# Patient Record
Sex: Female | Born: 1960 | Race: Black or African American | Hispanic: No | Smoking: Current some day smoker
Health system: Southern US, Community
[De-identification: ages and names within clinical notes are randomized; demographics above are authoritative.]

## PROBLEM LIST (undated history)

## (undated) DIAGNOSIS — J45909 Unspecified asthma, uncomplicated: Secondary | ICD-10-CM

## (undated) DIAGNOSIS — K219 Gastro-esophageal reflux disease without esophagitis: Secondary | ICD-10-CM

## (undated) DIAGNOSIS — I10 Essential (primary) hypertension: Secondary | ICD-10-CM

## (undated) DIAGNOSIS — T7840XA Allergy, unspecified, initial encounter: Secondary | ICD-10-CM

## (undated) DIAGNOSIS — N939 Abnormal uterine and vaginal bleeding, unspecified: Secondary | ICD-10-CM

## (undated) DIAGNOSIS — K3184 Gastroparesis: Secondary | ICD-10-CM

## (undated) DIAGNOSIS — F419 Anxiety disorder, unspecified: Secondary | ICD-10-CM

## (undated) DIAGNOSIS — E119 Type 2 diabetes mellitus without complications: Secondary | ICD-10-CM

## (undated) DIAGNOSIS — R739 Hyperglycemia, unspecified: Secondary | ICD-10-CM

## (undated) HISTORY — DX: Anxiety disorder, unspecified: F41.9

## (undated) HISTORY — PX: BREAST SURGERY: SHX581

## (undated) HISTORY — DX: Abnormal uterine and vaginal bleeding, unspecified: N93.9

## (undated) HISTORY — DX: Allergy, unspecified, initial encounter: T78.40XA

---

## 1997-07-30 ENCOUNTER — Other Ambulatory Visit: Admission: RE | Admit: 1997-07-30 | Discharge: 1997-07-30 | Payer: Self-pay | Admitting: Family Medicine

## 1997-08-08 ENCOUNTER — Other Ambulatory Visit: Admission: RE | Admit: 1997-08-08 | Discharge: 1997-08-08 | Payer: Self-pay | Admitting: Family Medicine

## 1998-02-01 ENCOUNTER — Emergency Department (HOSPITAL_COMMUNITY): Admission: EM | Admit: 1998-02-01 | Discharge: 1998-02-01 | Payer: Self-pay | Admitting: Emergency Medicine

## 1998-07-21 ENCOUNTER — Encounter: Admission: RE | Admit: 1998-07-21 | Discharge: 1998-10-19 | Payer: Self-pay

## 1999-09-21 ENCOUNTER — Ambulatory Visit (HOSPITAL_COMMUNITY): Admission: RE | Admit: 1999-09-21 | Discharge: 1999-09-21 | Payer: Self-pay | Admitting: *Deleted

## 2003-11-21 ENCOUNTER — Ambulatory Visit: Payer: Self-pay | Admitting: Family Medicine

## 2003-12-08 ENCOUNTER — Ambulatory Visit: Payer: Self-pay | Admitting: Family Medicine

## 2003-12-15 ENCOUNTER — Ambulatory Visit: Payer: Self-pay | Admitting: Family Medicine

## 2004-07-01 ENCOUNTER — Ambulatory Visit: Payer: Self-pay | Admitting: Family Medicine

## 2004-07-23 ENCOUNTER — Other Ambulatory Visit: Admission: RE | Admit: 2004-07-23 | Discharge: 2004-07-23 | Payer: Self-pay | Admitting: Family Medicine

## 2004-07-23 ENCOUNTER — Ambulatory Visit: Payer: Self-pay | Admitting: Family Medicine

## 2005-04-14 ENCOUNTER — Ambulatory Visit: Payer: Self-pay | Admitting: Family Medicine

## 2005-04-14 ENCOUNTER — Encounter (INDEPENDENT_AMBULATORY_CARE_PROVIDER_SITE_OTHER): Payer: Self-pay | Admitting: *Deleted

## 2005-04-14 ENCOUNTER — Other Ambulatory Visit: Admission: RE | Admit: 2005-04-14 | Discharge: 2005-04-14 | Payer: Self-pay | Admitting: Family Medicine

## 2005-04-25 ENCOUNTER — Ambulatory Visit: Payer: Self-pay | Admitting: Family Medicine

## 2005-06-01 ENCOUNTER — Other Ambulatory Visit: Admission: RE | Admit: 2005-06-01 | Discharge: 2005-06-01 | Payer: Self-pay | Admitting: *Deleted

## 2005-06-01 ENCOUNTER — Ambulatory Visit: Payer: Self-pay | Admitting: *Deleted

## 2005-06-22 ENCOUNTER — Ambulatory Visit (HOSPITAL_COMMUNITY): Admission: RE | Admit: 2005-06-22 | Discharge: 2005-06-22 | Payer: Self-pay | Admitting: Obstetrics and Gynecology

## 2005-06-22 ENCOUNTER — Ambulatory Visit: Payer: Self-pay | Admitting: *Deleted

## 2005-08-03 ENCOUNTER — Ambulatory Visit: Payer: Self-pay | Admitting: Internal Medicine

## 2005-09-05 ENCOUNTER — Ambulatory Visit: Payer: Self-pay | Admitting: Family Medicine

## 2005-10-26 ENCOUNTER — Ambulatory Visit: Payer: Self-pay | Admitting: Family Medicine

## 2005-10-28 ENCOUNTER — Ambulatory Visit: Payer: Self-pay | Admitting: Family Medicine

## 2005-11-16 ENCOUNTER — Other Ambulatory Visit: Admission: RE | Admit: 2005-11-16 | Discharge: 2005-11-16 | Payer: Self-pay | Admitting: Obstetrics & Gynecology

## 2005-11-16 ENCOUNTER — Ambulatory Visit: Payer: Self-pay | Admitting: *Deleted

## 2005-11-28 ENCOUNTER — Encounter (INDEPENDENT_AMBULATORY_CARE_PROVIDER_SITE_OTHER): Payer: Self-pay | Admitting: Family Medicine

## 2005-12-01 ENCOUNTER — Ambulatory Visit: Payer: Self-pay | Admitting: Obstetrics and Gynecology

## 2005-12-05 ENCOUNTER — Ambulatory Visit: Payer: Self-pay | Admitting: Family Medicine

## 2006-01-12 ENCOUNTER — Ambulatory Visit: Payer: Self-pay | Admitting: Family Medicine

## 2006-08-11 ENCOUNTER — Encounter (INDEPENDENT_AMBULATORY_CARE_PROVIDER_SITE_OTHER): Payer: Self-pay | Admitting: Family Medicine

## 2006-08-11 DIAGNOSIS — G479 Sleep disorder, unspecified: Secondary | ICD-10-CM | POA: Insufficient documentation

## 2006-08-11 DIAGNOSIS — J45909 Unspecified asthma, uncomplicated: Secondary | ICD-10-CM

## 2006-08-11 DIAGNOSIS — B977 Papillomavirus as the cause of diseases classified elsewhere: Secondary | ICD-10-CM

## 2006-08-11 DIAGNOSIS — K219 Gastro-esophageal reflux disease without esophagitis: Secondary | ICD-10-CM

## 2006-08-11 DIAGNOSIS — I10 Essential (primary) hypertension: Secondary | ICD-10-CM

## 2006-08-11 DIAGNOSIS — E119 Type 2 diabetes mellitus without complications: Secondary | ICD-10-CM

## 2006-08-11 DIAGNOSIS — F3289 Other specified depressive episodes: Secondary | ICD-10-CM | POA: Insufficient documentation

## 2006-08-11 DIAGNOSIS — F411 Generalized anxiety disorder: Secondary | ICD-10-CM

## 2006-08-11 DIAGNOSIS — J309 Allergic rhinitis, unspecified: Secondary | ICD-10-CM | POA: Insufficient documentation

## 2006-08-11 DIAGNOSIS — F329 Major depressive disorder, single episode, unspecified: Secondary | ICD-10-CM | POA: Insufficient documentation

## 2006-09-04 ENCOUNTER — Ambulatory Visit: Payer: Self-pay | Admitting: Family Medicine

## 2006-10-11 ENCOUNTER — Encounter (INDEPENDENT_AMBULATORY_CARE_PROVIDER_SITE_OTHER): Payer: Self-pay | Admitting: Family Medicine

## 2006-10-11 ENCOUNTER — Ambulatory Visit: Payer: Self-pay | Admitting: Internal Medicine

## 2006-10-11 LAB — CONVERTED CEMR LAB
BUN: 12 mg/dL (ref 6–23)
CO2: 25 meq/L (ref 19–32)
Calcium: 9.1 mg/dL (ref 8.4–10.5)
Chloride: 104 meq/L (ref 96–112)
Glucose, Bld: 152 mg/dL — ABNORMAL HIGH (ref 70–99)
Potassium: 3.6 meq/L (ref 3.5–5.3)
Sodium: 141 meq/L (ref 135–145)

## 2007-01-19 ENCOUNTER — Ambulatory Visit: Payer: Self-pay | Admitting: Internal Medicine

## 2007-05-03 ENCOUNTER — Encounter (INDEPENDENT_AMBULATORY_CARE_PROVIDER_SITE_OTHER): Payer: Self-pay | Admitting: Family Medicine

## 2007-05-03 ENCOUNTER — Ambulatory Visit: Payer: Self-pay | Admitting: Internal Medicine

## 2007-05-03 LAB — CONVERTED CEMR LAB: Chlamydia, Swab/Urine, PCR: NEGATIVE

## 2007-05-29 ENCOUNTER — Ambulatory Visit: Payer: Self-pay | Admitting: Internal Medicine

## 2007-05-31 ENCOUNTER — Ambulatory Visit: Payer: Self-pay | Admitting: Internal Medicine

## 2007-09-03 ENCOUNTER — Encounter: Payer: Self-pay | Admitting: Family Medicine

## 2007-09-03 ENCOUNTER — Other Ambulatory Visit: Admission: RE | Admit: 2007-09-03 | Discharge: 2007-09-03 | Payer: Self-pay | Admitting: Family Medicine

## 2007-09-03 ENCOUNTER — Ambulatory Visit: Payer: Self-pay | Admitting: Internal Medicine

## 2007-09-03 LAB — CONVERTED CEMR LAB: GC Probe Amp, Genital: NEGATIVE

## 2007-10-10 ENCOUNTER — Encounter: Payer: Self-pay | Admitting: Family Medicine

## 2007-10-10 ENCOUNTER — Ambulatory Visit: Payer: Self-pay | Admitting: Internal Medicine

## 2007-10-10 LAB — CONVERTED CEMR LAB
AST: 12 units/L (ref 0–37)
Alkaline Phosphatase: 38 units/L — ABNORMAL LOW (ref 39–117)
BUN: 19 mg/dL (ref 6–23)
Basophils Relative: 0 % (ref 0–1)
Calcium: 9.3 mg/dL (ref 8.4–10.5)
Chloride: 105 meq/L (ref 96–112)
Eosinophils Absolute: 0.2 10*3/uL (ref 0.0–0.7)
Eosinophils Relative: 3 % (ref 0–5)
FSH: 11.1 milliintl units/mL
Glucose, Bld: 145 mg/dL — ABNORMAL HIGH (ref 70–99)
HDL: 50 mg/dL (ref >39)
LDL Cholesterol: 70 mg/dL (ref 0–99)
Lymphs Abs: 2.9 10*3/uL (ref 0.7–4.0)
Monocytes Absolute: 0.4 10*3/uL (ref 0.1–1.0)
Monocytes Relative: 8 % (ref 3–12)
Neutro Abs: 2.1 10*3/uL (ref 1.7–7.7)
Neutrophils Relative %: 37 % — ABNORMAL LOW (ref 43–77)
Sodium: 141 meq/L (ref 135–145)
TSH: 1.344 microintl units/mL (ref 0.350–4.50)
Total CHOL/HDL Ratio: 3.1
Total Protein: 7.2 g/dL (ref 6.0–8.3)
VLDL: 36 mg/dL (ref 0–40)

## 2007-10-17 ENCOUNTER — Ambulatory Visit: Payer: Self-pay | Admitting: Internal Medicine

## 2007-11-07 ENCOUNTER — Encounter: Payer: Self-pay | Admitting: Family Medicine

## 2007-11-07 ENCOUNTER — Ambulatory Visit: Payer: Self-pay | Admitting: Internal Medicine

## 2007-11-07 LAB — CONVERTED CEMR LAB
Lymphocytes Relative: 19 % (ref 12–46)
MCV: 79.1 fL (ref 78.0–100.0)
Monocytes Absolute: 0.6 10*3/uL (ref 0.1–1.0)
Neutrophils Relative %: 63 % (ref 43–77)
RBC: 4.49 M/uL (ref 3.87–5.11)
RDW: 18.2 % — ABNORMAL HIGH (ref 11.5–15.5)
WBC: 3.6 10*3/uL — ABNORMAL LOW (ref 4.0–10.5)

## 2007-11-28 ENCOUNTER — Ambulatory Visit: Payer: Self-pay | Admitting: Family Medicine

## 2007-12-06 ENCOUNTER — Ambulatory Visit: Payer: Self-pay | Admitting: Family Medicine

## 2008-01-15 ENCOUNTER — Encounter: Payer: Self-pay | Admitting: Family Medicine

## 2008-01-15 ENCOUNTER — Ambulatory Visit: Payer: Self-pay | Admitting: Internal Medicine

## 2008-01-15 LAB — CONVERTED CEMR LAB
Eosinophils Absolute: 0.2 10*3/uL (ref 0.0–0.7)
Hemoglobin: 10.8 g/dL — ABNORMAL LOW (ref 12.0–15.0)
MCHC: 30.7 g/dL (ref 30.0–36.0)
Monocytes Absolute: 0.6 10*3/uL (ref 0.1–1.0)
Monocytes Relative: 7 % (ref 3–12)
Neutrophils Relative %: 44 % (ref 43–77)
Platelets: 403 10*3/uL — ABNORMAL HIGH (ref 150–400)
RBC: 4.38 M/uL (ref 3.87–5.11)

## 2008-01-29 ENCOUNTER — Ambulatory Visit (HOSPITAL_BASED_OUTPATIENT_CLINIC_OR_DEPARTMENT_OTHER): Admission: RE | Admit: 2008-01-29 | Discharge: 2008-01-29 | Payer: Self-pay | Admitting: Internal Medicine

## 2008-02-03 ENCOUNTER — Ambulatory Visit: Payer: Self-pay | Admitting: Internal Medicine

## 2008-02-20 ENCOUNTER — Ambulatory Visit: Payer: Self-pay | Admitting: Family Medicine

## 2008-03-13 ENCOUNTER — Ambulatory Visit: Payer: Self-pay | Admitting: Internal Medicine

## 2008-04-30 ENCOUNTER — Ambulatory Visit: Payer: Self-pay | Admitting: Internal Medicine

## 2008-04-30 ENCOUNTER — Encounter: Payer: Self-pay | Admitting: Family Medicine

## 2008-04-30 LAB — CONVERTED CEMR LAB: Microalb, Ur: 2.76 mg/dL — ABNORMAL HIGH (ref 0.00–1.89)

## 2008-05-07 ENCOUNTER — Ambulatory Visit (HOSPITAL_COMMUNITY): Admission: RE | Admit: 2008-05-07 | Discharge: 2008-05-07 | Payer: Self-pay | Admitting: Family Medicine

## 2008-05-19 ENCOUNTER — Ambulatory Visit: Payer: Self-pay | Admitting: Family Medicine

## 2008-06-30 ENCOUNTER — Ambulatory Visit: Payer: Self-pay | Admitting: Internal Medicine

## 2008-07-29 ENCOUNTER — Encounter: Payer: Self-pay | Admitting: Family Medicine

## 2008-07-29 ENCOUNTER — Ambulatory Visit: Payer: Self-pay | Admitting: Internal Medicine

## 2008-07-29 LAB — CONVERTED CEMR LAB
CO2: 28 meq/L (ref 19–32)
Calcium: 9.2 mg/dL (ref 8.4–10.5)
Chloride: 100 meq/L (ref 96–112)
Creatinine, Ser: 0.88 mg/dL (ref 0.40–1.20)
Eosinophils Relative: 2 % (ref 0–5)
Glucose, Bld: 127 mg/dL — ABNORMAL HIGH (ref 70–99)
HCT: 41.2 % (ref 36.0–46.0)
Hemoglobin: 11.8 g/dL — ABNORMAL LOW (ref 12.0–15.0)
Lymphocytes Relative: 52 % — ABNORMAL HIGH (ref 12–46)
MCHC: 28.6 g/dL — ABNORMAL LOW (ref 30.0–36.0)
MCV: 89.6 fL (ref 78.0–100.0)
Microalb, Ur: 0.96 mg/dL (ref 0.00–1.89)
Neutrophils Relative %: 38 % — ABNORMAL LOW (ref 43–77)
RBC: 4.6 M/uL (ref 3.87–5.11)
Total Bilirubin: 0.4 mg/dL (ref 0.3–1.2)
Total Protein: 7.3 g/dL (ref 6.0–8.3)
WBC: 5.1 10*3/uL (ref 4.0–10.5)

## 2008-08-04 ENCOUNTER — Ambulatory Visit: Payer: Self-pay | Admitting: Internal Medicine

## 2009-01-02 ENCOUNTER — Ambulatory Visit: Payer: Self-pay | Admitting: Family Medicine

## 2009-03-25 ENCOUNTER — Encounter (INDEPENDENT_AMBULATORY_CARE_PROVIDER_SITE_OTHER): Payer: Self-pay | Admitting: Adult Health

## 2009-03-25 ENCOUNTER — Ambulatory Visit: Payer: Self-pay | Admitting: Family Medicine

## 2009-03-25 LAB — CONVERTED CEMR LAB
ALT: 24 units/L (ref 0–35)
AST: 27 units/L (ref 0–37)
BUN: 13 mg/dL (ref 6–23)
Basophils Absolute: 0 10*3/uL (ref 0.0–0.1)
Cholesterol: 217 mg/dL — ABNORMAL HIGH (ref 0–200)
Eosinophils Absolute: 0.1 10*3/uL (ref 0.0–0.7)
Helicobacter Pylori Antibody-IgG: <0.4
Hemoglobin: 14.9 g/dL (ref 12.0–15.0)
LDL Cholesterol: 145 mg/dL — ABNORMAL HIGH (ref 0–99)
Lymphocytes Relative: 45 % (ref 12–46)
MCHC: 33.2 g/dL (ref 30.0–36.0)
MCV: 91.8 fL (ref 78.0–100.0)
Monocytes Absolute: 0.5 10*3/uL (ref 0.1–1.0)
Monocytes Relative: 11 % (ref 3–12)
Neutrophils Relative %: 42 % — ABNORMAL LOW (ref 43–77)
Potassium: 3.4 meq/L — ABNORMAL LOW (ref 3.5–5.3)
RBC: 4.89 M/uL (ref 3.87–5.11)
Sodium: 141 meq/L (ref 135–145)
Total CHOL/HDL Ratio: 4.8
Total Protein: 7.3 g/dL (ref 6.0–8.3)
WBC: 4.2 10*3/uL (ref 4.0–10.5)

## 2009-03-26 ENCOUNTER — Encounter (INDEPENDENT_AMBULATORY_CARE_PROVIDER_SITE_OTHER): Payer: Self-pay | Admitting: Adult Health

## 2009-03-27 ENCOUNTER — Encounter (INDEPENDENT_AMBULATORY_CARE_PROVIDER_SITE_OTHER): Payer: Self-pay | Admitting: Adult Health

## 2009-07-17 ENCOUNTER — Ambulatory Visit: Payer: Self-pay | Admitting: Family Medicine

## 2009-07-17 ENCOUNTER — Encounter (INDEPENDENT_AMBULATORY_CARE_PROVIDER_SITE_OTHER): Payer: Self-pay | Admitting: Adult Health

## 2009-09-15 ENCOUNTER — Other Ambulatory Visit: Admission: RE | Admit: 2009-09-15 | Discharge: 2009-09-15 | Payer: Self-pay | Admitting: Adult Health

## 2009-09-15 ENCOUNTER — Ambulatory Visit: Payer: Self-pay | Admitting: Internal Medicine

## 2009-09-15 ENCOUNTER — Encounter (INDEPENDENT_AMBULATORY_CARE_PROVIDER_SITE_OTHER): Payer: Self-pay | Admitting: Adult Health

## 2009-09-15 LAB — CONVERTED CEMR LAB
AST: 25 units/L (ref 0–37)
Alkaline Phosphatase: 59 units/L (ref 39–117)
Basophils Absolute: 0 10*3/uL (ref 0.0–0.1)
Basophils Relative: 0 % (ref 0–1)
CO2: 30 meq/L (ref 19–32)
Calcium: 9.4 mg/dL (ref 8.4–10.5)
Chlamydia, DNA Probe: NEGATIVE
Creatinine, Ser: 1.04 mg/dL (ref 0.40–1.20)
Eosinophils Absolute: 0.1 10*3/uL (ref 0.0–0.7)
Eosinophils Relative: 2 % (ref 0–5)
Glucose, Bld: 131 mg/dL — ABNORMAL HIGH (ref 70–99)
LH: 34.6 milliintl units/mL
Lymphocytes Relative: 50 % — ABNORMAL HIGH (ref 12–46)
MCHC: 31.9 g/dL (ref 30.0–36.0)
Monocytes Absolute: 0.4 10*3/uL (ref 0.1–1.0)
Neutrophils Relative %: 41 % — ABNORMAL LOW (ref 43–77)
Platelets: 271 10*3/uL (ref 150–400)
RBC: 4.77 M/uL (ref 3.87–5.11)
TSH: 0.472 microintl units/mL (ref 0.350–4.500)
Total Protein: 7.5 g/dL (ref 6.0–8.3)
VLDL: 31 mg/dL (ref 0–40)

## 2009-09-28 ENCOUNTER — Ambulatory Visit (HOSPITAL_COMMUNITY): Admission: RE | Admit: 2009-09-28 | Discharge: 2009-09-28 | Payer: Self-pay | Admitting: Internal Medicine

## 2009-10-08 ENCOUNTER — Encounter: Admission: RE | Admit: 2009-10-08 | Discharge: 2009-10-08 | Payer: Self-pay | Admitting: Internal Medicine

## 2009-10-12 ENCOUNTER — Ambulatory Visit: Payer: Self-pay | Admitting: Family Medicine

## 2009-10-14 ENCOUNTER — Ambulatory Visit: Payer: Self-pay | Admitting: Internal Medicine

## 2009-10-29 ENCOUNTER — Ambulatory Visit: Payer: Self-pay | Admitting: Internal Medicine

## 2009-10-29 ENCOUNTER — Encounter (INDEPENDENT_AMBULATORY_CARE_PROVIDER_SITE_OTHER): Payer: Self-pay | Admitting: Family Medicine

## 2009-10-29 LAB — CONVERTED CEMR LAB
ALT: 15 units/L (ref 0–35)
Albumin: 4.4 g/dL (ref 3.5–5.2)
BUN: 13 mg/dL (ref 6–23)
CO2: 31 meq/L (ref 19–32)
Calcium: 8.9 mg/dL (ref 8.4–10.5)
Chloride: 101 meq/L (ref 96–112)
Cholesterol: 204 mg/dL — ABNORMAL HIGH (ref 0–200)
Creatinine, Ser: 0.82 mg/dL (ref 0.40–1.20)
Glucose, Bld: 182 mg/dL — ABNORMAL HIGH (ref 70–99)
HDL: 42 mg/dL (ref >39)
LDL Cholesterol: 136 mg/dL — ABNORMAL HIGH (ref 0–99)
Sodium: 143 meq/L (ref 135–145)
Total Bilirubin: 0.5 mg/dL (ref 0.3–1.2)

## 2009-11-12 ENCOUNTER — Encounter: Payer: Self-pay | Admitting: Obstetrics and Gynecology

## 2009-11-12 ENCOUNTER — Ambulatory Visit: Payer: Self-pay | Admitting: Obstetrics & Gynecology

## 2009-11-12 LAB — CONVERTED CEMR LAB: GC Probe Amp, Genital: NEGATIVE

## 2009-11-13 ENCOUNTER — Encounter: Payer: Self-pay | Admitting: Obstetrics and Gynecology

## 2009-11-13 LAB — CONVERTED CEMR LAB: Yeast Wet Prep HPF POC: NONE SEEN

## 2009-11-23 ENCOUNTER — Encounter (INDEPENDENT_AMBULATORY_CARE_PROVIDER_SITE_OTHER): Payer: Self-pay | Admitting: *Deleted

## 2009-11-23 LAB — CONVERTED CEMR LAB
AST: 22 units/L (ref 0–37)
BUN: 13 mg/dL (ref 6–23)
Glucose, Bld: 167 mg/dL — ABNORMAL HIGH (ref 70–99)
Potassium: 2.8 meq/L — ABNORMAL LOW (ref 3.5–5.3)
Total Bilirubin: 0.4 mg/dL (ref 0.3–1.2)
Total Protein: 7.2 g/dL (ref 6.0–8.3)

## 2009-12-09 ENCOUNTER — Encounter (INDEPENDENT_AMBULATORY_CARE_PROVIDER_SITE_OTHER): Payer: Self-pay | Admitting: *Deleted

## 2009-12-09 LAB — CONVERTED CEMR LAB
BUN: 14 mg/dL (ref 6–23)
CO2: 31 meq/L (ref 19–32)
Calcium: 9.6 mg/dL (ref 8.4–10.5)
Cholesterol: 125 mg/dL (ref 0–200)
Glucose, Bld: 151 mg/dL — ABNORMAL HIGH (ref 70–99)
LDL Cholesterol: 62 mg/dL (ref 0–99)
Potassium: 3.9 meq/L (ref 3.5–5.3)
Sodium: 143 meq/L (ref 135–145)
Total CHOL/HDL Ratio: 2.8
Triglycerides: 88 mg/dL (ref <150)
VLDL: 18 mg/dL (ref 0–40)

## 2010-03-21 ENCOUNTER — Encounter: Payer: Self-pay | Admitting: *Deleted

## 2010-03-21 ENCOUNTER — Encounter: Payer: Self-pay | Admitting: Internal Medicine

## 2010-03-21 ENCOUNTER — Encounter: Payer: Self-pay | Admitting: Obstetrics and Gynecology

## 2010-06-10 ENCOUNTER — Emergency Department (HOSPITAL_COMMUNITY)
Admission: EM | Admit: 2010-06-10 | Discharge: 2010-06-11 | Disposition: A | Discharge status: Home / Self Care | Payer: Medicaid Other | Attending: Emergency Medicine | Admitting: Emergency Medicine

## 2010-06-10 ENCOUNTER — Emergency Department (HOSPITAL_COMMUNITY): Payer: Medicaid Other

## 2010-06-10 DIAGNOSIS — R509 Fever, unspecified: Secondary | ICD-10-CM | POA: Insufficient documentation

## 2010-06-10 DIAGNOSIS — B9789 Other viral agents as the cause of diseases classified elsewhere: Secondary | ICD-10-CM | POA: Insufficient documentation

## 2010-06-10 DIAGNOSIS — I10 Essential (primary) hypertension: Secondary | ICD-10-CM | POA: Insufficient documentation

## 2010-06-10 DIAGNOSIS — R0989 Other specified symptoms and signs involving the circulatory and respiratory systems: Secondary | ICD-10-CM | POA: Insufficient documentation

## 2010-06-10 DIAGNOSIS — K219 Gastro-esophageal reflux disease without esophagitis: Secondary | ICD-10-CM | POA: Insufficient documentation

## 2010-06-10 DIAGNOSIS — R109 Unspecified abdominal pain: Secondary | ICD-10-CM | POA: Insufficient documentation

## 2010-06-10 DIAGNOSIS — R059 Cough, unspecified: Secondary | ICD-10-CM | POA: Insufficient documentation

## 2010-06-10 DIAGNOSIS — R05 Cough: Secondary | ICD-10-CM | POA: Insufficient documentation

## 2010-06-10 DIAGNOSIS — R0609 Other forms of dyspnea: Secondary | ICD-10-CM | POA: Insufficient documentation

## 2010-06-10 DIAGNOSIS — E119 Type 2 diabetes mellitus without complications: Secondary | ICD-10-CM | POA: Insufficient documentation

## 2010-06-10 DIAGNOSIS — R1110 Vomiting, unspecified: Secondary | ICD-10-CM | POA: Insufficient documentation

## 2010-06-10 DIAGNOSIS — E7800 Pure hypercholesterolemia, unspecified: Secondary | ICD-10-CM | POA: Insufficient documentation

## 2010-06-10 LAB — COMPREHENSIVE METABOLIC PANEL
ALT: 20 U/L (ref 0–35)
AST: 21 U/L (ref 0–37)
Albumin: 4 g/dL (ref 3.5–5.2)
BUN: 11 mg/dL (ref 6–23)
CO2: 30 mEq/L (ref 19–32)
Chloride: 93 mEq/L — ABNORMAL LOW (ref 96–112)
GFR calc non Af Amer: 54 mL/min — ABNORMAL LOW (ref >60)
Glucose, Bld: 159 mg/dL — ABNORMAL HIGH (ref 70–99)
Potassium: 3.2 mEq/L — ABNORMAL LOW (ref 3.5–5.1)
Total Protein: 7.3 g/dL (ref 6.0–8.3)

## 2010-06-10 LAB — URINALYSIS, ROUTINE W REFLEX MICROSCOPIC
Bilirubin Urine: NEGATIVE
Glucose, UA: NEGATIVE mg/dL
Ketones, ur: NEGATIVE mg/dL
Nitrite: NEGATIVE
Protein, ur: NEGATIVE mg/dL
Urobilinogen, UA: 0.2 mg/dL (ref 0.0–1.0)
pH: 5.5 (ref 5.0–8.0)

## 2010-06-10 LAB — URINE MICROSCOPIC-ADD ON

## 2010-06-10 LAB — CBC
HCT: 35.6 % — ABNORMAL LOW (ref 36.0–46.0)
MCH: 30.5 pg (ref 26.0–34.0)
MCV: 89.7 fL (ref 78.0–100.0)
Platelets: 228 10*3/uL (ref 150–400)
RBC: 3.97 MIL/uL (ref 3.87–5.11)
WBC: 13 10*3/uL — ABNORMAL HIGH (ref 4.0–10.5)

## 2010-06-10 LAB — DIFFERENTIAL
Basophils Absolute: 0 10*3/uL (ref 0.0–0.1)
Monocytes Relative: 5 % (ref 3–12)

## 2010-06-12 LAB — URINE CULTURE: Culture  Setup Time: 201204122319

## 2010-07-13 NOTE — Procedures (Signed)
NAME:  Kelly Santana, Kelly Santana               ACCOUNT NO.:  [id number]   MEDICAL RECORD NO.:  [id number]          PATIENT TYPE:  OUT   LOCATION:  SLEEP CENTER                 FACILITY:  [facility]   PHYSICIAN:  Clinton D. Maple Hudson, MD, FCCP, FACPDATE OF BIRTH:  04/27/1961   DATE OF STUDY:  01/29/2008                            NOCTURNAL POLYSOMNOGRAM   REFERRING PHYSICIAN:  Sharin Grave, MD   REFERRING PHYSICIAN:  Sharin Grave, MD   INDICATION FOR STUDY:  Insomnia with sleep apnea.   EPWORTH SLEEPINESS SCORE:  11/24.  BMI 31.8.  Weight 185 pounds.  Height  64 inches.  Neck 15 inches.   HOME MEDICATIONS:  Charted and reviewed.   SLEEP ARCHITECTURE:  Total sleep time 343 minutes with sleep efficiency  84.3%.  Stage I was 7.9%.  Stage II 69.9%.  Stage III absent.  REM 22.2%  of total sleep time.  Sleep latency 13 minutes.  REM latency 75 minutes.  Awake after sleep onset 50 minutes.  Arousal index 3.7.   RESPIRATORY DATA:  Apnea-hypopnea index (AHI) 4.9 per hour.  A total of  28 events were scored, all as hypopneas.  Most events were while  sleeping supine.  REM AHI 16.5.  There were insufficient events to  permit CPAP titration by split-study protocol on the study night.   OXYGEN DATA:  Loud snoring with oxygen desaturation to a nadir of 82%.  Mean oxygenation saturation through the study 94.9% on room air.   CARDIAC DATA:  Normal sinus rhythm.   MOVEMENT/PARASOMNIA:  No significant limb movement disturbance.  Bathroom x1.  She slept without her mouthguard and complained of  headache upon waking in the morning.   IMPRESSION/RECOMMENDATION:  1. Occasional respiratory events with sleep disturbance, apnea-      hypopnea index 4.9 per hour (normal range 0-5 per hour).  Events      were more common while sleeping supine, suggesting benefit from      encouragement to sleep on flat back.  Snoring was loud with oxygen      desaturation to a nadir of 82%.  No significant time was recorded    with oxygen saturation less than 88% on room air.  2. She states that she usually wears a mouthguard, but did not wear it      on the study night and stated consequently she woke with headache      in the morning.      Clinton D. Maple Hudson, MD, [facility], FACP  Diplomate, Biomedical engineer of Sleep Medicine  Electronically Signed     CDY/MEDQ  D:  02/03/2008 14:27:51  T:  02/04/2008 00:39:19  Job:  [id number]

## 2010-07-16 NOTE — Group Therapy Note (Signed)
NAME:  Kelly Santana, Kelly Santana NO.:  [id number]   MEDICAL RECORD NO.:  [id number]          PATIENT TYPE:  WOC   LOCATION:  [facility]                   FACILITY:  [facility]   PHYSICIAN:  Carolanne Grumbling, M.D.   DATE OF BIRTH:  11-15-1960   DATE OF SERVICE:                                    CLINIC NOTE   PREOPERATIVE DIAGNOSIS:  Cervical dysplasia.   POSTOPERATIVE DIAGNOSIS:  Cervical dysplasia.   PROCEDURE:  Colposcopy with LEEP.   SURGEON:  1. Carolanne Grumbling, M.D.  2. Elsie Lincoln, M.D.   ESTIMATED BLOOD LOSS:  Minimal.   ANESTHESIA:  Paracervical block done with 10 ml of Lidocaine with  epinephrine.   COMPLICATIONS:  None.   INDICATIONS FOR PROCEDURE:  A 50 year old G4, P3-0-1-3, underwent colposcopy  in April 2004 that showed a lesion at 11:00 o'clock that extended into the  endocervix. Pap smear prior to that, in February, had been ASCUS with high  risk HPV types. 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  The patient also has a history of having cryotherapy in 1995. The patient  was counseled about the risks and benefits and wanted to proceed with an  excisional procedure.   DESCRIPTION OF PROCEDURE:  The patient was placed in stirrups. Speculum  placed. Entire cervix visualized. Acetowhite changes noted at 9 o'clock to  11 o'clock  __________the endocervix. Paracervical block done, injecting at  3, 5, 7, and 9 o'clock, approximately 2.5 cc per site. Medium loop used.  Specimen placed in formalin. Area was also __________. Hemostasis obtained  with Monsel's. The patient tolerated the procedure. No complications.           ______________________________  Carolanne Grumbling, M.D.     TW/MEDQ  D:  11/16/2005  T:  11/17/2005  Job:  [id number]

## 2010-10-15 ENCOUNTER — Other Ambulatory Visit (HOSPITAL_COMMUNITY)
Admission: RE | Admit: 2010-10-15 | Discharge: 2010-10-15 | Disposition: A | Discharge status: Home / Self Care | Payer: Medicaid Other | Source: Ambulatory Visit | Attending: Internal Medicine | Admitting: Internal Medicine

## 2010-10-15 ENCOUNTER — Other Ambulatory Visit: Payer: Self-pay | Admitting: Family Medicine

## 2010-10-15 DIAGNOSIS — Z01419 Encounter for gynecological examination (general) (routine) without abnormal findings: Secondary | ICD-10-CM | POA: Insufficient documentation

## 2010-10-28 ENCOUNTER — Inpatient Hospital Stay (INDEPENDENT_AMBULATORY_CARE_PROVIDER_SITE_OTHER)
Admission: RE | Admit: 2010-10-28 | Discharge: 2010-10-28 | Disposition: A | Discharge status: Home / Self Care | Payer: Medicaid Other | Source: Ambulatory Visit | Attending: Family Medicine | Admitting: Family Medicine

## 2010-10-28 DIAGNOSIS — M1990 Unspecified osteoarthritis, unspecified site: Secondary | ICD-10-CM

## 2010-10-28 DIAGNOSIS — I10 Essential (primary) hypertension: Secondary | ICD-10-CM

## 2010-11-11 ENCOUNTER — Inpatient Hospital Stay (INDEPENDENT_AMBULATORY_CARE_PROVIDER_SITE_OTHER)
Admission: RE | Admit: 2010-11-11 | Discharge: 2010-11-11 | Disposition: A | Discharge status: Home / Self Care | Payer: Medicaid Other | Source: Ambulatory Visit | Attending: Family Medicine | Admitting: Family Medicine

## 2010-11-11 DIAGNOSIS — N760 Acute vaginitis: Secondary | ICD-10-CM

## 2010-11-11 LAB — WET PREP, GENITAL: Yeast Wet Prep HPF POC: NONE SEEN

## 2010-11-26 ENCOUNTER — Inpatient Hospital Stay (INDEPENDENT_AMBULATORY_CARE_PROVIDER_SITE_OTHER)
Admission: RE | Admit: 2010-11-26 | Discharge: 2010-11-26 | Disposition: A | Discharge status: Home / Self Care | Payer: Medicaid Other | Source: Ambulatory Visit | Attending: Family Medicine | Admitting: Family Medicine

## 2010-11-26 DIAGNOSIS — B373 Candidiasis of vulva and vagina: Secondary | ICD-10-CM

## 2010-11-27 ENCOUNTER — Emergency Department (HOSPITAL_COMMUNITY)
Admission: EM | Admit: 2010-11-27 | Discharge: 2010-11-27 | Disposition: A | Discharge status: Home / Self Care | Payer: Medicaid Other | Attending: Emergency Medicine | Admitting: Emergency Medicine

## 2010-11-27 DIAGNOSIS — E7800 Pure hypercholesterolemia, unspecified: Secondary | ICD-10-CM | POA: Insufficient documentation

## 2010-11-27 DIAGNOSIS — Z79899 Other long term (current) drug therapy: Secondary | ICD-10-CM | POA: Insufficient documentation

## 2010-11-27 DIAGNOSIS — F17200 Nicotine dependence, unspecified, uncomplicated: Secondary | ICD-10-CM | POA: Insufficient documentation

## 2010-11-27 DIAGNOSIS — K219 Gastro-esophageal reflux disease without esophagitis: Secondary | ICD-10-CM | POA: Insufficient documentation

## 2010-11-27 DIAGNOSIS — E119 Type 2 diabetes mellitus without complications: Secondary | ICD-10-CM | POA: Insufficient documentation

## 2010-11-27 DIAGNOSIS — I10 Essential (primary) hypertension: Secondary | ICD-10-CM | POA: Insufficient documentation

## 2010-11-27 DIAGNOSIS — L299 Pruritus, unspecified: Secondary | ICD-10-CM | POA: Insufficient documentation

## 2010-11-27 DIAGNOSIS — H109 Unspecified conjunctivitis: Secondary | ICD-10-CM | POA: Insufficient documentation

## 2011-04-17 IMAGING — US US TRANSVAGINAL NON-OB
1 series · 14 of 25 positions shown · non-contrast
Comparison: None

CLINICAL DATA: Abnormal vaginal bleeding.  Prior C-section.
Perimenopausal bleeding.  LMP was 08/30/2009



[Series 1: us transvaginal non-ob · 0.27mm/px · 14 of 46 slices shown]
[im 1/46]
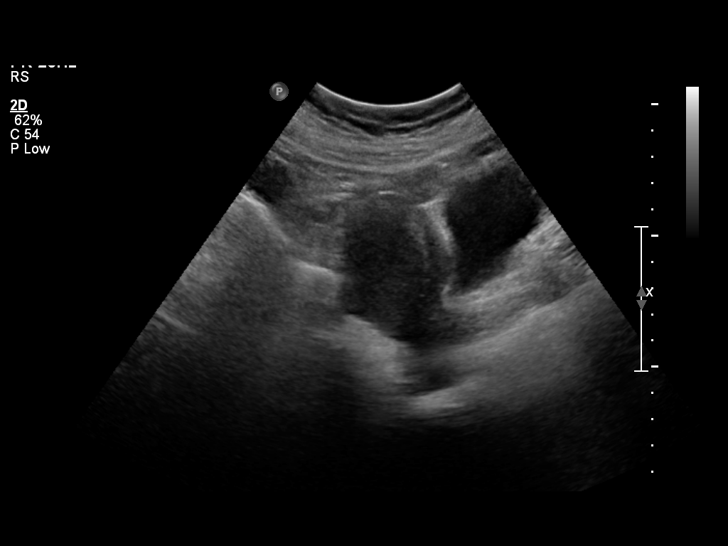
[im 4/46]
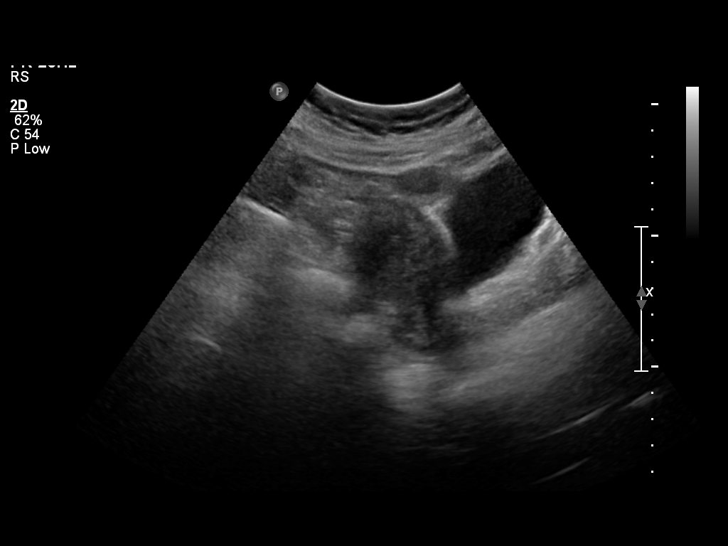
[im 8/46]
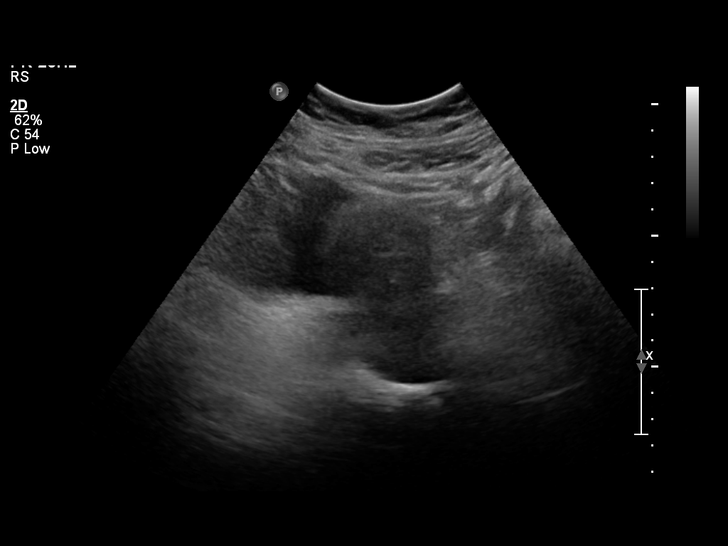
[im 12/46]
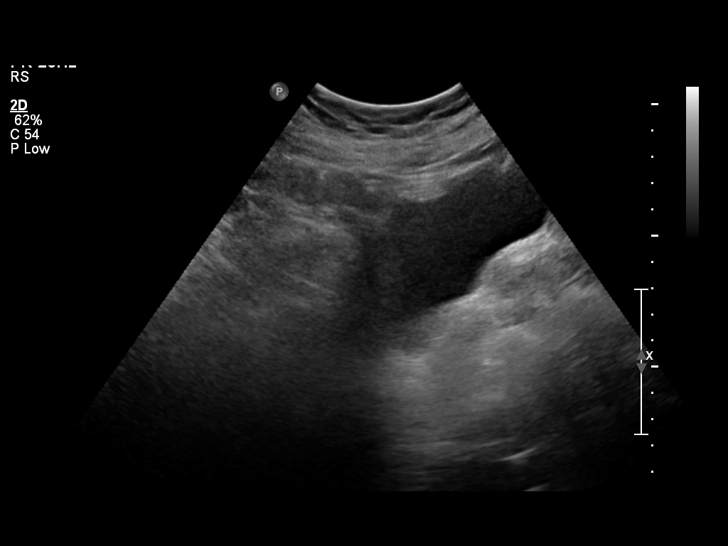
[im 16/46]
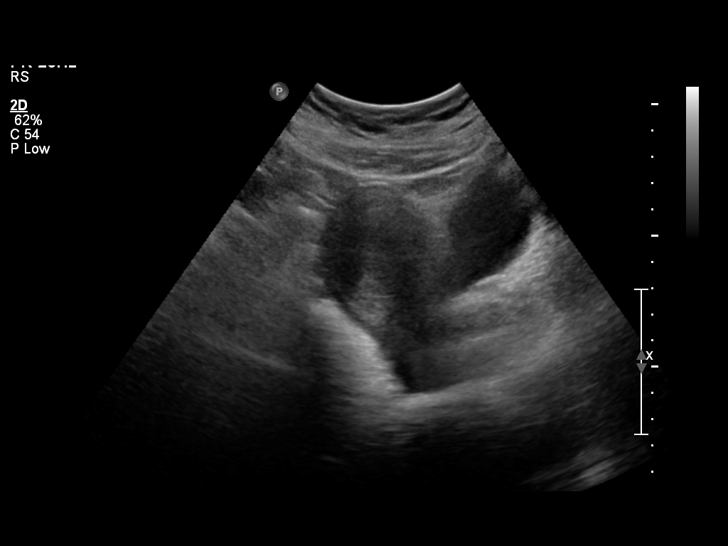
[im 17/46]
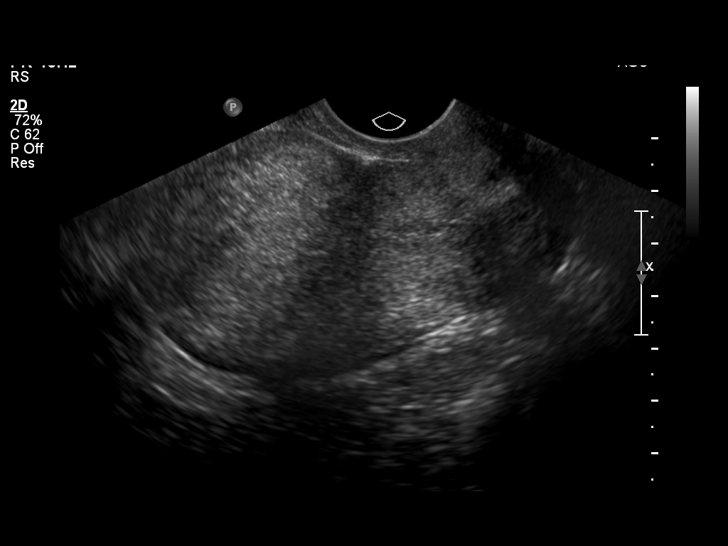
[im 21/46]
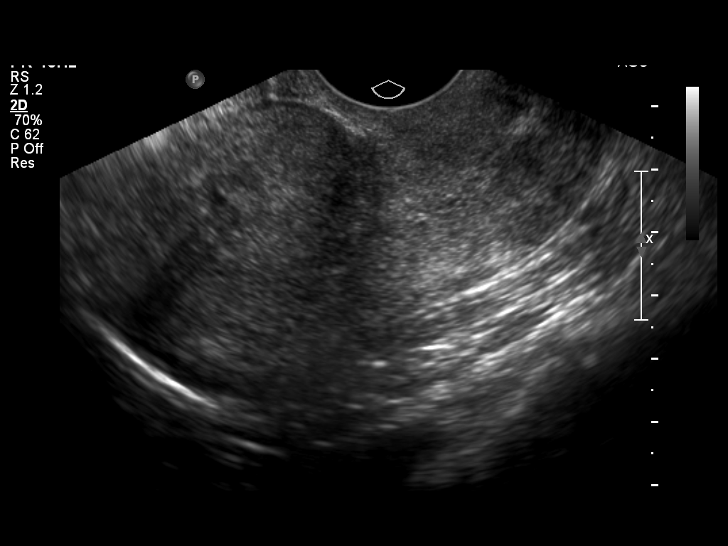
[im 25/46]
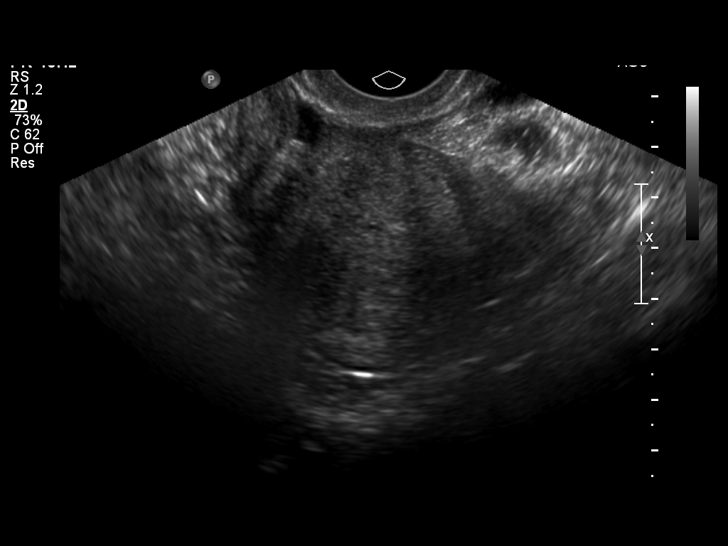
[im 29/46]
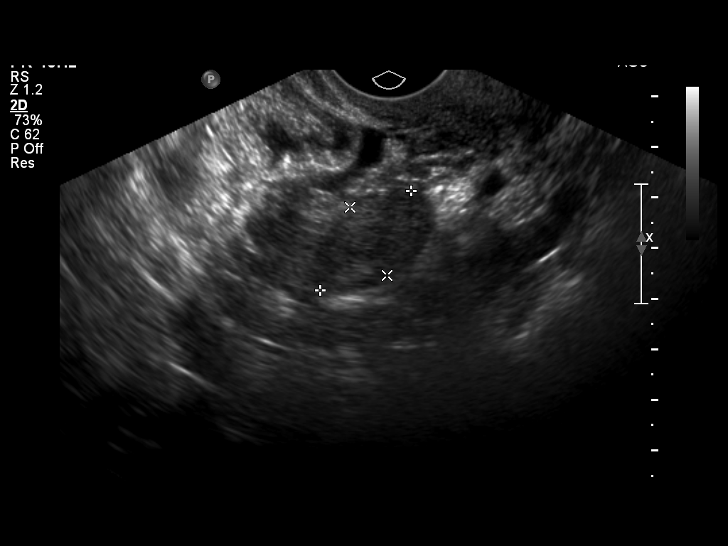
[im 31/46]
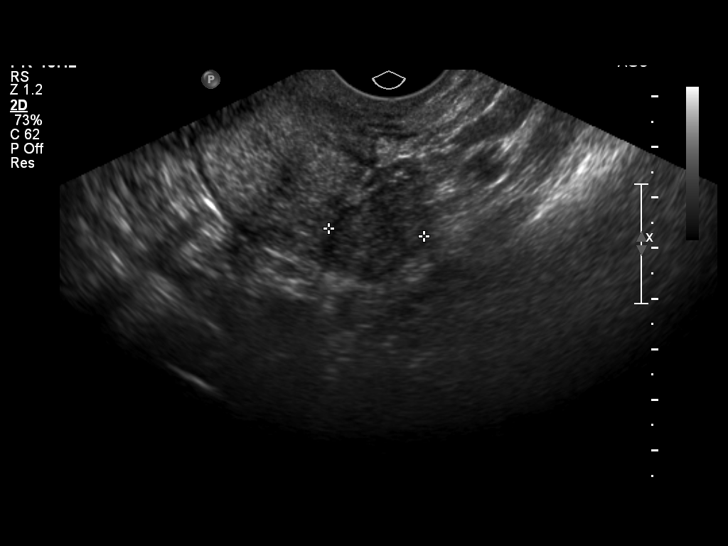
[im 34/46]
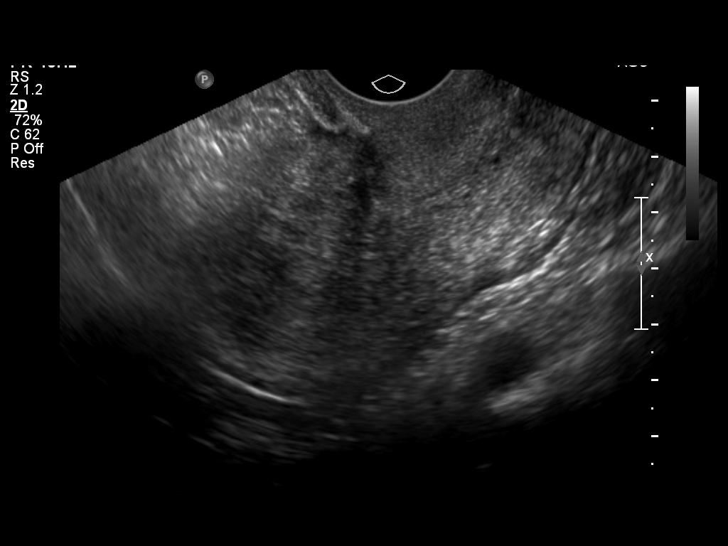
[im 38/46]
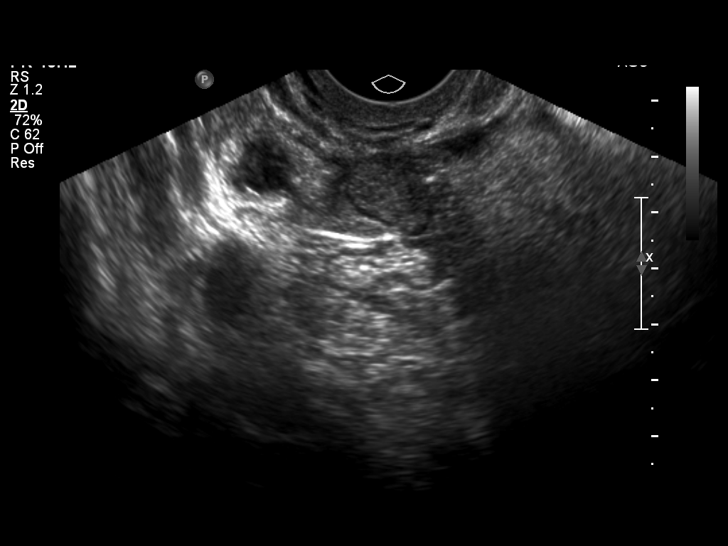
[im 42/46]
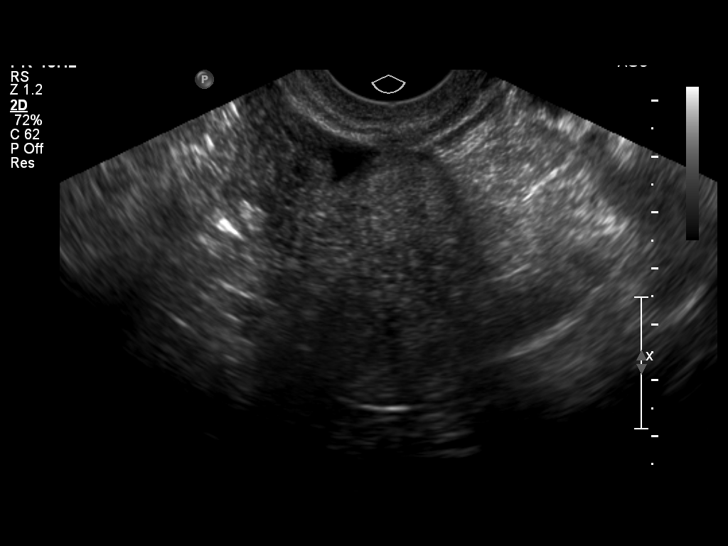
[im 46/46]
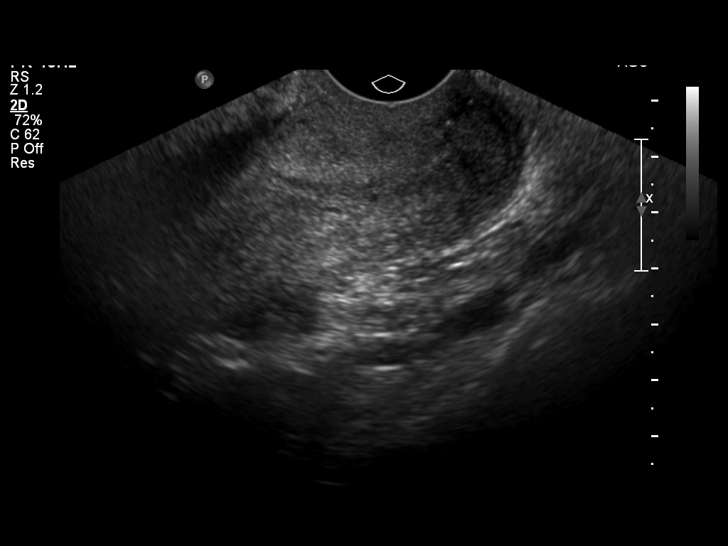

[14 of 25 positions shown; findings below may reference images not displayed]

FINDINGS: Uterus measures 8.8 x 4.7 x 4.9 cm.  Anterior fundal fibroid is
anterior fundal fibroid is 9 x 10 x 10 mm.

Endometrium endometrium is normal appearance and measures 6 mm.

Right Ovary the right ovary is 1.9 x 1.2 x 1.4 cm and has a normal
appearance.

Left Ovary the left ovary is 2.7 x 1.5 of 0.9 cm and has a normal
appearance.

Other Findings:  No free pelvic fluid identified.
IMPRESSION: 1.  Small anterior fundal fibroid.
2.  Normal-appearing ovaries.

## 2011-05-25 ENCOUNTER — Telehealth: Payer: Self-pay | Admitting: Internal Medicine

## 2011-09-19 NOTE — Telephone Encounter (Signed)
x

## 2012-01-01 ENCOUNTER — Emergency Department (HOSPITAL_COMMUNITY)
Admission: EM | Admit: 2012-01-01 | Discharge: 2012-01-01 | Disposition: A | Discharge status: Home / Self Care | Payer: Self-pay | Source: Home / Self Care | Attending: Emergency Medicine | Admitting: Emergency Medicine

## 2012-01-01 ENCOUNTER — Emergency Department (INDEPENDENT_AMBULATORY_CARE_PROVIDER_SITE_OTHER): Payer: Medicaid Other

## 2012-01-01 ENCOUNTER — Encounter (HOSPITAL_COMMUNITY): Payer: Self-pay | Admitting: Emergency Medicine

## 2012-01-01 DIAGNOSIS — J209 Acute bronchitis, unspecified: Secondary | ICD-10-CM

## 2012-01-01 DIAGNOSIS — I10 Essential (primary) hypertension: Secondary | ICD-10-CM

## 2012-01-01 DIAGNOSIS — J45909 Unspecified asthma, uncomplicated: Secondary | ICD-10-CM

## 2012-01-01 DIAGNOSIS — E119 Type 2 diabetes mellitus without complications: Secondary | ICD-10-CM

## 2012-01-01 DIAGNOSIS — R0789 Other chest pain: Secondary | ICD-10-CM

## 2012-01-01 HISTORY — DX: Type 2 diabetes mellitus without complications: E11.9

## 2012-01-01 HISTORY — DX: Unspecified asthma, uncomplicated: J45.909

## 2012-01-01 HISTORY — DX: Essential (primary) hypertension: I10

## 2012-01-01 LAB — POCT I-STAT, CHEM 8
BUN: 14 mg/dL (ref 6–23)
Chloride: 103 mEq/L (ref 96–112)
Creatinine, Ser: 1.1 mg/dL (ref 0.50–1.10)
Glucose, Bld: 154 mg/dL — ABNORMAL HIGH (ref 70–99)
Hemoglobin: 15.6 g/dL — ABNORMAL HIGH (ref 12.0–15.0)
Potassium: 4 mEq/L (ref 3.5–5.1)

## 2012-01-01 MED ORDER — AMOXICILLIN 500 MG PO CAPS: 1000.0000 mg | ORAL_CAPSULE | Freq: Three times a day (TID) | ORAL | Status: DC

## 2012-01-01 MED ORDER — AMLODIPINE BESYLATE 5 MG PO TABS: 5.0000 mg | ORAL_TABLET | Freq: Every day | ORAL | Status: DC

## 2012-01-01 MED ORDER — PREDNISONE 5 MG PO KIT: 1.0000 | PACK | Freq: Every day | ORAL | Status: DC

## 2012-01-01 MED ORDER — BENZONATATE 200 MG PO CAPS: 200.0000 mg | ORAL_CAPSULE | Freq: Three times a day (TID) | ORAL | Status: DC | PRN

## 2012-01-01 MED ORDER — ONDANSETRON 8 MG PO TBDP: 8.0000 mg | ORAL_TABLET | Freq: Three times a day (TID) | ORAL | Status: DC | PRN

## 2012-01-01 MED ORDER — FLUCONAZOLE 150 MG PO TABS: 150.0000 mg | ORAL_TABLET | Freq: Once | ORAL | Status: DC

## 2012-01-01 MED ORDER — ALBUTEROL SULFATE HFA 108 (90 BASE) MCG/ACT IN AERS: 1.0000 | INHALATION_SPRAY | Freq: Four times a day (QID) | RESPIRATORY_TRACT | Status: DC | PRN

## 2012-01-01 MED ORDER — TRAMADOL HCL 50 MG PO TABS: 100.0000 mg | ORAL_TABLET | Freq: Three times a day (TID) | ORAL | Status: DC | PRN

## 2012-01-01 NOTE — ED Provider Notes (Addendum)
Chief Complaint  Patient presents with  . Flank Pain    c/o right flank pain that started on left side two wks ago and has now moved to right side. vomiting x 2 days    History of Present Illness:   Kelly Santana is a 51 year old female with type 2 diabetes and hypertension who presents today with a two-week history of upper respiratory infection and bilateral aching in the lateral rib cages. This began with nasal congestion, rhinorrhea, sneezing, and clear nasal drainage. She also had some headache, sore throat, hoarseness, cough productive yellow sputum, and wheezing at nighttime. She has a history of asthma and has an inhaler, although it's getting low and she would like a new one. Over the past one to 2 weeks she's developed pain in the lateral rib cage areas bilaterally first the left than the right. It's described as a constant ache rated 8.5/10 in intensity. It's worse with coughing, sneezing, vomiting, deep breathing, or twisting. She tried Tylenol and heat with only temporary relief. She's felt somewhat chilled but had no fever. She does have sweats with thinks this might be menopausal. She denies any abdominal pain. She has been somewhat constipated but had no blood in the stool. She's felt nauseated and vomited twice the last couple days. She has a tiny amount of reddish material in the first vomitus but not of the second. She denies any large volume hematemesis. The patient has allergies to all sulfa medications with Stevens-Johnson syndrome. She's also allergic to naproxen and ibuprofen which causes swelling. She takes Klor-Con, ranitidine, glimepiride, echo plus, benazepril, and metoprolol.  Review of Systems:  Other than noted above, the patient denies any of the following symptoms. Systemic:  No fever, chills, sweats, or fatigue. ENT:  No nasal congestion, rhinorrhea, or sore throat. Pulmonary:  No cough, wheezing, shortness of breath, sputum production, hemoptysis. Cardiac:  No  palpitations, rapid heartbeat, dizziness, presyncope or syncope. GI:  No abdominal pain, heartburn, nausea, or vomiting. Ext:  No leg pain or swelling.  PMFSH:  Past medical history, family history, social history, meds, and allergies were reviewed and updated as needed.   Physical Exam:   Vital signs:  BP 175/88  Pulse 70  Temp 98.7 F (37.1 C) (Oral)  Resp 17  SpO2 97% Gen:  Alert, oriented, in no distress, skin warm and dry. Eye:  PERRL, lids and conjunctivas normal.  Sclera non-icteric. ENT:  Mucous membranes moist, pharynx clear. Neck:  Supple, no adenopathy or tenderness.  No JVD. Lungs:  Clear to auscultation, no wheezes, rales or rhonchi.  No respiratory distress. Heart:  Regular rhythm.  No gallops, murmers, clicks or rubs. Chest:  She has moderate chest wall tenderness in the lower rib cage areas bilaterally. Abdomen:  Soft, nontender, no organomegaly or mass.  Bowel sounds normal.  No pulsatile abdominal mass or bruit. Ext:  No edema.  No calf tenderness and Homann's sign negative.  Pulses full and equal. Skin:  Warm and dry.  No rash.  Labs:   Results for orders placed during the hospital encounter of 01/01/12  POCT I-STAT, CHEM 8      Component Value Range   Sodium 143  135 - 145 mEq/L   Potassium 4.0  3.5 - 5.1 mEq/L   Chloride 103  96 - 112 mEq/L   BUN 14  6 - 23 mg/dL   Creatinine, Ser 1.61  0.50 - 1.10 mg/dL   Glucose, Bld 096 (*) 70 - 99 mg/dL  Calcium, Ion 1.21  1.12 - 1.23 mmol/L   TCO2 28  0 - 100 mmol/L   Hemoglobin 15.6 (*) 12.0 - 15.0 g/dL   HCT 16.1  09.6 - 04.5 %     Radiology:  Dg Chest 2 View  01/01/2012  *RADIOLOGY REPORT*  Clinical Data: Productive cough.  Chest pain.  CHEST - 2 VIEW  Comparison: Chest x-ray 06/10/2010.  Findings: Lung volumes are normal.  No consolidative airspace disease.  No pleural effusions.  No pneumothorax.  No pulmonary nodule or mass noted.  Pulmonary vasculature and the cardiomediastinal silhouette are within normal  limits.  IMPRESSION: 1. No radiographic evidence of acute cardiopulmonary disease.   Original Report Authenticated By: Trudie Reed, M.D.    I reviewed the images independently and personally and concur with the radiologist's findings.  Assessment:  The primary encounter diagnosis was Musculoskeletal chest pain. Diagnoses of Acute bronchitis, Asthma, Type 2 diabetes mellitus, and Hypertension, uncontrolled were also pertinent to this visit.   Plan:   1.  The following meds were prescribed:   New Prescriptions   ALBUTEROL (PROVENTIL HFA;VENTOLIN HFA) 108 (90 BASE) MCG/ACT INHALER    Inhale 1-2 puffs into the lungs every 6 (six) hours as needed for wheezing.   AMLODIPINE (NORVASC) 5 MG TABLET    Take 1 tablet (5 mg total) by mouth daily.   AMOXICILLIN (AMOXIL) 500 MG CAPSULE    Take 2 capsules (1,000 mg total) by mouth 3 (three) times daily.   BENZONATATE (TESSALON) 200 MG CAPSULE    Take 1 capsule (200 mg total) by mouth 3 (three) times daily as needed for cough.   FLUCONAZOLE (DIFLUCAN) 150 MG TABLET    Take 1 tablet (150 mg total) by mouth once.   ONDANSETRON (ZOFRAN ODT) 8 MG DISINTEGRATING TABLET    Take 1 tablet (8 mg total) by mouth every 8 (eight) hours as needed for nausea.   PREDNISONE 5 MG KIT    Take 1 kit (5 mg total) by mouth daily after breakfast. Prednisone 5 mg 6 day dosepack.  Take as directed.   TRAMADOL (ULTRAM) 50 MG TABLET    Take 2 tablets (100 mg total) by mouth every 8 (eight) hours as needed for pain.   2.  The patient was instructed in symptomatic care and handouts were given. She was given a prescription for amlodipine for her blood pressure as an add-on since it was elevated today. She was told to followup with her primary care physician, although she states she will be going to a different primary care doctor, and will not get in to see them until after the first of the year. 3.  The patient was told to return if becoming worse in any way, if no better in 3 or 4  days, and given some red flag symptoms that would indicate earlier return.    Reuben Likes, MD 01/01/12 1458  Addendum: The patient called in today stating that the tramadol was not controlling her pain and she requested a prescription for hydrocodone. I had offered this on her visit here, so although can give her a prescription for that.  Reuben Likes, MD 01/04/12 [phone]

## 2012-01-01 NOTE — ED Notes (Signed)
Pt states that two wks ago she experienced cold symptoms and had left flank pain that over a weeks time has migrated to the right side.   C/o vomiting x2 days, unable to keep anything down. Pt denies fever and diarrhea

## 2012-01-04 ENCOUNTER — Telehealth (HOSPITAL_COMMUNITY): Payer: Self-pay | Admitting: *Deleted

## 2012-01-04 MED ORDER — HYDROCODONE-ACETAMINOPHEN 5-325 MG PO TABS: ORAL_TABLET | ORAL | Status: DC

## 2012-01-04 NOTE — ED Notes (Signed)
Pt. called and said Dr. Lorenz Coaster offered her Hydrocodone and she said she was going to try the Tramadol.  She said it is not doing anything for her pain. She is asking for the Hydrocodone.  I told her I would call back.  Discussed with Dr. Lorenz Coaster and he said he would print the Rx. of Hydrocodone but pt. would have to come pick it up. I called pt. back and told her this. She said she will come after work. Vassie Moselle 01/04/2012

## 2012-03-20 ENCOUNTER — Encounter (HOSPITAL_COMMUNITY): Payer: Self-pay | Admitting: Emergency Medicine

## 2012-03-20 ENCOUNTER — Other Ambulatory Visit (HOSPITAL_COMMUNITY)
Admission: RE | Admit: 2012-03-20 | Discharge: 2012-03-20 | Disposition: A | Discharge status: Home / Self Care | Payer: BC Managed Care – PPO | Source: Ambulatory Visit | Attending: Emergency Medicine | Admitting: Emergency Medicine

## 2012-03-20 ENCOUNTER — Emergency Department (INDEPENDENT_AMBULATORY_CARE_PROVIDER_SITE_OTHER)
Admission: EM | Admit: 2012-03-20 | Discharge: 2012-03-20 | Disposition: A | Discharge status: Home / Self Care | Payer: BC Managed Care – PPO | Source: Home / Self Care | Attending: Emergency Medicine | Admitting: Emergency Medicine

## 2012-03-20 DIAGNOSIS — N760 Acute vaginitis: Secondary | ICD-10-CM | POA: Insufficient documentation

## 2012-03-20 DIAGNOSIS — Z113 Encounter for screening for infections with a predominantly sexual mode of transmission: Secondary | ICD-10-CM | POA: Insufficient documentation

## 2012-03-20 DIAGNOSIS — N898 Other specified noninflammatory disorders of vagina: Secondary | ICD-10-CM

## 2012-03-20 NOTE — ED Notes (Signed)
Vaginal discharge for 2 weeks.  Denies odor to discharge.  Denies abdominal pain or back pain

## 2012-03-20 NOTE — ED Notes (Signed)
MD at bedside. 

## 2012-03-20 NOTE — ED Provider Notes (Signed)
History     CSN: [id number]  Arrival date & time 03/20/12  1530   First MD Initiated Contact with Patient 03/20/12 1533      Chief Complaint  Patient presents with  . Vaginal Discharge    (Consider location/radiation/quality/duration/timing/severity/associated sxs/prior treatment) HPI Comments: Patient presents urgent care this evening complaining of ongoing vaginal discharge for 2 weeks. She denies any irritation or itchiness, as well as any order to this discharge. Denies any abdominal pain, no urinary symptoms. Describes that she took an antibiotic about a month ago ( which she did not complete a ten-day course), was provided with a prescription for Diflucan which she took about a week ago and did not resolve her vaginal discharge. Patient denies any fevers, pelvic pain, vaginal bleeding, or vaginal rashes or sores. When asked, she does admit to unprotected sexual activity was to be checked for STDs.  Patient is a 52 y.o. female presenting with vaginal discharge.  Vaginal Discharge This is a new problem. The current episode started more than 1 week ago. The problem occurs constantly. The problem has been gradually worsening. Pertinent negatives include no abdominal pain. Nothing aggravates the symptoms. Nothing relieves the symptoms. Treatments tried: Diflucan. The treatment provided moderate relief.    Past Medical History  Diagnosis Date  . Asthma   . Diabetes mellitus without complication   . Hypertension     Past Surgical History  Procedure Date  . Breast surgery   . Cesarean section     No family history on file.  History  Substance Use Topics  . Smoking status: Current Every Day Smoker -- 0.5 packs/day    Types: Cigarettes  . Smokeless tobacco: Not on file  . Alcohol Use: No    OB History    Grav Para Term Preterm Abortions TAB SAB Ect Mult Living                  Review of Systems  Constitutional: Negative for fever, chills, activity change, appetite  change and fatigue.  Gastrointestinal: Negative for abdominal pain.  Genitourinary: Positive for vaginal discharge. Negative for dysuria, urgency, frequency, flank pain, decreased urine volume, genital sores and vaginal pain.  Skin: Negative for rash and wound.    Allergies  Ibuprofen and Sulfonamide derivatives  Home Medications   Current Outpatient Rx  Name  Route  Sig  Dispense  Refill  . ALBUTEROL SULFATE HFA 108 (90 BASE) MCG/ACT IN AERS   Inhalation   Inhale 1-2 puffs into the lungs every 6 (six) hours as needed for wheezing.   1 Inhaler   0   . AMLODIPINE BESYLATE 5 MG PO TABS   Oral   Take 1 tablet (5 mg total) by mouth daily.   30 tablet   3   . AMOXICILLIN 500 MG PO CAPS   Oral   Take 2 capsules (1,000 mg total) by mouth 3 (three) times daily.   60 capsule   0     Dispense as written.   Marland Kitchen BENAZEPRIL HCL PO   Oral   Take by mouth.         . BENZONATATE 200 MG PO CAPS   Oral   Take 1 capsule (200 mg total) by mouth 3 (three) times daily as needed for cough.   30 capsule   0   . FLUCONAZOLE 150 MG PO TABS   Oral   Take 1 tablet (150 mg total) by mouth once.   1 tablet   3   .  HYDROCODONE-ACETAMINOPHEN 5-325 MG PO TABS      1 to 2 tabs every 4 to 6 hours as needed for pain.   20 tablet   0   . LORATADINE PO   Oral   Take by mouth.         . ONDANSETRON 8 MG PO TBDP   Oral   Take 1 tablet (8 mg total) by mouth every 8 (eight) hours as needed for nausea.   20 tablet   0   . ACTOPLUS MET PO   Oral   Take by mouth.         Marland Kitchen KLOR-CON PO   Oral   Take by mouth.         Marland Kitchen PREDNISONE 5 MG PO KIT   Oral   Take 1 kit (5 mg total) by mouth daily after breakfast. Prednisone 5 mg 6 day dosepack.  Take as directed.   1 kit   0   . TRAMADOL HCL 50 MG PO TABS   Oral   Take 2 tablets (100 mg total) by mouth every 8 (eight) hours as needed for pain.   30 tablet   0     BP 155/82  Pulse 75  Temp 97.8 F (36.6 C) (Oral)  Resp 18   Ht 5\' 5"  (1.651 m)  Wt 195 lb (88.451 kg)  BMI 32.45 kg/m2  SpO2 100%  Physical Exam  Nursing note reviewed. Constitutional: Vital signs are normal. She appears well-developed and well-nourished.  Non-toxic appearance.  Abdominal: Soft. There is no tenderness.  Neurological: She is alert.    ED Course  Procedures (including critical care time)   Labs Reviewed  URINE CYTOLOGY ANCILLARY ONLY   No results found.   1. Vaginal discharge       MDM  Vaginal discharge- patient has been screened with a your cytology sample for both STDs and carcinoma and vaginal cavity abscess. Patient has been informed, that test will be read- results availabletomorrow ( as per [facility]). Have encouraged her to wait for test results for more specific treatment as she has been treated before. She agreed with waiting prior treatment. I have also explained to patient that she will not be called if normal test results.      Jimmie Molly, MD 03/20/12 1758

## 2012-03-21 ENCOUNTER — Encounter (HOSPITAL_COMMUNITY): Payer: Self-pay | Admitting: Emergency Medicine

## 2012-03-21 NOTE — ED Notes (Signed)
Returned call from pt informed that lab results were not back and she would be called for abnormal results

## 2012-03-22 ENCOUNTER — Encounter (HOSPITAL_COMMUNITY): Payer: Self-pay | Admitting: Emergency Medicine

## 2012-04-11 NOTE — ED Notes (Signed)
GC/Chlamydia/Trich neg., Affirm: Candida neg. Prevotella Bivea pos.  Message sent to Dr. Ladon Applebaum asking if this needs treatment. Kelly Santana 04/11/2012

## 2012-04-16 ENCOUNTER — Telehealth (HOSPITAL_COMMUNITY): Payer: Self-pay | Admitting: *Deleted

## 2012-04-16 NOTE — ED Notes (Signed)
Dr. Ladon Applebaum wrote to call pt. and ask if she has any vaginal discharge.  I called pt.  Pt. verified x 2 and given results.  Pt. said she does not have any symptoms now. No vaginal discharge or irritation.  Dr. Ladon Applebaum has told me before, " no symptoms, no treatment necessary." I told pt. we probably would not treat her now since she did not have any symptoms.  She asked what Prevotella Bivia was. I explained that it is not an STD but a form of bacterial vaginosis that we would treat only if she was having symptoms. Pt. voiced understanding. Vassie Moselle 04/16/2012

## 2012-04-24 ENCOUNTER — Telehealth (HOSPITAL_COMMUNITY): Payer: Self-pay | Admitting: *Deleted

## 2012-04-24 NOTE — ED Notes (Signed)
2/20 Pt. called and asked for lab RN to call back and call in needed medicine. Message taken by E. Wotring [employer] fundraiser.  I got message today and called pt.  Left message on VM for her to call back.

## 2012-04-25 NOTE — ED Notes (Signed)
Return call from patient , who states he took flagyl x pills from her MD as 1 dose. Advised this was adequate treatment

## 2012-04-25 NOTE — ED Notes (Signed)
Message on answering machine, requesting we call in Rx from 1-21 visit ; left message for her to return call

## 2012-11-21 ENCOUNTER — Other Ambulatory Visit: Payer: Self-pay | Admitting: Internal Medicine

## 2012-11-21 DIAGNOSIS — Z1231 Encounter for screening mammogram for malignant neoplasm of breast: Secondary | ICD-10-CM

## 2013-02-18 ENCOUNTER — Emergency Department (HOSPITAL_COMMUNITY)
Admission: EM | Admit: 2013-02-18 | Discharge: 2013-02-18 | Disposition: A | Discharge status: Home / Self Care | Payer: BC Managed Care – PPO | Attending: Emergency Medicine | Admitting: Emergency Medicine

## 2013-02-18 ENCOUNTER — Encounter (HOSPITAL_COMMUNITY): Payer: Self-pay | Admitting: Emergency Medicine

## 2013-02-18 DIAGNOSIS — F17200 Nicotine dependence, unspecified, uncomplicated: Secondary | ICD-10-CM | POA: Insufficient documentation

## 2013-02-18 DIAGNOSIS — Z792 Long term (current) use of antibiotics: Secondary | ICD-10-CM | POA: Insufficient documentation

## 2013-02-18 DIAGNOSIS — IMO0002 Reserved for concepts with insufficient information to code with codable children: Secondary | ICD-10-CM | POA: Insufficient documentation

## 2013-02-18 DIAGNOSIS — R52 Pain, unspecified: Secondary | ICD-10-CM | POA: Insufficient documentation

## 2013-02-18 DIAGNOSIS — I10 Essential (primary) hypertension: Secondary | ICD-10-CM | POA: Insufficient documentation

## 2013-02-18 DIAGNOSIS — Z79899 Other long term (current) drug therapy: Secondary | ICD-10-CM | POA: Insufficient documentation

## 2013-02-18 DIAGNOSIS — J45909 Unspecified asthma, uncomplicated: Secondary | ICD-10-CM | POA: Insufficient documentation

## 2013-02-18 DIAGNOSIS — E119 Type 2 diabetes mellitus without complications: Secondary | ICD-10-CM | POA: Insufficient documentation

## 2013-02-18 DIAGNOSIS — J111 Influenza due to unidentified influenza virus with other respiratory manifestations: Secondary | ICD-10-CM | POA: Insufficient documentation

## 2013-02-18 NOTE — ED Provider Notes (Signed)
CSN: [id number]     Arrival date & time 02/18/13  1603 History  This chart was scribed for non-physician practitioner, Santiago Glad, PA-C working with Glynn Octave, MD by Greggory Stallion, ED scribe. This patient was seen in room TR10C/TR10C and the patient's care was started at 4:35 PM.   Chief Complaint  Patient presents with  . URI  . Generalized Body Aches   The history is provided by the patient. No language interpreter was used.   HPI Comments: Kelly Santana is a 52 y.o. female who presents to the Emergency Department complaining of generalized body aches, mild congestion, sore throat, chills and headache that started 2 days ago. States she had two episodes of emesis yesterday and one day. She has taken NyQuil with no relief. Denies fever, sneezing, cough, or SOB. Denies sick contacts. Pt has had a flu shot this year.   Past Medical History  Diagnosis Date  . Asthma   . Diabetes mellitus without complication   . Hypertension    Past Surgical History  Procedure Laterality Date  . Breast surgery    . Cesarean section     History reviewed. No pertinent family history. History  Substance Use Topics  . Smoking status: Current Every Day Smoker -- 0.50 packs/day    Types: Cigarettes  . Smokeless tobacco: Not on file  . Alcohol Use: No   OB History   Grav Para Term Preterm Abortions TAB SAB Ect Mult Living                 Review of Systems  Constitutional: Positive for chills and fatigue. Negative for fever.  HENT: Positive for congestion and sore throat. Negative for sneezing.   Respiratory: Negative for cough.   Gastrointestinal: Positive for nausea and vomiting.  Neurological: Positive for headaches.  All other systems reviewed and are negative.    Allergies  Ibuprofen and Sulfonamide derivatives  Home Medications   Current Outpatient Rx  Name  Route  Sig  Dispense  Refill  . albuterol (PROVENTIL HFA;VENTOLIN HFA) 108 (90 BASE) MCG/ACT inhaler  Inhalation   Inhale 1-2 puffs into the lungs every 6 (six) hours as needed for wheezing.   1 Inhaler   0   . amLODipine (NORVASC) 5 MG tablet   Oral   Take 1 tablet (5 mg total) by mouth daily.   30 tablet   3   . amoxicillin (AMOXIL) 500 MG capsule   Oral   Take 2 capsules (1,000 mg total) by mouth 3 (three) times daily.   60 capsule   0     Dispense as written.   Marland Kitchen BENAZEPRIL HCL PO   Oral   Take by mouth.         . benzonatate (TESSALON) 200 MG capsule   Oral   Take 1 capsule (200 mg total) by mouth 3 (three) times daily as needed for cough.   30 capsule   0   . fluconazole (DIFLUCAN) 150 MG tablet   Oral   Take 1 tablet (150 mg total) by mouth once.   1 tablet   3   . HYDROcodone-acetaminophen (NORCO/VICODIN) 5-325 MG per tablet      1 to 2 tabs every 4 to 6 hours as needed for pain.   20 tablet   0   . LORATADINE PO   Oral   Take by mouth.         . ondansetron (ZOFRAN ODT) 8 MG disintegrating tablet  Oral   Take 1 tablet (8 mg total) by mouth every 8 (eight) hours as needed for nausea.   20 tablet   0   . Pioglitazone HCl-Metformin HCl (ACTOPLUS MET PO)   Oral   Take by mouth.         . Potassium Chloride (KLOR-CON PO)   Oral   Take by mouth.         . PredniSONE 5 MG KIT   Oral   Take 1 kit (5 mg total) by mouth daily after breakfast. Prednisone 5 mg 6 day dosepack.  Take as directed.   1 kit   0   . traMADol (ULTRAM) 50 MG tablet   Oral   Take 2 tablets (100 mg total) by mouth every 8 (eight) hours as needed for pain.   30 tablet   0    BP 120/75  Pulse 83  Temp(Src) 98.7 F (37.1 C) (Oral)  Resp 18  Ht 5\' 4"  (1.626 m)  Wt 175 lb (79.379 kg)  BMI 30.02 kg/m2  SpO2 98%  Physical Exam  Nursing note and vitals reviewed. Constitutional: She appears well-developed and well-nourished.  HENT:  Head: Normocephalic and atraumatic.  Right Ear: Tympanic membrane normal.  Left Ear: Tympanic membrane normal.  Nose:  Rhinorrhea (mild) present.  Mouth/Throat: Posterior oropharyngeal erythema (mild) present. No oropharyngeal exudate or posterior oropharyngeal edema.  Eyes: EOM are normal. Pupils are equal, round, and reactive to light.  Neck: Normal range of motion. Neck supple.  Cardiovascular: Normal rate, regular rhythm and normal heart sounds.   Pulmonary/Chest: Effort normal and breath sounds normal. She has no wheezes. She has no rhonchi. She has no rales.  Musculoskeletal: Normal range of motion.  Neurological: She is alert.  Skin: Skin is warm and dry.  Psychiatric: She has a normal mood and affect. Her behavior is normal.    ED Course  Procedures (including critical care time)  DIAGNOSTIC STUDIES: Oxygen Saturation is 98% on RA, normal by my interpretation.    COORDINATION OF CARE: 4:42 PM-Discussed treatment plan which includes fluids and rest with pt at bedside and pt agreed to plan.   Labs Review Labs Reviewed - No data to display Imaging Review No results found.  EKG Interpretation   None       MDM  No diagnosis found. MDM Number of Diagnoses or Management Options Influenza:   Patient with symptoms consistent with influenza.  Vitals are stable.  No signs of dehydration, tolerating PO's.  Lungs are clear. No cough or SOB.  Due to patient's presentation and physical exam a chest x-ray was not ordered bc likely diagnosis of flu.  Patient will be discharged with instructions to orally hydrate, rest, and use over-the-counter medications such as anti-inflammatories ibuprofen and Aleve for muscle aches and Tylenol for fever.  Patient stable for discharge.  Return precautions given.  I personally performed the services described in this documentation, which was scribed in my presence. The recorded information has been reviewed and is accurate.   Santiago Glad, PA-C 02/18/13 1702

## 2013-02-18 NOTE — ED Notes (Signed)
Pt c/o body aches and URI sx 2 days

## 2013-02-19 NOTE — ED Provider Notes (Signed)
Medical screening examination/treatment/procedure(s) were performed by non-physician practitioner and as supervising physician I was immediately available for consultation/collaboration.  EKG Interpretation   None         Cael Worth, MD 02/19/13 0033 

## 2013-05-12 ENCOUNTER — Emergency Department (HOSPITAL_COMMUNITY)
Admission: EM | Admit: 2013-05-12 | Discharge: 2013-05-12 | Disposition: A | Discharge status: Home / Self Care | Payer: BC Managed Care – PPO | Attending: Emergency Medicine | Admitting: Emergency Medicine

## 2013-05-12 ENCOUNTER — Encounter (HOSPITAL_COMMUNITY): Payer: Self-pay | Admitting: Emergency Medicine

## 2013-05-12 DIAGNOSIS — E119 Type 2 diabetes mellitus without complications: Secondary | ICD-10-CM | POA: Insufficient documentation

## 2013-05-12 DIAGNOSIS — J069 Acute upper respiratory infection, unspecified: Secondary | ICD-10-CM | POA: Insufficient documentation

## 2013-05-12 DIAGNOSIS — R112 Nausea with vomiting, unspecified: Secondary | ICD-10-CM | POA: Insufficient documentation

## 2013-05-12 DIAGNOSIS — M549 Dorsalgia, unspecified: Secondary | ICD-10-CM | POA: Insufficient documentation

## 2013-05-12 DIAGNOSIS — F17200 Nicotine dependence, unspecified, uncomplicated: Secondary | ICD-10-CM | POA: Insufficient documentation

## 2013-05-12 DIAGNOSIS — Z79899 Other long term (current) drug therapy: Secondary | ICD-10-CM | POA: Insufficient documentation

## 2013-05-12 DIAGNOSIS — J45901 Unspecified asthma with (acute) exacerbation: Secondary | ICD-10-CM | POA: Insufficient documentation

## 2013-05-12 DIAGNOSIS — I10 Essential (primary) hypertension: Secondary | ICD-10-CM | POA: Insufficient documentation

## 2013-05-12 MED ORDER — GUAIFENESIN 100 MG/5ML PO LIQD: 100.0000 mg | ORAL | Status: DC | PRN

## 2013-05-12 MED ORDER — HYDROCODONE-ACETAMINOPHEN 7.5-325 MG/15ML PO SOLN: 10.0000 mL | Freq: Four times a day (QID) | ORAL | Status: DC | PRN

## 2013-05-12 NOTE — ED Provider Notes (Signed)
Medical screening examination/treatment/procedure(s) were performed by non-physician practitioner and as supervising physician I was immediately available for consultation/collaboration.   EKG Interpretation None        Jadalynn Burr B. Judge Duque, MD 05/12/13 2044 

## 2013-05-12 NOTE — ED Notes (Signed)
Onset Thursday night runny nose, sneezing, sore throat and cough.  Pt used inhaler yesterday for wheezing.  Onset yesterday vomiting x 3, last vomit 4am today.  Voice is hoarse.  No respiratory distress.

## 2013-05-12 NOTE — Discharge Instructions (Signed)

## 2013-05-12 NOTE — ED Provider Notes (Signed)
CSN: [id number]     Arrival date & time 05/12/13  1748 History  This chart was scribed for non-physician practitioner, Fayrene HelperBowie Clifford Coudriet, PA-C working with Bonnita Levanharles B. Bernette MayersSheldon, MD by Greggory StallionKayla Andersen, ED scribe. This patient was seen in room TR08C/TR08C and the patient's care was started at 5:59 PM.   Chief Complaint  Patient presents with  . Cough  . Fever  . Sore Throat   The history is provided by the patient. No language interpreter was used.   HPI Comments: Kelly Santana is a 53 y.o. female with history of asthma who presents to the Emergency Department complaining of fever, productive cough of yellow sputum, sneezing, and sore throat that started 2 days ago. Pt states her symptoms started with sneezing and then got worse. She has had congestion, rhinorrhea, nausea and back pain from the cough. Pt has had 3 episodes of emesis. Her fever has been around 101 at home and has taken Tylenol with some relief of fever. She has had some wheezing and used her inhaler with some relief. Pt denies trouble breathing, hematemesis, hematochezia, diarrhea, abdominal pain, rash. Pt quit smoking cigarettes one month ago.   Past Medical History  Diagnosis Date  . Asthma   . Diabetes mellitus without complication   . Hypertension    Past Surgical History  Procedure Laterality Date  . Breast surgery    . Cesarean section     History reviewed. No pertinent family history. History  Substance Use Topics  . Smoking status: Current Every Day Smoker -- 0.50 packs/day    Types: Cigarettes  . Smokeless tobacco: Not on file  . Alcohol Use: No   OB History   Grav Para Term Preterm Abortions TAB SAB Ect Mult Living                 Review of Systems  Constitutional: Positive for fever.  HENT: Positive for congestion, rhinorrhea, sneezing and sore throat.   Respiratory: Positive for cough and wheezing.   Gastrointestinal: Positive for nausea and vomiting. Negative for abdominal pain, diarrhea and blood in stool.   Musculoskeletal: Positive for back pain.  Skin: Negative for rash.  All other systems reviewed and are negative.   Allergies  Ibuprofen and Sulfonamide derivatives  Home Medications   Current Outpatient Rx  Name  Route  Sig  Dispense  Refill  . amLODipine (NORVASC) 5 MG tablet   Oral   Take 1 tablet (5 mg total) by mouth daily.   30 tablet   3   . benazepril (LOTENSIN) 40 MG tablet   Oral   Take 40 mg by mouth daily.         Marland Kitchen. gabapentin (NEURONTIN) 300 MG capsule   Oral   Take 300 mg by mouth at bedtime.          Marland Kitchen. glimepiride (AMARYL) 4 MG tablet   Oral   Take 4 mg by mouth daily with breakfast.         . hydrochlorothiazide (HYDRODIURIL) 25 MG tablet   Oral   Take 25 mg by mouth daily.         Marland Kitchen. HYDROcodone-acetaminophen (NORCO/VICODIN) 5-325 MG per tablet      1 to 2 tabs every 4 to 6 hours as needed for pain.   20 tablet   0   . loratadine (CLARITIN) 10 MG tablet   Oral   Take 10 mg by mouth daily.         . metoprolol  succinate (TOPROL-XL) 50 MG 24 hr tablet   Oral   Take 50 mg by mouth daily. Take with or immediately following a meal.         . potassium chloride SA (K-DUR,KLOR-CON) 20 MEQ tablet   Oral   Take 20 mEq by mouth daily.         . ranitidine (ZANTAC) 150 MG tablet   Oral   Take 150 mg by mouth daily.         . Saxagliptin-Metformin (KOMBIGLYZE XR) 5-500 MG TB24   Oral   Take 1 tablet by mouth daily.          . simvastatin (ZOCOR) 10 MG tablet   Oral   Take 10 mg by mouth daily.          BP 139/87  Pulse 89  Temp(Src) 100.2 F (37.9 C) (Oral)  Resp 20  SpO2 100%  Physical Exam  Nursing note and vitals reviewed. Constitutional: She is oriented to person, place, and time. She appears well-developed and well-nourished. No distress.  HENT:  Head: Normocephalic and atraumatic.  Right Ear: Tympanic membrane and ear canal normal.  Left Ear: Tympanic membrane and ear canal normal.  Nose: Nose normal.   Mouth/Throat: Uvula is midline. No trismus in the jaw. Posterior oropharyngeal erythema present. No oropharyngeal exudate or posterior oropharyngeal edema.  No tonsillar enlargement. No evidence of deep tissue infection.   Eyes: EOM are normal.  Neck: Neck supple. No tracheal deviation present.  Cardiovascular: Normal rate, regular rhythm and normal heart sounds.  Exam reveals no gallop and no friction rub.   No murmur heard. Pulmonary/Chest: Effort normal. No respiratory distress. She has no wheezes. She has rhonchi. She has no rales.  Fain rhonchi with cough.  Musculoskeletal: Normal range of motion.  Lymphadenopathy:    She has no cervical adenopathy.  Neurological: She is alert and oriented to person, place, and time.  Skin: Skin is warm and dry.  Psychiatric: She has a normal mood and affect. Her behavior is normal.    ED Course  Procedures (including critical care time)  DIAGNOSTIC STUDIES: Oxygen Saturation is 100% on RA, normal by my interpretation.    COORDINATION OF CARE: 6:04 PM-Discussed treatment plan which includes cough medication with pt at bedside and pt agreed to plan.   6:42 PM sxs suggestive of viral syndrome.  Low grade fever, will treat with tylenol. Otherwise hemodynamically stable, no wheezing, no hypoxia to suggest pna.    Labs Review Labs Reviewed - No data to display Imaging Review No results found.   EKG Interpretation None      MDM   Final diagnoses:  URI (upper respiratory infection)    BP 139/87  Pulse 89  Temp(Src) 100.2 F (37.9 C) (Oral)  Resp 20  SpO2 100%   I personally performed the services described in this documentation, which was scribed in my presence. The recorded information has been reviewed and is accurate.     Fayrene Helper, PA-C 05/12/13 1843

## 2013-05-12 NOTE — ED Notes (Addendum)
Per pt she has had a fever, cough, and sore throat for the past few days. Pt hoarse. sts some nausea and rib and back pain from coughing.

## 2013-05-16 ENCOUNTER — Emergency Department (HOSPITAL_COMMUNITY)
Admission: EM | Admit: 2013-05-16 | Discharge: 2013-05-16 | Disposition: A | Discharge status: Home / Self Care | Payer: BC Managed Care – PPO | Attending: Emergency Medicine | Admitting: Emergency Medicine

## 2013-05-16 ENCOUNTER — Encounter (HOSPITAL_COMMUNITY): Payer: Self-pay | Admitting: Emergency Medicine

## 2013-05-16 ENCOUNTER — Emergency Department (HOSPITAL_COMMUNITY): Payer: BC Managed Care – PPO

## 2013-05-16 DIAGNOSIS — I10 Essential (primary) hypertension: Secondary | ICD-10-CM | POA: Insufficient documentation

## 2013-05-16 DIAGNOSIS — F17200 Nicotine dependence, unspecified, uncomplicated: Secondary | ICD-10-CM | POA: Insufficient documentation

## 2013-05-16 DIAGNOSIS — E119 Type 2 diabetes mellitus without complications: Secondary | ICD-10-CM | POA: Insufficient documentation

## 2013-05-16 DIAGNOSIS — Z79899 Other long term (current) drug therapy: Secondary | ICD-10-CM | POA: Insufficient documentation

## 2013-05-16 DIAGNOSIS — J40 Bronchitis, not specified as acute or chronic: Secondary | ICD-10-CM

## 2013-05-16 DIAGNOSIS — J45901 Unspecified asthma with (acute) exacerbation: Secondary | ICD-10-CM | POA: Insufficient documentation

## 2013-05-16 DIAGNOSIS — R1110 Vomiting, unspecified: Secondary | ICD-10-CM | POA: Insufficient documentation

## 2013-05-16 LAB — CBG MONITORING, ED: GLUCOSE-CAPILLARY: 157 mg/dL — AB (ref 70–99)

## 2013-05-16 MED ORDER — ALBUTEROL SULFATE HFA 108 (90 BASE) MCG/ACT IN AERS
2.0000 | INHALATION_SPRAY | RESPIRATORY_TRACT | Status: DC | PRN
  Administered 2013-05-16: 2 via RESPIRATORY_TRACT
  Filled 2013-05-16: qty 6.7

## 2013-05-16 MED ORDER — IPRATROPIUM-ALBUTEROL 0.5-2.5 (3) MG/3ML IN SOLN
3.0000 mL | Freq: Once | RESPIRATORY_TRACT | Status: AC
  Administered 2013-05-16: 3 mL via RESPIRATORY_TRACT
  Filled 2013-05-16: qty 3

## 2013-05-16 MED ORDER — DEXAMETHASONE 6 MG PO TABS
12.0000 mg | ORAL_TABLET | Freq: Once | ORAL | Status: AC
  Administered 2013-05-16: 12 mg via ORAL
  Filled 2013-05-16: qty 2

## 2013-05-16 NOTE — ED Notes (Signed)
Productive cough (yellowish), wheezing when laying down. Onset:  05/12/13

## 2013-05-16 NOTE — Discharge Instructions (Signed)
Bronchitis Bronchitis is inflammation of the airways that extend from the windpipe into the lungs (bronchi). The inflammation often causes mucus to develop, which leads to a cough. If the inflammation becomes severe, it may cause shortness of breath. CAUSES  Bronchitis may be caused by:   Viral infections.   Bacteria.   Cigarette smoke.   Allergens, pollutants, and other irritants.  SIGNS AND SYMPTOMS  The most common symptom of bronchitis is a frequent cough that produces mucus. Other symptoms include:  Fever.   Body aches.   Chest congestion.   Chills.   Shortness of breath.   Sore throat.  DIAGNOSIS  Bronchitis is usually diagnosed through a medical history and physical exam. Tests, such as chest X-rays, are sometimes done to rule out other conditions.  TREATMENT  You may need to avoid contact with whatever caused the problem (smoking, for example). Medicines are sometimes needed. These may include:  Antibiotics. These may be prescribed if the condition is caused by bacteria.  Cough suppressants. These may be prescribed for relief of cough symptoms.   Inhaled medicines. These may be prescribed to help open your airways and make it easier for you to breathe.   Steroid medicines. These may be prescribed for those with recurrent (chronic) bronchitis. HOME CARE INSTRUCTIONS  Get plenty of rest.   Drink enough fluids to keep your urine clear or pale yellow (unless you have a medical condition that requires fluid restriction). Increasing fluids may help thin your secretions and will prevent dehydration.   Only take over-the-counter or prescription medicines as directed by your health care provider.  Only take antibiotics as directed. Make sure you finish them even if you start to feel better.  Avoid secondhand smoke, irritating chemicals, and strong fumes. These will make bronchitis worse. If you are a smoker, quit smoking. Consider using nicotine gum or  skin patches to help control withdrawal symptoms. Quitting smoking will help your lungs heal faster.   Put a cool-mist humidifier in your bedroom at night to moisten the air. This may help loosen mucus. Change the water in the humidifier daily. You can also run the hot water in your shower and sit in the bathroom with the door closed for 5 10 minutes.   Follow up with your health care provider as directed.   Wash your hands frequently to avoid catching bronchitis again or spreading an infection to others.  SEEK MEDICAL CARE IF: Your symptoms do not improve after 1 week of treatment.  SEEK IMMEDIATE MEDICAL CARE IF:  Your fever increases.  You have chills.   You have chest pain.   You have worsening shortness of breath.   You have bloody sputum.  You faint.  You have lightheadedness.  You have a severe headache.   You vomit repeatedly. MAKE SURE YOU:   Understand these instructions.  Will watch your condition.  Will get help right away if you are not doing well or get worse. Document Released: 02/14/2005 Document Revised: 12/05/2012 Document Reviewed: 10/09/2012 [facility] Patient Information 2014 [place].  Albuterol inhalation aerosol What is this medicine? ALBUTEROL (al [place]) is a bronchodilator. It helps open up the airways in your lungs to make it easier to breathe. This medicine is used to treat and to prevent bronchospasm. This medicine may be used for other purposes; ask your health care provider or pharmacist if you have questions. COMMON BRAND NAME(S): Proair HFA, Proventil HFA, Proventil, Respirol , Ventolin HFA, Ventolin What should I tell  my health care provider before I take this medicine? They need to know if you have any of the following conditions: -diabetes -heart disease or irregular heartbeat -high blood pressure -pheochromocytoma -seizures -thyroid disease -an unusual or allergic reaction to albuterol, levalbuterol,  sulfites, other medicines, foods, dyes, or preservatives -pregnant or trying to get pregnant -breast-feeding How should I use this medicine? This medicine is for inhalation through the mouth. Follow the directions on your prescription label. Take your medicine at regular intervals. Do not use more often than directed. Make sure that you are using your inhaler correctly. Ask you doctor or health care provider if you have any questions. Talk to your pediatrician regarding the use of this medicine in children. Special care may be needed. Overdosage: If you think you have taken too much of this medicine contact a poison control center or emergency room at once. NOTE: This medicine is only for you. Do not share this medicine with others. What if I miss a dose? If you miss a dose, use it as soon as you can. If it is almost time for your next dose, use only that dose. Do not use double or extra doses. What may interact with this medicine? -anti-infectives like chloroquine and pentamidine -caffeine -cisapride -diuretics -medicines for colds -medicines for depression or for emotional or psychotic conditions -medicines for weight loss including some herbal products -methadone -some antibiotics like clarithromycin, erythromycin, levofloxacin, and linezolid -some heart medicines -steroid hormones like dexamethasone, cortisone, hydrocortisone -theophylline -thyroid hormones This list may not describe all possible interactions. Give your health care provider a list of all the medicines, herbs, non-prescription drugs, or dietary supplements you use. Also tell them if you smoke, drink alcohol, or use illegal drugs. Some items may interact with your medicine. What should I watch for while using this medicine? Tell your doctor or health care professional if your symptoms do not improve. Do not use extra albuterol. If your asthma or bronchitis gets worse while you are using this medicine, call your doctor  right away. If your mouth gets dry try chewing sugarless gum or sucking hard candy. Drink water as directed. What side effects may I notice from receiving this medicine? Side effects that you should report to your doctor or health care professional as soon as possible: -allergic reactions like skin rash, itching or hives, swelling of the face, lips, or tongue -breathing problems -chest pain -feeling faint or lightheaded, falls -high blood pressure -irregular heartbeat -fever -muscle cramps or weakness -pain, tingling, numbness in the hands or feet -vomiting Side effects that usually do not require medical attention (report to your doctor or health care professional if they continue or are bothersome): -cough -difficulty sleeping -headache -nervousness or trembling -stomach upset -stuffy or runny nose -throat irritation -unusual taste This list may not describe all possible side effects. Call your doctor for medical advice about side effects. You may report side effects to FDA at 1-800-FDA-1088. Where should I keep my medicine? Keep out of the reach of children. Store at room temperature between 15 and 30 degrees C (59 and 86 degrees F). The contents are under pressure and may burst when exposed to heat or flame. Do not freeze. This medicine does not work as well if it is too cold. Throw away any unused medicine after the expiration date. Inhalers need to be thrown away after the labeled number of puffs have been used or by the expiration date; whichever comes first. Ventolin HFA should be thrown away 12  months after removing from foil pouch. Check the instructions that come with your medicine. NOTE: This sheet is a summary. It may not cover all possible information. If you have questions about this medicine, talk to your doctor, pharmacist, or health care provider.  2014, [employer]. (2012-08-02 10:57:17)

## 2013-05-16 NOTE — ED Provider Notes (Signed)
CSN: [id number]     Arrival date & time 05/16/13  0206 History   First MD Initiated Contact with Patient 05/16/13 [phone]     Chief Complaint  Patient presents with  . Shortness of Breath     (Consider location/radiation/quality/duration/timing/severity/associated sxs/prior Treatment) Patient is a 53 y.o. female presenting with shortness of breath. The history is provided by the patient.  Shortness of Breath She has had a cough for approximately the last week. Cough is productive of some yellowish sputum. She denies fever, chills, sweats. She denies dyspnea. There has been posttussive emesis but no nausea. She denies nasal congestion or sore throat. She had been seen in the ED 4 days ago and discharged with prescription for hydrocodone-acetaminophen solution for cough suppression. Tonight, she noted wheezing when she laid down. Is better the wheezing, has ceased continues to deny dyspnea. She states that her PCP had called in a prescription for albuterol inhaler, but she could not afford to fill the prescription.  Past Medical History  Diagnosis Date  . Asthma   . Diabetes mellitus without complication   . Hypertension    Past Surgical History  Procedure Laterality Date  . Breast surgery    . Cesarean section     History reviewed. No pertinent family history. History  Substance Use Topics  . Smoking status: Current Every Day Smoker -- 0.50 packs/day    Types: Cigarettes  . Smokeless tobacco: Not on file  . Alcohol Use: No   OB History   Grav Para Term Preterm Abortions TAB SAB Ect Mult Living                 Review of Systems  Respiratory: Positive for shortness of breath.   All other systems reviewed and are negative.      Allergies  Ibuprofen and Sulfonamide derivatives  Home Medications   Current Outpatient Rx  Name  Route  Sig  Dispense  Refill  . acetaminophen (TYLENOL) 500 MG tablet   Oral   Take 1,000 mg by mouth every 6 (six) hours as needed for mild  pain.         Marland Kitchen albuterol (PROVENTIL HFA;VENTOLIN HFA) 108 (90 BASE) MCG/ACT inhaler   Inhalation   Inhale 2 puffs into the lungs every 6 (six) hours as needed for wheezing or shortness of breath.         Marland Kitchen amLODipine (NORVASC) 5 MG tablet   Oral   Take 1 tablet (5 mg total) by mouth daily.   30 tablet   3   . benazepril (LOTENSIN) 40 MG tablet   Oral   Take 40 mg by mouth daily.         . DiphenhydrAMINE HCl (THERAFLU MULTI SYMPTOM PO)   Oral   Take 1 packet by mouth daily as needed (for cough).         . gabapentin (NEURONTIN) 300 MG capsule   Oral   Take 300 mg by mouth at bedtime.          Marland Kitchen glimepiride (AMARYL) 4 MG tablet   Oral   Take 4 mg by mouth daily with breakfast.         . guaiFENesin (ROBITUSSIN) 100 MG/5ML liquid   Oral   Take 5-10 mLs (100-200 mg total) by mouth every 4 (four) hours as needed for cough.   60 mL   0   . hydrochlorothiazide (HYDRODIURIL) 25 MG tablet   Oral   Take 25 mg by mouth daily.         Marland Kitchen  HYDROcodone-acetaminophen (HYCET) 7.5-325 mg/15 ml solution   Oral   Take 10 mLs by mouth 4 (four) times daily as needed for moderate pain.   120 mL   0   . loratadine (CLARITIN) 10 MG tablet   Oral   Take 10 mg by mouth daily.         . metoprolol succinate (TOPROL-XL) 50 MG 24 hr tablet   Oral   Take 50 mg by mouth daily. Take with or immediately following a meal.         . potassium chloride SA (K-DUR,KLOR-CON) 20 MEQ tablet   Oral   Take 20 mEq by mouth daily.         . ranitidine (ZANTAC) 150 MG tablet   Oral   Take 150 mg by mouth daily.         . Saxagliptin-Metformin (KOMBIGLYZE XR) 5-500 MG TB24   Oral   Take 1 tablet by mouth daily.          . simvastatin (ZOCOR) 10 MG tablet   Oral   Take 10 mg by mouth daily.          BP 126/79  Pulse 84  Temp(Src) 98.8 F (37.1 C) (Oral)  Resp 18  SpO2 97% Physical Exam  Nursing note and vitals reviewed.  53 year old female, resting comfortably  and in no acute distress. Vital signs are normal. Oxygen saturation is 97%, which is normal. Head is normocephalic and atraumatic. PERRLA, EOMI. Oropharynx is clear. Neck is nontender and supple without adenopathy or JVD. Back is nontender and there is no CVA tenderness. Lungs have diffuse expiratory wheezes without rales or rhonchi. Chest is nontender. Heart has regular rate and rhythm without murmur. Abdomen is soft, flat, nontender without masses or hepatosplenomegaly and peristalsis is normoactive. Extremities have no cyanosis or edema, full range of motion is present. Skin is warm and dry without rash. Neurologic: Mental status is normal, cranial nerves are intact, there are no motor or sensory deficits.  ED Course  Procedures (including critical care time) Imaging Review Dg Chest 2 View  05/16/2013   CLINICAL DATA:  Shortness of breath and cough.  EXAM: CHEST  2 VIEW  COMPARISON:  None.  FINDINGS: Heart size and mediastinal contours are within normal limits. Both lungs are clear. Visualized skeletal structures are unremarkable.  IMPRESSION: Negative exam.   Electronically Signed   By: Drusilla Kannerhomas  Dalessio M.D.   On: 05/16/2013 03:52    MDM   Final diagnoses:  Bronchitis    Respiratory tract infection with bronchospasm. Chest x-ray will be obtained to rule out pneumonia. She's given epidural with ipratropium via nebulizer. CBC will be checked. If blood sugar is well controlled, may consider a short course of steroids. Old records are reviewed confirming ED visit 4 days ago and chest x-ray which did not show evidence of pneumonia.  She felt somewhat better after an initial nebulizer treatment. On auscultation, lungs were much clearer but there is still some residual wheezing-SRC on the left. She's given a second albuterol with ipratropium nebulizer treatment with further improvement. She's given a dose of dexamethasone and discharged with an albuterol inhaler to take home with her. She's  to follow up with her PCP.  Dione Boozeavid Danni Leabo, MD 05/16/13 [phone]

## 2013-06-28 ENCOUNTER — Encounter (INDEPENDENT_AMBULATORY_CARE_PROVIDER_SITE_OTHER): Payer: Self-pay

## 2013-06-28 ENCOUNTER — Ambulatory Visit
Admission: RE | Admit: 2013-06-28 | Discharge: 2013-06-28 | Disposition: A | Discharge status: Home / Self Care | Payer: BC Managed Care – PPO | Source: Ambulatory Visit | Attending: Internal Medicine | Admitting: Internal Medicine

## 2013-06-28 DIAGNOSIS — Z1231 Encounter for screening mammogram for malignant neoplasm of breast: Secondary | ICD-10-CM

## 2013-07-19 ENCOUNTER — Other Ambulatory Visit (HOSPITAL_COMMUNITY): Payer: Self-pay | Admitting: Internal Medicine

## 2013-07-19 ENCOUNTER — Ambulatory Visit (HOSPITAL_COMMUNITY)
Admission: RE | Admit: 2013-07-19 | Discharge: 2013-07-19 | Disposition: A | Discharge status: Home / Self Care | Payer: BC Managed Care – PPO | Source: Ambulatory Visit | Attending: Internal Medicine | Admitting: Internal Medicine

## 2013-07-19 DIAGNOSIS — M25569 Pain in unspecified knee: Secondary | ICD-10-CM | POA: Insufficient documentation

## 2013-10-08 ENCOUNTER — Encounter (HOSPITAL_COMMUNITY): Payer: Self-pay | Admitting: Emergency Medicine

## 2013-10-08 DIAGNOSIS — E119 Type 2 diabetes mellitus without complications: Secondary | ICD-10-CM | POA: Diagnosis not present

## 2013-10-08 DIAGNOSIS — IMO0002 Reserved for concepts with insufficient information to code with codable children: Secondary | ICD-10-CM | POA: Diagnosis present

## 2013-10-08 DIAGNOSIS — I10 Essential (primary) hypertension: Secondary | ICD-10-CM | POA: Insufficient documentation

## 2013-10-08 DIAGNOSIS — J45909 Unspecified asthma, uncomplicated: Secondary | ICD-10-CM | POA: Diagnosis not present

## 2013-10-08 DIAGNOSIS — Z79899 Other long term (current) drug therapy: Secondary | ICD-10-CM | POA: Diagnosis not present

## 2013-10-08 DIAGNOSIS — F17200 Nicotine dependence, unspecified, uncomplicated: Secondary | ICD-10-CM | POA: Insufficient documentation

## 2013-10-08 NOTE — ED Notes (Signed)
Pt. reports abscess at left axilla onset 3 days ago with no drainage , denies fever .

## 2013-10-09 ENCOUNTER — Emergency Department (HOSPITAL_COMMUNITY)
Admission: EM | Admit: 2013-10-09 | Discharge: 2013-10-09 | Disposition: A | Discharge status: Home / Self Care | Payer: BC Managed Care – PPO | Attending: Emergency Medicine | Admitting: Emergency Medicine

## 2013-10-09 DIAGNOSIS — L0291 Cutaneous abscess, unspecified: Secondary | ICD-10-CM

## 2013-10-09 MED ORDER — HYDROCODONE-ACETAMINOPHEN 5-325 MG PO TABS
ORAL_TABLET | ORAL | Status: AC
  Filled 2013-10-09: qty 1

## 2013-10-09 NOTE — ED Notes (Signed)
See Paper Charting 

## 2013-10-09 NOTE — ED Provider Notes (Signed)
CSN: [id number]     Arrival date & time 10/08/13  2212 History   First MD Initiated Contact with Patient 10/09/13 0440     Chief Complaint  Patient presents with  . Abscess     (Consider location/radiation/quality/duration/timing/severity/associated sxs/prior Treatment) HPI Patient presents with left axillary abscess for the past 3 days. There's been no drainage. She denies any chest systemic symptoms including fever, chills, fatigue. She's had no nausea or vomiting. She has no previous history of abscesses. Past Medical History  Diagnosis Date  . Asthma   . Diabetes mellitus without complication   . Hypertension    Past Surgical History  Procedure Laterality Date  . Breast surgery    . Cesarean section     No family history on file. History  Substance Use Topics  . Smoking status: Current Every Day Smoker -- 0.50 packs/day    Types: Cigarettes  . Smokeless tobacco: Not on file  . Alcohol Use: No   OB History   Grav Para Term Preterm Abortions TAB SAB Ect Mult Living                 Review of Systems  Constitutional: Negative for fever and chills.  Gastrointestinal: Negative for nausea and vomiting.  Neurological: Negative for dizziness, weakness, light-headedness, numbness and headaches.  All other systems reviewed and are negative.     Allergies  Ibuprofen and Sulfonamide derivatives  Home Medications   Prior to Admission medications   Medication Sig Start Date End Date Taking? Authorizing Provider  acetaminophen (TYLENOL) 500 MG tablet Take 1,000 mg by mouth every 6 (six) hours as needed for mild pain.    Historical Provider, MD  albuterol (PROVENTIL HFA;VENTOLIN HFA) 108 (90 BASE) MCG/ACT inhaler Inhale 2 puffs into the lungs every 6 (six) hours as needed for wheezing or shortness of breath.    Historical Provider, MD  amLODipine (NORVASC) 5 MG tablet Take 1 tablet (5 mg total) by mouth daily. 01/01/12   Reuben Likesavid C Keller, MD  benazepril (LOTENSIN) 40 MG tablet  Take 40 mg by mouth daily.    Historical Provider, MD  gabapentin (NEURONTIN) 300 MG capsule Take 300 mg by mouth at bedtime.     Historical Provider, MD  glimepiride (AMARYL) 4 MG tablet Take 4 mg by mouth daily with breakfast.    Historical Provider, MD  hydrochlorothiazide (HYDRODIURIL) 25 MG tablet Take 25 mg by mouth daily.    Historical Provider, MD  HYDROcodone-acetaminophen (HYCET) 7.5-325 mg/15 ml solution Take 10 mLs by mouth 4 (four) times daily as needed for moderate pain. 05/12/13   Fayrene HelperBowie Tran, PA-C  loratadine (CLARITIN) 10 MG tablet Take 10 mg by mouth daily.    Historical Provider, MD  metoprolol succinate (TOPROL-XL) 50 MG 24 hr tablet Take 50 mg by mouth daily. Take with or immediately following a meal.    Historical Provider, MD  potassium chloride SA (K-DUR,KLOR-CON) 20 MEQ tablet Take 20 mEq by mouth daily.    Historical Provider, MD  ranitidine (ZANTAC) 150 MG tablet Take 150 mg by mouth daily.    Historical Provider, MD  Saxagliptin-Metformin (KOMBIGLYZE XR) 5-500 MG TB24 Take 1 tablet by mouth daily.     Historical Provider, MD  simvastatin (ZOCOR) 10 MG tablet Take 10 mg by mouth daily.    Historical Provider, MD   BP 141/86  Pulse 84  Temp(Src) 98.7 F (37.1 C) (Oral)  Resp 18  Ht 5\' 4"  (1.626 m)  Wt 173 lb (78.472 kg)  BMI 29.68 kg/m2  SpO2 95% Physical Exam  Nursing note and vitals reviewed. Constitutional: She is oriented to person, place, and time. She appears well-developed and well-nourished. No distress.  HENT:  Head: Normocephalic and atraumatic.  Mouth/Throat: Oropharynx is clear and moist.  Eyes: EOM are normal. Pupils are equal, round, and reactive to light.  Neck: Normal range of motion. Neck supple.  Cardiovascular: Normal rate and regular rhythm.   Pulmonary/Chest: Effort normal and breath sounds normal. No respiratory distress. She has no wheezes. She has no rales.  Abdominal: Soft. Bowel sounds are normal.  Musculoskeletal: Normal range of  motion. She exhibits no edema and no tenderness.  Patient with 3 cm in diameter fluctuant mass in the left axilla. Overlying erythema. Tenderness to palpation.  Neurological: She is alert and oriented to person, place, and time.  Skin: Skin is warm and dry. No rash noted. No erythema.  Psychiatric: She has a normal mood and affect. Her behavior is normal.    ED Course  INCISION AND DRAINAGE Date/Time: 10/09/2013 7:52 AM Performed by: Loren Racer Authorized by: Ranae Palms, Jonel Weldon Type: abscess Body area: upper extremity Local anesthetic: lidocaine 1% with epinephrine Anesthetic total: 2 ml Scalpel size: 11 Incision type: single straight Complexity: simple Drainage: purulent Drainage amount: moderate Wound treatment: wound left open Patient tolerance: Patient tolerated the procedure well with no immediate complications.   (including critical care time) Labs Review Labs Reviewed - No data to display  Imaging Review No results found.   EKG Interpretation None      MDM   Final diagnoses:  None    Getting questionable overlying cellulitis and the fact the patient is diabetic we'll start antibiotics. Patient is advised to return to the emergency department or follow with her primary Dr. in 2 days to have the wound reassessed.    Loren Racer, MD 10/09/13 [phone]

## 2013-12-17 ENCOUNTER — Emergency Department (HOSPITAL_COMMUNITY)
Admission: EM | Admit: 2013-12-17 | Discharge: 2013-12-18 | Disposition: A | Discharge status: Home / Self Care | Payer: BC Managed Care – PPO | Attending: Emergency Medicine | Admitting: Emergency Medicine

## 2013-12-17 ENCOUNTER — Encounter (HOSPITAL_COMMUNITY): Payer: Self-pay | Admitting: Emergency Medicine

## 2013-12-17 DIAGNOSIS — E1165 Type 2 diabetes mellitus with hyperglycemia: Secondary | ICD-10-CM | POA: Diagnosis not present

## 2013-12-17 DIAGNOSIS — R739 Hyperglycemia, unspecified: Secondary | ICD-10-CM

## 2013-12-17 DIAGNOSIS — J45909 Unspecified asthma, uncomplicated: Secondary | ICD-10-CM | POA: Insufficient documentation

## 2013-12-17 DIAGNOSIS — H538 Other visual disturbances: Secondary | ICD-10-CM | POA: Diagnosis present

## 2013-12-17 DIAGNOSIS — Z720 Tobacco use: Secondary | ICD-10-CM | POA: Insufficient documentation

## 2013-12-17 DIAGNOSIS — I10 Essential (primary) hypertension: Secondary | ICD-10-CM | POA: Diagnosis not present

## 2013-12-17 DIAGNOSIS — M549 Dorsalgia, unspecified: Secondary | ICD-10-CM | POA: Diagnosis not present

## 2013-12-17 DIAGNOSIS — Z79899 Other long term (current) drug therapy: Secondary | ICD-10-CM | POA: Diagnosis not present

## 2013-12-17 LAB — URINALYSIS, ROUTINE W REFLEX MICROSCOPIC
Bilirubin Urine: NEGATIVE
KETONES UR: NEGATIVE mg/dL
LEUKOCYTES UA: NEGATIVE
Nitrite: NEGATIVE
Protein, ur: NEGATIVE mg/dL
SPECIFIC GRAVITY, URINE: 1.033 — AB (ref 1.005–1.030)
Urobilinogen, UA: 0.2 mg/dL (ref 0.0–1.0)
pH: 5 (ref 5.0–8.0)

## 2013-12-17 LAB — CBG MONITORING, ED: GLUCOSE-CAPILLARY: 190 mg/dL — AB (ref 70–99)

## 2013-12-17 LAB — URINE MICROSCOPIC-ADD ON

## 2013-12-17 LAB — CBC
HCT: 43.3 % (ref 36.0–46.0)
Hemoglobin: 15.2 g/dL — ABNORMAL HIGH (ref 12.0–15.0)
MCH: 31.3 pg (ref 26.0–34.0)
MCHC: 35.1 g/dL (ref 30.0–36.0)
MCV: 89.1 fL (ref 78.0–100.0)
Platelets: 227 10*3/uL (ref 150–400)
RBC: 4.86 MIL/uL (ref 3.87–5.11)
RDW: 12.6 % (ref 11.5–15.5)
WBC: 6.9 10*3/uL (ref 4.0–10.5)

## 2013-12-17 LAB — COMPREHENSIVE METABOLIC PANEL
ALBUMIN: 4.3 g/dL (ref 3.5–5.2)
ALK PHOS: 74 U/L (ref 39–117)
ALT: 15 U/L (ref 0–35)
AST: 21 U/L (ref 0–37)
Anion gap: 16 — ABNORMAL HIGH (ref 5–15)
BILIRUBIN TOTAL: 0.4 mg/dL (ref 0.3–1.2)
BUN: 20 mg/dL (ref 6–23)
CHLORIDE: 96 meq/L (ref 96–112)
CO2: 25 mEq/L (ref 19–32)
Calcium: 9.7 mg/dL (ref 8.4–10.5)
Creatinine, Ser: 1.01 mg/dL (ref 0.50–1.10)
GFR calc Af Amer: 73 mL/min — ABNORMAL LOW (ref >90)
GFR calc non Af Amer: 63 mL/min — ABNORMAL LOW (ref >90)
Glucose, Bld: 225 mg/dL — ABNORMAL HIGH (ref 70–99)
POTASSIUM: 3.8 meq/L (ref 3.7–5.3)
Sodium: 137 mEq/L (ref 137–147)
Total Protein: 7.7 g/dL (ref 6.0–8.3)

## 2013-12-17 MED ORDER — TETRACAINE HCL 0.5 % OP SOLN
2.0000 [drp] | Freq: Once | OPHTHALMIC | Status: AC
  Administered 2013-12-18: 2 [drp] via OPHTHALMIC
  Filled 2013-12-17: qty 2

## 2013-12-17 NOTE — ED Notes (Signed)
PA at bedside.

## 2013-12-17 NOTE — ED Notes (Signed)
Ali LoweJenna Gage RN notified of CBG 190mg /dL

## 2013-12-17 NOTE — ED Notes (Signed)
Pt presents with blurred vision since Thursday, denies weakness or neuro symptoms- pt alert and oriented X 4 at present.  Pt also reports mid back pain to the left side for the past week- denies pain with urination.  Denies injury to back.  Pt admits to not taking diabetes medication as prescribed.

## 2013-12-18 ENCOUNTER — Encounter (HOSPITAL_COMMUNITY): Payer: Self-pay | Admitting: Radiology

## 2013-12-18 ENCOUNTER — Emergency Department (HOSPITAL_COMMUNITY): Payer: BC Managed Care – PPO

## 2013-12-18 MED ORDER — SODIUM CHLORIDE 0.9 % IV BOLUS (SEPSIS): 2000.0000 mL | Freq: Once | INTRAVENOUS | Status: DC

## 2013-12-18 NOTE — ED Provider Notes (Signed)
Medical screening examination/treatment/procedure(s) were performed by non-physician practitioner and as supervising physician I was immediately available for consultation/collaboration.   EKG Interpretation None        Inette Doubrava, MD 12/18/13 0716 

## 2013-12-18 NOTE — ED Notes (Signed)
Patient states she does not want to wait to have the fluids infuse.  States she needs to get home.

## 2013-12-18 NOTE — ED Provider Notes (Signed)
CSN: [id number]     Arrival date & time 12/17/13  1951 History   First MD Initiated Contact with Patient 12/17/13 2334     Chief Complaint  Patient presents with  . Blurred Vision     (Consider location/radiation/quality/duration/timing/severity/associated sxs/prior Treatment) The history is provided by the patient and medical records. No language interpreter was used.    Kelly CossDenise A Lueck is a 53 y.o. female  with a hx of asthma, NIDDM, peripheral neuropathy presents to the Emergency Department complaining of an onset, persistent, unchanged blurred vision and left back pain onset 5 days ago.  Patient reports she woke 5 days ago with blurry vision. Her vision is unchanged regardless of use of her glasses. Pt reports 1 week ago she had her A1C checked and it was 12.  Pt was given several samples Invakana and she has been taking this.  Pt reports she does not regularly check her CBG.  No aggravating or alleviating factors for the blurred vision; denies diplopia. Pt reports intermittent headaches for the last several months with the last headache occuring several days ago. Patient denies correlation between her headaches and her blurred vision as they do not occur at the same time..  Pt describes these headaches as a squeezing band.  Pt reports the left back pain began more than 1 week ago, rated at a 3/10, states it felt as if she slept funny.  Patient reports the pain feels tight if she attempts to move the palpation does not change her pain. Pt denies fever, chills, headache, neck pain, chest pain, SOB, abd pain, N/V/D, weakness, dizziness, syncope, dysuria, urinary urgency or frequency.     Past Medical History  Diagnosis Date  . Asthma   . Diabetes mellitus without complication   . Hypertension    Past Surgical History  Procedure Laterality Date  . Breast surgery    . Cesarean section     No family history on file. History  Substance Use Topics  . Smoking status: Current Every Day  Smoker -- 0.50 packs/day    Types: Cigarettes  . Smokeless tobacco: Not on file  . Alcohol Use: No   OB History   Grav Para Term Preterm Abortions TAB SAB Ect Mult Living                 Review of Systems  Constitutional: Negative for fever, diaphoresis, appetite change, fatigue and unexpected weight change.  HENT: Negative for mouth sores.   Eyes: Positive for visual disturbance.  Respiratory: Negative for cough, chest tightness, shortness of breath and wheezing.   Cardiovascular: Negative for chest pain.  Gastrointestinal: Negative for nausea, vomiting, abdominal pain, diarrhea and constipation.  Endocrine: Negative for polydipsia, polyphagia and polyuria.  Genitourinary: Negative for dysuria, urgency, frequency and hematuria.  Musculoskeletal: Positive for back pain. Negative for neck stiffness.  Skin: Negative for rash.  Allergic/Immunologic: Negative for immunocompromised state.  Neurological: Negative for syncope, light-headedness and headaches.  Hematological: Does not bruise/bleed easily.  Psychiatric/Behavioral: Negative for sleep disturbance. The patient is not nervous/anxious.       Allergies  Ibuprofen and Sulfonamide derivatives  Home Medications   Prior to Admission medications   Medication Sig Start Date End Date Taking? Authorizing Provider  albuterol (PROVENTIL HFA;VENTOLIN HFA) 108 (90 BASE) MCG/ACT inhaler Inhale 2 puffs into the lungs every 6 (six) hours as needed for wheezing or shortness of breath.   Yes Historical Provider, MD  amLODipine (NORVASC) 5 MG tablet Take 1 tablet (5 mg  total) by mouth daily. 01/01/12  Yes Reuben Likes, MD  benazepril (LOTENSIN) 40 MG tablet Take 40 mg by mouth daily.   Yes Historical Provider, MD  Canagliflozin-Metformin HCl (INVOKAMET) 150-500 MG TABS Take 1 tablet by mouth 2 (two) times daily.   Yes Historical Provider, MD  gabapentin (NEURONTIN) 300 MG capsule Take 300 mg by mouth at bedtime.    Yes Historical Provider, MD   glimepiride (AMARYL) 4 MG tablet Take 4 mg by mouth daily with breakfast.   Yes Historical Provider, MD  hydrochlorothiazide (HYDRODIURIL) 25 MG tablet Take 25 mg by mouth daily.   Yes Historical Provider, MD  loratadine (CLARITIN) 10 MG tablet Take 10 mg by mouth daily.   Yes Historical Provider, MD  metoprolol succinate (TOPROL-XL) 50 MG 24 hr tablet Take 50 mg by mouth daily. Take with or immediately following a meal.   Yes Historical Provider, MD  Olopatadine HCl (PATADAY) 0.2 % SOLN Place 2 drops into both eyes 4 (four) times daily as needed (allergies/itching).   Yes Historical Provider, MD  OVER THE COUNTER MEDICATION Take 1 tablet by mouth daily. estroblend   Yes Historical Provider, MD  potassium chloride SA (K-DUR,KLOR-CON) 20 MEQ tablet Take 20 mEq by mouth daily.   Yes Historical Provider, MD  ranitidine (ZANTAC) 300 MG tablet Take 300 mg by mouth every morning.   Yes Historical Provider, MD  simvastatin (ZOCOR) 10 MG tablet Take 10 mg by mouth daily.   Yes Historical Provider, MD   BP 101/68  Pulse 64  Temp(Src) 98.1 F (36.7 C) (Oral)  Resp 14  Ht 5\' 4"  (1.626 m)  Wt 170 lb (77.111 kg)  BMI 29.17 kg/m2  SpO2 98% Physical Exam  Nursing note and vitals reviewed. Constitutional: She is oriented to person, place, and time. She appears well-developed and well-nourished. No distress.  Awake, alert, nontoxic appearance  HENT:  Head: Normocephalic and atraumatic.  Nose: Nose normal. No mucosal edema or rhinorrhea.  Mouth/Throat: Uvula is midline, oropharynx is clear and moist and mucous membranes are normal. No uvula swelling. No oropharyngeal exudate, posterior oropharyngeal edema, posterior oropharyngeal erythema or tonsillar abscesses.  Eyes: Conjunctivae, EOM and lids are normal. Pupils are equal, round, and reactive to light. Lids are everted and swept, no foreign bodies found. Right eye exhibits no chemosis, no discharge and no exudate. No foreign body present in the right  eye. Left eye exhibits no chemosis, no discharge and no exudate. No foreign body present in the left eye. Right conjunctiva is not injected. Right conjunctiva has no hemorrhage. Left conjunctiva is not injected. Left conjunctiva has no hemorrhage. No scleral icterus.  Slit lamp exam:      The right eye shows no corneal abrasion, no corneal flare, no corneal ulcer, no foreign body, no fluorescein uptake and no anterior chamber bulge.       The left eye shows no corneal abrasion, no corneal flare, no corneal ulcer, no foreign body, no fluorescein uptake and no anterior chamber bulge.  Pupils equal round and reactive to light No vertical, horizontal or rotational nystagmus No visible foreign body No corneal flare, ulcer or dendritic staining  No herpetic lesions to the face or around the eye  Tono:  L: 12 R:14  Visual Acuity (unchanged with and without glasses):  Bilateral Distance: 20/50  R Distance: 20/50  L Distance: 20/50  Neck: Normal range of motion. Neck supple.  Full range of motion without pain No midline or paraspinal tenderness No nuchal  rigidity; no meningeal signs  Cardiovascular: Normal rate, regular rhythm, normal heart sounds and intact distal pulses.   No murmur heard. Pulmonary/Chest: Effort normal and breath sounds normal. No respiratory distress. She has no wheezes. She has no rales.  Equal chest expansion  Abdominal: Soft. Bowel sounds are normal. She exhibits no distension and no mass. There is no tenderness. There is no rebound and no guarding.  No CVA tenderness  Musculoskeletal: Normal range of motion. She exhibits no edema.  Full range of motion of the T-spine and L-spine No tenderness to palpation of the spinous processes of the T-spine or L-spine No tenderness to palpation of the paraspinous muscles of the L-spine  Lymphadenopathy:    She has no cervical adenopathy.  Neurological: She is alert and oriented to person, place, and time. She has normal reflexes.  No cranial nerve deficit. She exhibits normal muscle tone. Coordination normal.  Mental Status:  Alert, oriented, thought content appropriate. Speech fluent without evidence of aphasia. Able to follow 2 step commands without difficulty.  Cranial Nerves:  II:  Peripheral visual fields grossly normal, pupils equal, round, reactive to light III,IV, VI: ptosis not present, extra-ocular motions intact bilaterally  V,VII: smile symmetric, facial light touch sensation equal VIII: hearing grossly normal bilaterally  IX,X: gag reflex present  XI: bilateral shoulder shrug equal and strong XII: midline tongue extension  Motor:  5/5 in upper and lower extremities bilaterally including strong and equal grip strength and dorsiflexion/plantar flexion Sensory: Pinprick and light touch decreased in bilateral heels, but otherwise normal in all extremities.  Deep Tendon Reflexes: 2+ and symmetric  Cerebellar: normal finger-to-nose with bilateral upper extremities Gait: normal gait and balance CV: distal pulses palpable throughout  No clonus  Skin: Skin is warm and dry. No rash noted. She is not diaphoretic. No erythema.  Psychiatric: She has a normal mood and affect. Her behavior is normal. Judgment and thought content normal.    ED Course  Procedures (including critical care time) Labs Review Labs Reviewed  CBC - Abnormal; Notable for the following:    Hemoglobin 15.2 (*)    All other components within normal limits  COMPREHENSIVE METABOLIC PANEL - Abnormal; Notable for the following:    Glucose, Bld 225 (*)    GFR calc non Af Amer 63 (*)    GFR calc Af Amer 73 (*)    Anion gap 16 (*)    All other components within normal limits  URINALYSIS, ROUTINE W REFLEX MICROSCOPIC - Abnormal; Notable for the following:    Specific Gravity, Urine 1.033 (*)    Glucose, UA >1000 (*)    Hgb urine dipstick SMALL (*)    All other components within normal limits  URINE MICROSCOPIC-ADD ON - Abnormal; Notable for  the following:    Squamous Epithelial / LPF FEW (*)    All other components within normal limits  CBG MONITORING, ED - Abnormal; Notable for the following:    Glucose-Capillary 190 (*)    All other components within normal limits  CBG MONITORING, ED    Imaging Review Ct Head Wo Contrast  12/18/2013   CLINICAL DATA:  Headache and blurred vision.  Elevated sugar.  EXAM: CT HEAD WITHOUT CONTRAST  TECHNIQUE: Contiguous axial images were obtained from the base of the skull through the vertex without intravenous contrast.  COMPARISON:  None.  FINDINGS: Ventricles and sulci appear symmetrical. No mass effect or midline shift. No abnormal extra-axial fluid collections. Gray-white matter junctions are distinct. Basal cisterns  are not effaced. No evidence of acute intracranial hemorrhage. No depressed skull fractures. Mucosal thickening in some of the ethmoid air cells. Mastoid air cells are not opacified.  IMPRESSION: No acute intracranial abnormalities.   Electronically Signed   By: Burman Nieves M.D.   On: 12/18/2013 00:33     EKG Interpretation None      MDM   Final diagnoses:  Hyperglycemia without ketosis  Blurry vision  Essential hypertension   Kelly Santana presents with Hx of NIDDM and 5 days of persistent blurry vision.  Pt with otherwise normal neurologic exam and no focal deficits reported or found.  Labs reassuring.  Pt with hyperglycemia and evidence of dehydration, but no evidence of DKA.  Will obtain CT scan.  12:40am CT without acute abnormality or subacute infarct.  Pt without visual field cuts and eyes are blurry equally bilaterally.  No episodes of transient or sustained vision loss.  Will give fluids and reassess.  2:28 AM Pt now refusing IV fluids reporting that she would like to go home as her daughter who is with her has a doctors appointment in the AM.  I suspect that much of her vision changes is from her hyperglycemia and fluids would help this.  Pt is resting  comfortably at this time in NAD.  VSS.  Pt is to see  opthalmology today and she reports that this will not be a problem.  She is also going to f/u with her PCP this week.  BP 101/68  Pulse 64  Temp(Src) 98.1 F (36.7 C) (Oral)  Resp 14  Ht 5\' 4"  (1.626 m)  Wt 170 lb (77.111 kg)  BMI 29.17 kg/m2  SpO2 98%   I have personally reviewed patient's vitals, nursing note and any pertinent labs or imaging.  I performed an undressed physical exam.    It has been determined that no acute conditions requiring further emergency intervention are present at this time. The patient/guardian have been advised of the diagnosis and plan. I reviewed all labs and imaging including any potential incidental findings.   Vital signs are stable at discharge.   BP 101/68  Pulse 64  Temp(Src) 98.1 F (36.7 C) (Oral)  Resp 14  Ht 5\' 4"  (1.626 m)  Wt 170 lb (77.111 kg)  BMI 29.17 kg/m2  SpO2 98%  The patient was discussed with Dr. Ranae Palms who agrees with the treatment plan.     Dahlia Client Tia Gelb, PA-C 12/18/13 [phone]

## 2013-12-18 NOTE — ED Notes (Signed)
PA made aware that pt does not want to stay for fluids; PA to come see pt.

## 2013-12-18 NOTE — ED Notes (Signed)
RN attempt x 2 for IV start; 2nd RN to attempt IV start  

## 2013-12-18 NOTE — Discharge Instructions (Signed)
1. Medications: usual home medications 2. Treatment: rest, drink plenty of fluids,  3. Follow Up: Please followup with your primary doctor and opthalmology in 3 daysfor discussion of your diagnoses and further evaluation after today's visit; if you do not have a primary care doctor use the resource guide provided to find one; return to emergency department for focal symptoms, worsening vision or other concerning symptoms    Blurred Vision You have been seen today complaining of blurred vision. This means you have a loss of ability to see small details.  CAUSES  Blurred vision can be a symptom of underlying eye problems, such as:  Aging of the eye (presbyopia).  Glaucoma.  Cataracts.  Eye infection.  Eye-related migraine.  Diabetes mellitus.  Fatigue.  Migraine headaches.  High blood pressure.  Breakdown of the back of the eye (macular degeneration).  Problems caused by some medications. The most common cause of blurred vision is the need for eyeglasses or a new prescription. Today in the emergency department, no cause for your blurred vision can be found. SYMPTOMS  Blurred vision is the loss of visual sharpness and detail (acuity). DIAGNOSIS  Should blurred vision continue, you should see your caregiver. If your caregiver is your primary care physician, he or she may choose to refer you to another specialist.  TREATMENT  Do not ignore your blurred vision. Make sure to have it checked out to see if further treatment or referral is necessary. SEEK MEDICAL CARE IF:  You are unable to get into a specialist so we can help you with a referral. SEEK IMMEDIATE MEDICAL CARE IF: You have severe eye pain, severe headache, or sudden loss of vision. MAKE SURE YOU:   Understand these instructions.  Will watch your condition.  Will get help right away if you are not doing well or get worse. Document Released: 02/17/2003 Document Revised: 05/09/2011 Document Reviewed:  09/19/2007 [facility] Patient Information 2015 [employer], [place]. This information is not intended to replace advice given to you by your health care provider. Make sure you discuss any questions you have with your health care provider.

## 2013-12-18 NOTE — ED Notes (Signed)
Patient instructed to call and make an appointment for her blurry vision.

## 2014-01-28 ENCOUNTER — Encounter (HOSPITAL_COMMUNITY): Payer: Self-pay | Admitting: Emergency Medicine

## 2014-01-28 ENCOUNTER — Emergency Department (HOSPITAL_COMMUNITY)
Admission: EM | Admit: 2014-01-28 | Discharge: 2014-01-28 | Disposition: A | Discharge status: Home / Self Care | Payer: BC Managed Care – PPO | Attending: Emergency Medicine | Admitting: Emergency Medicine

## 2014-01-28 ENCOUNTER — Emergency Department (HOSPITAL_COMMUNITY): Payer: BC Managed Care – PPO

## 2014-01-28 DIAGNOSIS — Z79899 Other long term (current) drug therapy: Secondary | ICD-10-CM | POA: Insufficient documentation

## 2014-01-28 DIAGNOSIS — I10 Essential (primary) hypertension: Secondary | ICD-10-CM | POA: Diagnosis not present

## 2014-01-28 DIAGNOSIS — M79641 Pain in right hand: Secondary | ICD-10-CM | POA: Diagnosis present

## 2014-01-28 DIAGNOSIS — Z720 Tobacco use: Secondary | ICD-10-CM | POA: Diagnosis not present

## 2014-01-28 DIAGNOSIS — J45909 Unspecified asthma, uncomplicated: Secondary | ICD-10-CM | POA: Diagnosis not present

## 2014-01-28 DIAGNOSIS — E119 Type 2 diabetes mellitus without complications: Secondary | ICD-10-CM | POA: Diagnosis not present

## 2014-01-28 DIAGNOSIS — M25441 Effusion, right hand: Secondary | ICD-10-CM | POA: Diagnosis not present

## 2014-01-28 DIAGNOSIS — R52 Pain, unspecified: Secondary | ICD-10-CM

## 2014-01-28 LAB — CBC WITH DIFFERENTIAL/PLATELET
BASOS ABS: 0 10*3/uL (ref 0.0–0.1)
Basophils Relative: 0 % (ref 0–1)
Eosinophils Absolute: 0.2 10*3/uL (ref 0.0–0.7)
Eosinophils Relative: 4 % (ref 0–5)
HCT: 43.2 % (ref 36.0–46.0)
Hemoglobin: 14.4 g/dL (ref 12.0–15.0)
LYMPHS PCT: 47 % — AB (ref 12–46)
Lymphs Abs: 2.6 10*3/uL (ref 0.7–4.0)
MCH: 30.1 pg (ref 26.0–34.0)
MCHC: 33.3 g/dL (ref 30.0–36.0)
MCV: 90.2 fL (ref 78.0–100.0)
Monocytes Absolute: 0.5 10*3/uL (ref 0.1–1.0)
Monocytes Relative: 9 % (ref 3–12)
NEUTROS PCT: 40 % — AB (ref 43–77)
Neutro Abs: 2.3 10*3/uL (ref 1.7–7.7)
PLATELETS: 207 10*3/uL (ref 150–400)
RBC: 4.79 MIL/uL (ref 3.87–5.11)
RDW: 12.6 % (ref 11.5–15.5)
WBC: 5.7 10*3/uL (ref 4.0–10.5)

## 2014-01-28 LAB — BASIC METABOLIC PANEL
Anion gap: 13 (ref 5–15)
BUN: 16 mg/dL (ref 6–23)
CALCIUM: 9.5 mg/dL (ref 8.4–10.5)
CHLORIDE: 98 meq/L (ref 96–112)
CO2: 29 mEq/L (ref 19–32)
CREATININE: 0.97 mg/dL (ref 0.50–1.10)
GFR calc non Af Amer: 66 mL/min — ABNORMAL LOW (ref >90)
GFR, EST AFRICAN AMERICAN: 76 mL/min — AB (ref >90)
Glucose, Bld: 189 mg/dL — ABNORMAL HIGH (ref 70–99)
Potassium: 4 mEq/L (ref 3.7–5.3)
Sodium: 140 mEq/L (ref 137–147)

## 2014-01-28 LAB — URIC ACID: URIC ACID, SERUM: 5.6 mg/dL (ref 2.4–7.0)

## 2014-01-28 MED ORDER — DOXYCYCLINE HYCLATE 100 MG PO CAPS: 100.0000 mg | ORAL_CAPSULE | Freq: Two times a day (BID) | ORAL | Status: DC

## 2014-01-28 MED ORDER — COLCHICINE 0.6 MG PO TABS
0.6000 mg | ORAL_TABLET | Freq: Once | ORAL | Status: AC
  Administered 2014-01-28: 0.6 mg via ORAL
  Filled 2014-01-28: qty 1

## 2014-01-28 MED ORDER — FLUCONAZOLE 150 MG PO TABS: 150.0000 mg | ORAL_TABLET | Freq: Once | ORAL | Status: DC

## 2014-01-28 MED ORDER — COLCHICINE 0.6 MG PO TABS: 0.6000 mg | ORAL_TABLET | Freq: Two times a day (BID) | ORAL | Status: DC

## 2014-01-28 MED ORDER — DOXYCYCLINE HYCLATE 100 MG PO TABS
100.0000 mg | ORAL_TABLET | Freq: Once | ORAL | Status: AC
  Administered 2014-01-28: 100 mg via ORAL
  Filled 2014-01-28: qty 1

## 2014-01-28 MED ORDER — PREDNISONE 20 MG PO TABS: ORAL_TABLET | ORAL | Status: DC

## 2014-01-28 MED ORDER — TRAMADOL HCL 50 MG PO TABS: 100.0000 mg | ORAL_TABLET | Freq: Four times a day (QID) | ORAL | Status: DC | PRN

## 2014-01-28 MED ORDER — PREDNISONE 20 MG PO TABS
60.0000 mg | ORAL_TABLET | Freq: Once | ORAL | Status: AC
  Administered 2014-01-28: 60 mg via ORAL
  Filled 2014-01-28 (×2): qty 3

## 2014-01-28 NOTE — ED Notes (Signed)
Pt woke up yesterday with right hand pain that radiates into wrist; right knuckle swollen and red, tender on palpation; pt has no hx of gout but does report eating lots of red meat lately.

## 2014-01-28 NOTE — ED Provider Notes (Signed)
CSN: [id number]     Arrival date & time 01/28/14  0709 History   First MD Initiated Contact with Patient 01/28/14 0725     Chief Complaint  Patient presents with  . Hand Pain     (Consider location/radiation/quality/duration/timing/severity/associated sxs/prior Treatment) HPI  Patient reports yesterday she started noticing some pain in the MCP joint of her right index finger with possibly some mild swelling. She reports this morning she has more pain and more swelling and now the area looks red. She states it hurts with range of motion of her finger. She denies fever or chills. She denies any known trauma. She denies having any joint problems before including gout. She denies any family history of gout. She states her only medication change was she was started on invokamet a few months ago.  PCP Dr Concepcion Elk  Past Medical History  Diagnosis Date  . Asthma   . Diabetes mellitus without complication   . Hypertension    Past Surgical History  Procedure Laterality Date  . Breast surgery    . Cesarean section     History reviewed. No pertinent family history. History  Substance Use Topics  . Smoking status: Current Every Day Smoker -- 0.50 packs/day    Types: Cigarettes  . Smokeless tobacco: Not on file  . Alcohol Use: No   Lives at home Employed Down to 3 cigarettes a day  OB History    No data available     Review of Systems  All other systems reviewed and are negative.     Allergies  Ibuprofen and Sulfonamide derivatives  Home Medications   Prior to Admission medications   Medication Sig Start Date End Date Taking? Authorizing Provider  albuterol (PROVENTIL HFA;VENTOLIN HFA) 108 (90 BASE) MCG/ACT inhaler Inhale 2 puffs into the lungs every 6 (six) hours as needed for wheezing or shortness of breath.   Yes Historical Provider, MD  amLODipine (NORVASC) 5 MG tablet Take 1 tablet (5 mg total) by mouth daily. 01/01/12  Yes Reuben Likes, MD  benazepril (LOTENSIN) 40 MG  tablet Take 40 mg by mouth daily.   Yes Historical Provider, MD  BLACK COHOSH PO Take 1 tablet by mouth daily.   Yes Historical Provider, MD  Canagliflozin-Metformin HCl (INVOKAMET) 150-500 MG TABS Take 1 tablet by mouth 2 (two) times daily.   Yes Historical Provider, MD  gabapentin (NEURONTIN) 300 MG capsule Take 300 mg by mouth at bedtime.    Yes Historical Provider, MD  glimepiride (AMARYL) 4 MG tablet Take 4 mg by mouth daily with breakfast.   Yes Historical Provider, MD  hydrochlorothiazide (HYDRODIURIL) 25 MG tablet Take 25 mg by mouth daily.   Yes Historical Provider, MD  loratadine (CLARITIN) 10 MG tablet Take 10 mg by mouth daily.   Yes Historical Provider, MD  metoprolol succinate (TOPROL-XL) 50 MG 24 hr tablet Take 50 mg by mouth daily. Take with or immediately following a meal.   Yes Historical Provider, MD  Olopatadine HCl (PATADAY) 0.2 % SOLN Place 2 drops into both eyes 4 (four) times daily as needed (allergies/itching).   Yes Historical Provider, MD  potassium chloride SA (K-DUR,KLOR-CON) 20 MEQ tablet Take 20 mEq by mouth daily.   Yes Historical Provider, MD  ranitidine (ZANTAC) 300 MG tablet Take 300 mg by mouth every morning.   Yes Historical Provider, MD  simvastatin (ZOCOR) 10 MG tablet Take 10 mg by mouth daily.   Yes Historical Provider, MD  OVER THE COUNTER MEDICATION Take  1 tablet by mouth daily. estroblend    Historical Provider, MD   BP 115/82 mmHg  Pulse 68  Temp(Src) 98.4 F (36.9 C) (Oral)  Resp 16  SpO2 99%  Vital signs normal   Physical Exam  Constitutional: She is oriented to person, place, and time. She appears well-developed and well-nourished.  Non-toxic appearance. She does not appear ill. No distress.  HENT:  Head: Normocephalic and atraumatic.  Right Ear: External ear normal.  Left Ear: External ear normal.  Nose: Nose normal. No mucosal edema or rhinorrhea.  Mouth/Throat: Mucous membranes are normal. No dental abscesses or uvula swelling.  Eyes:  Conjunctivae and EOM are normal. Pupils are equal, round, and reactive to light.  Neck: Normal range of motion and full passive range of motion without pain. Neck supple.  Pulmonary/Chest: Effort normal. No respiratory distress. She has no rhonchi. She exhibits no crepitus.  Abdominal: Normal appearance.  Musculoskeletal: Normal range of motion. She exhibits edema and tenderness.  Patient is noted to have diffuse swelling and redness over the MCP joint of her right index finger with pain on extension and flexion of the same finger. She does not have pain on range of motion of her middle finger or her thumb. Pulses are intact.   See photo.  Neurological: She is alert and oriented to person, place, and time. She has normal strength. No cranial nerve deficit.  Skin: Skin is warm, dry and intact. No rash noted. No erythema. No pallor.  Psychiatric: She has a normal mood and affect. Her speech is normal and behavior is normal. Her mood appears not anxious.  Nursing note and vitals reviewed.        ED Course  Procedures (including critical care time)  Medications  doxycycline (VIBRA-TABS) tablet 100 mg (not administered)  predniSONE (DELTASONE) tablet 60 mg (60 mg Oral Given 01/28/14 0837)  colchicine tablet 0.6 mg (0.6 mg Oral Given 01/28/14 0829)    During the course of her ED visit patient was able to move her fingers better. I felt she probably had acute gout however her uric acid levels normal. I'm still going to treat her for gout and also add antibiotic in case she is getting a mild early cellulitis. I do not suspect a septic joint at this time.   Labs Review Results for orders placed or performed during the hospital encounter of 01/28/14  Uric acid  Result Value Ref Range   Uric Acid, Serum 5.6 2.4 - 7.0 mg/dL  Basic metabolic panel  Result Value Ref Range   Sodium 140 137 - 147 mEq/L   Potassium 4.0 3.7 - 5.3 mEq/L   Chloride 98 96 - 112 mEq/L   CO2 29 19 - 32 mEq/L    Glucose, Bld 189 (H) 70 - 99 mg/dL   BUN 16 6 - 23 mg/dL   Creatinine, Ser 1.610.97 0.50 - 1.10 mg/dL   Calcium 9.5 8.4 - 09.610.5 mg/dL   GFR calc non Af Amer 66 (L) >90 mL/min   GFR calc Af Amer 76 (L) >90 mL/min   Anion gap 13 5 - 15  CBC with Differential  Result Value Ref Range   WBC 5.7 4.0 - 10.5 K/uL   RBC 4.79 3.87 - 5.11 MIL/uL   Hemoglobin 14.4 12.0 - 15.0 g/dL   HCT 04.543.2 40.936.0 - 81.146.0 %   MCV 90.2 78.0 - 100.0 fL   MCH 30.1 26.0 - 34.0 pg   MCHC 33.3 30.0 - 36.0 g/dL  RDW 12.6 11.5 - 15.5 %   Platelets 207 150 - 400 K/uL   Neutrophils Relative % 40 (L) 43 - 77 %   Neutro Abs 2.3 1.7 - 7.7 K/uL   Lymphocytes Relative 47 (H) 12 - 46 %   Lymphs Abs 2.6 0.7 - 4.0 K/uL   Monocytes Relative 9 3 - 12 %   Monocytes Absolute 0.5 0.1 - 1.0 K/uL   Eosinophils Relative 4 0 - 5 %   Eosinophils Absolute 0.2 0.0 - 0.7 K/uL   Basophils Relative 0 0 - 1 %   Basophils Absolute 0.0 0.0 - 0.1 K/uL   Laboratory interpretation all normal     Imaging Review Dg Hand Complete Right  01/28/2014   CLINICAL DATA:  Pain and swelling second metacarpal region right hand no injury  EXAM: RIGHT HAND - COMPLETE 3+ VIEW  COMPARISON:  None.  FINDINGS: Three views of the right hand submitted. No acute fracture or subluxation. Joint spaces are preserved. No bony erosion. No periosteal reaction.  IMPRESSION: Negative.   Electronically Signed   By: Natasha MeadLiviu  Pop M.D.   On: 01/28/2014 08:32     EKG Interpretation None      MDM   Final diagnoses:  Pain  Swelling joint, finger, hand, right    New Prescriptions   COLCHICINE (COLCRYS) 0.6 MG TABLET    Take 1 tablet (0.6 mg total) by mouth 2 (two) times daily.   DOXYCYCLINE (VIBRAMYCIN) 100 MG CAPSULE    Take 1 capsule (100 mg total) by mouth 2 (two) times daily.   PREDNISONE (DELTASONE) 20 MG TABLET    Take 3 po QD x 3d , then 2 po QD x 3d then 1 po QD x 3d   TRAMADOL (ULTRAM) 50 MG TABLET    Take 2 tablets (100 mg total) by mouth every 6 (six) hours as  needed for moderate pain.    Plan discharge  Devoria AlbeIva Lovell Nuttall, MD, Franz DellFACEP     Auston Halfmann L Rino Hosea, MD 01/28/14 1010

## 2014-01-28 NOTE — Discharge Instructions (Signed)
Elevate your hand. Soak you hand in warm water or use a heating pad for comfort. Also try ice for comfort. Take the medications as prescribed with acetaminophen 1000 mg 4 times a day. Take the antibiotics until gone. Have Dr Concepcion ElkAvbuere recheck you if you aren't improving in the next 48 hrs or sooner if it seems worse.    Heat Therapy Heat therapy can help ease sore, stiff, injured, and tight muscles and joints. Heat relaxes your muscles, which may help ease your pain.  RISKS AND COMPLICATIONS If you have any of the following conditions, do not use heat therapy unless your health care provider has approved:  Poor circulation.  Healing wounds or scarred skin in the area being treated.  Diabetes, heart disease, or high blood pressure.  Not being able to feel (numbness) the area being treated.  Unusual swelling of the area being treated.  Active infections.  Blood clots.  Cancer.  Inability to communicate pain. This may include young children and people who have problems with their brain function (dementia).  Pregnancy. Heat therapy should only be used on old, pre-existing, or long-lasting (chronic) injuries. Do not use heat therapy on new injuries unless directed by your health care provider. HOW TO USE HEAT THERAPY There are several different kinds of heat therapy, including:  Moist heat pack.  Warm water bath.  Hot water bottle.  Electric heating pad.  Heated gel pack.  Heated wrap.  Electric heating pad. Use the heat therapy method suggested by your health care provider. Follow your health care provider's instructions on when and how to use heat therapy. GENERAL HEAT THERAPY RECOMMENDATIONS  Do not sleep while using heat therapy. Only use heat therapy while you are awake.  Your skin may turn pink while using heat therapy. Do not use heat therapy if your skin turns red.  Do not use heat therapy if you have new pain.  High heat or long exposure to heat can cause burns.  Be careful when using heat therapy to avoid burning your skin.  Do not use heat therapy on areas of your skin that are already irritated, such as with a rash or sunburn. SEEK MEDICAL CARE IF:  You have blisters, redness, swelling, or numbness.  You have new pain.  Your pain is worse. MAKE SURE YOU:  Understand these instructions.  Will watch your condition.  Will get help right away if you are not doing well or get worse. Document Released: 05/09/2011 Document Revised: 07/01/2013 Document Reviewed: 04/09/2013 [facility] Patient Information 2015 [place], [place]. This information is not intended to replace advice given to you by your health care provider. Make sure you discuss any questions you have with your health care provider.  Cryotherapy Cryotherapy means treatment with cold. Ice or gel packs can be used to reduce both pain and swelling. Ice is the most helpful within the first 24 to 48 hours after an injury or flare-up from overusing a muscle or joint. Sprains, strains, spasms, burning pain, shooting pain, and aches can all be eased with ice. Ice can also be used when recovering from surgery. Ice is effective, has very few side effects, and is safe for most people to use. PRECAUTIONS  Ice is not a safe treatment option for people with:  Raynaud phenomenon. This is a condition affecting small blood vessels in the extremities. Exposure to cold may cause your problems to return.  Cold hypersensitivity. There are many forms of cold hypersensitivity, including:  Cold urticaria. Red, itchy hives  appear on the skin when the tissues begin to warm after being iced.  Cold erythema. This is a red, itchy rash caused by exposure to cold.  Cold hemoglobinuria. Red blood cells break down when the tissues begin to warm after being iced. The hemoglobin that carry oxygen are passed into the urine because they cannot combine with blood proteins fast enough.  Numbness or altered sensitivity in the  area being iced. If you have any of the following conditions, do not use ice until you have discussed cryotherapy with your caregiver:  Heart conditions, such as arrhythmia, angina, or chronic heart disease.  High blood pressure.  Healing wounds or open skin in the area being iced.  Current infections.  Rheumatoid arthritis.  Poor circulation.  Diabetes. Ice slows the blood flow in the region it is applied. This is beneficial when trying to stop inflamed tissues from spreading irritating chemicals to surrounding tissues. However, if you expose your skin to cold temperatures for too long or without the proper protection, you can damage your skin or nerves. Watch for signs of skin damage due to cold. HOME CARE INSTRUCTIONS Follow these tips to use ice and cold packs safely.  Place a dry or damp towel between the ice and skin. A damp towel will cool the skin more quickly, so you may need to shorten the time that the ice is used.  For a more rapid response, add gentle compression to the ice.  Ice for no more than 10 to 20 minutes at a time. The bonier the area you are icing, the less time it will take to get the benefits of ice.  Check your skin after 5 minutes to make sure there are no signs of a poor response to cold or skin damage.  Rest 20 minutes or more between uses.  Once your skin is numb, you can end your treatment. You can test numbness by very lightly touching your skin. The touch should be so light that you do not see the skin dimple from the pressure of your fingertip. When using ice, most people will feel these normal sensations in this order: cold, burning, aching, and numbness.  Do not use ice on someone who cannot communicate their responses to pain, such as small children or people with dementia. HOW TO MAKE AN ICE PACK Ice packs are the most common way to use ice therapy. Other methods include ice massage, ice baths, and cryosprays. Muscle creams that cause a cold,  tingly feeling do not offer the same benefits that ice offers and should not be used as a substitute unless recommended by your caregiver. To make an ice pack, do one of the following:  Place crushed ice or a bag of frozen vegetables in a sealable plastic bag. Squeeze out the excess air. Place this bag inside another plastic bag. Slide the bag into a pillowcase or place a damp towel between your skin and the bag.  Mix 3 parts water with 1 part rubbing alcohol. Freeze the mixture in a sealable plastic bag. When you remove the mixture from the freezer, it will be slushy. Squeeze out the excess air. Place this bag inside another plastic bag. Slide the bag into a pillowcase or place a damp towel between your skin and the bag. SEEK MEDICAL CARE IF:  You develop white spots on your skin. This may give the skin a blotchy (mottled) appearance.  Your skin turns blue or pale.  Your skin becomes waxy or hard.  Your swelling gets worse. MAKE SURE YOU:   Understand these instructions.  Will watch your condition.  Will get help right away if you are not doing well or get worse. Document Released: 10/11/2010 Document Revised: 07/01/2013 Document Reviewed: 10/11/2010 [facility] Patient Information 2015 [place], [place]. This information is not intended to replace advice given to you by your health care provider. Make sure you discuss any questions you have with your health care provider.

## 2014-04-22 ENCOUNTER — Emergency Department (HOSPITAL_COMMUNITY)
Admission: EM | Admit: 2014-04-22 | Discharge: 2014-04-22 | Disposition: A | Discharge status: Home / Self Care | Payer: BLUE CROSS/BLUE SHIELD | Attending: Emergency Medicine | Admitting: Emergency Medicine

## 2014-04-22 ENCOUNTER — Encounter (HOSPITAL_COMMUNITY): Payer: Self-pay | Admitting: Emergency Medicine

## 2014-04-22 DIAGNOSIS — Z7952 Long term (current) use of systemic steroids: Secondary | ICD-10-CM | POA: Diagnosis not present

## 2014-04-22 DIAGNOSIS — I10 Essential (primary) hypertension: Secondary | ICD-10-CM | POA: Insufficient documentation

## 2014-04-22 DIAGNOSIS — Z792 Long term (current) use of antibiotics: Secondary | ICD-10-CM | POA: Insufficient documentation

## 2014-04-22 DIAGNOSIS — J45909 Unspecified asthma, uncomplicated: Secondary | ICD-10-CM | POA: Insufficient documentation

## 2014-04-22 DIAGNOSIS — H578 Other specified disorders of eye and adnexa: Secondary | ICD-10-CM | POA: Diagnosis not present

## 2014-04-22 DIAGNOSIS — L409 Psoriasis, unspecified: Secondary | ICD-10-CM | POA: Diagnosis not present

## 2014-04-22 DIAGNOSIS — X58XXXA Exposure to other specified factors, initial encounter: Secondary | ICD-10-CM | POA: Diagnosis not present

## 2014-04-22 DIAGNOSIS — Y9389 Activity, other specified: Secondary | ICD-10-CM | POA: Diagnosis not present

## 2014-04-22 DIAGNOSIS — Y9289 Other specified places as the place of occurrence of the external cause: Secondary | ICD-10-CM | POA: Insufficient documentation

## 2014-04-22 DIAGNOSIS — Y998 Other external cause status: Secondary | ICD-10-CM | POA: Insufficient documentation

## 2014-04-22 DIAGNOSIS — T7840XA Allergy, unspecified, initial encounter: Secondary | ICD-10-CM | POA: Diagnosis present

## 2014-04-22 DIAGNOSIS — E119 Type 2 diabetes mellitus without complications: Secondary | ICD-10-CM | POA: Insufficient documentation

## 2014-04-22 DIAGNOSIS — Z720 Tobacco use: Secondary | ICD-10-CM | POA: Diagnosis not present

## 2014-04-22 DIAGNOSIS — Z79899 Other long term (current) drug therapy: Secondary | ICD-10-CM | POA: Insufficient documentation

## 2014-04-22 MED ORDER — PREDNISONE 20 MG PO TABS
60.0000 mg | ORAL_TABLET | Freq: Once | ORAL | Status: AC
  Administered 2014-04-22: 60 mg via ORAL
  Filled 2014-04-22: qty 3

## 2014-04-22 MED ORDER — PREDNISONE 10 MG PO TABS: ORAL_TABLET | ORAL | Status: DC

## 2014-04-22 MED ORDER — FAMOTIDINE 20 MG PO TABS
20.0000 mg | ORAL_TABLET | Freq: Once | ORAL | Status: AC
  Administered 2014-04-22: 20 mg via ORAL
  Filled 2014-04-22: qty 1

## 2014-04-22 NOTE — ED Notes (Signed)
Declined W/C at D/C and was escorted to lobby by RN. 

## 2014-04-22 NOTE — ED Notes (Signed)
Reports allergic reaction, eyes red and itchy, no other symptoms, no oral swelling, no resp distress, Benadryl pta, A/OX4, ambulatory and in NAD

## 2014-04-22 NOTE — Discharge Instructions (Signed)
Continue benadryl and pepcid. Take prednisone until all gone. Follow up with your doctor. Return if worsening.   Allergies Allergies may happen from anything your body is sensitive to. This may be food, medicines, pollens, chemicals, and nearly anything around you in everyday life that produces allergens. An allergen is anything that causes an allergy producing substance. Heredity is often a factor in causing these problems. This means you may have some of the same allergies as your parents. Food allergies happen in all age groups. Food allergies are some of the most severe and life threatening. Some common food allergies are cow's milk, seafood, eggs, nuts, wheat, and soybeans. SYMPTOMS   Swelling around the mouth.  An itchy red rash or hives.  Vomiting or diarrhea.  Difficulty breathing. SEVERE ALLERGIC REACTIONS ARE LIFE-THREATENING. This reaction is called anaphylaxis. It can cause the mouth and throat to swell and cause difficulty with breathing and swallowing. In severe reactions only a trace amount of food (for example, peanut oil in a salad) may cause death within seconds. Seasonal allergies occur in all age groups. These are seasonal because they usually occur during the same season every year. They may be a reaction to molds, grass pollens, or tree pollens. Other causes of problems are house dust mite allergens, pet dander, and mold spores. The symptoms often consist of nasal congestion, a runny itchy nose associated with sneezing, and tearing itchy eyes. There is often an associated itching of the mouth and ears. The problems happen when you come in contact with pollens and other allergens. Allergens are the particles in the air that the body reacts to with an allergic reaction. This causes you to release allergic antibodies. Through a chain of events, these eventually cause you to release histamine into the blood stream. Although it is meant to be protective to the body, it is this  release that causes your discomfort. This is why you were given anti-histamines to feel better. If you are unable to pinpoint the offending allergen, it may be determined by skin or blood testing. Allergies cannot be cured but can be controlled with medicine. Hay fever is a collection of all or some of the seasonal allergy problems. It may often be treated with simple over-the-counter medicine such as diphenhydramine. Take medicine as directed. Do not drink alcohol or drive while taking this medicine. Check with your caregiver or package insert for child dosages. If these medicines are not effective, there are many new medicines your caregiver can prescribe. Stronger medicine such as nasal spray, eye drops, and corticosteroids may be used if the first things you try do not work well. Other treatments such as immunotherapy or desensitizing injections can be used if all else fails. Follow up with your caregiver if problems continue. These seasonal allergies are usually not life threatening. They are generally more of a nuisance that can often be handled using medicine. HOME CARE INSTRUCTIONS   If unsure what causes a reaction, keep a diary of foods eaten and symptoms that follow. Avoid foods that cause reactions.  If hives or rash are present:  Take medicine as directed.  You may use an over-the-counter antihistamine (diphenhydramine) for hives and itching as needed.  Apply cold compresses (cloths) to the skin or take baths in cool water. Avoid hot baths or showers. Heat will make a rash and itching worse.  If you are severely allergic:  Following a treatment for a severe reaction, hospitalization is often required for closer follow-up.  Wear a medic-alert  bracelet or necklace stating the allergy.  You and your family must learn how to give adrenaline or use an anaphylaxis kit.  If you have had a severe reaction, always carry your anaphylaxis kit or EpiPen with you. Use this medicine as  directed by your caregiver if a severe reaction is occurring. Failure to do so could have a fatal outcome. SEEK MEDICAL CARE IF:  You suspect a food allergy. Symptoms generally happen within 30 minutes of eating a food.  Your symptoms have not gone away within 2 days or are getting worse.  You develop new symptoms.  You want to retest yourself or your child with a food or drink you think causes an allergic reaction. Never do this if an anaphylactic reaction to that food or drink has happened before. Only do this under the care of a caregiver. SEEK IMMEDIATE MEDICAL CARE IF:   You have difficulty breathing, are wheezing, or have a tight feeling in your chest or throat.  You have a swollen mouth, or you have hives, swelling, or itching all over your body.  You have had a severe reaction that has responded to your anaphylaxis kit or an EpiPen. These reactions may return when the medicine has worn off. These reactions should be considered life threatening. MAKE SURE YOU:   Understand these instructions.  Will watch your condition.  Will get help right away if you are not doing well or get worse. Document Released: 05/10/2002 Document Revised: 06/11/2012 Document Reviewed: 10/15/2007 [facility] Patient Information 2015 [place], [place]. This information is not intended to replace advice given to you by your health care provider. Make sure you discuss any questions you have with your health care provider.

## 2014-04-22 NOTE — ED Provider Notes (Signed)
CSN: [id number]     Arrival date & time 04/22/14  2027 History  This chart was scribed for non-physician practitioner Lottie Musselatyana A Fraidy Mccarrick, PA-C working with Glynn OctaveStephen Rancour, MD by Conchita ParisNadim Abuhashem, ED Scribe. This patient was seen in TR08C/TR08C and the patient's care was started at 9:39 PM.   Chief Complaint  Patient presents with  . Allergic Reaction   Patient is a 54 y.o. female presenting with allergic reaction. The history is provided by the patient. No language interpreter was used.  Allergic Reaction Presenting symptoms: no difficulty swallowing    HPI Comments: Kelly Santana is a 54 y.o. female with a history of HTN, asthma and DM who presents to the Emergency Department complaining of an allergic reaction due to something she ate tonight around 8:00 PM. Pt had chicken liver, corn bread, cabbage and rice for dinner. First her right eye began swelling then her left eye. Pt has eye rednes and itching as associated symptoms. She took a benadryl which did not provide relief so she took another 1 hour pta. Pt has had several allergic reactions due to foods in the past and has been seen by an allergy specialist who told her she is allergic to several foods. 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 No throat swelling, lip swelling, tongue swelling, ear swelling, SOB.   Past Medical History  Diagnosis Date  . Asthma   . Diabetes mellitus without complication   . Hypertension    Past Surgical History  Procedure Laterality Date  . Breast surgery    . Cesarean section     No family history on file. History  Substance Use Topics  . Smoking status: Current Every Day Smoker -- 0.50 packs/day    Types: Cigarettes  . Smokeless tobacco: Not on file  . Alcohol Use: No   OB History    No data available     Review of Systems  HENT: Negative for facial swelling and trouble swallowing.   Eyes: Positive for redness and itching.       Positive for eye swelling.  Respiratory: Negative for shortness  of breath.     Allergies  Ibuprofen and Sulfonamide derivatives  Home Medications   Prior to Admission medications   Medication Sig Start Date End Date Taking? Authorizing Provider  albuterol (PROVENTIL HFA;VENTOLIN HFA) 108 (90 BASE) MCG/ACT inhaler Inhale 2 puffs into the lungs every 6 (six) hours as needed for wheezing or shortness of breath.    Historical Provider, MD  amLODipine (NORVASC) 5 MG tablet Take 1 tablet (5 mg total) by mouth daily. 01/01/12   Reuben Likesavid C Keller, MD  benazepril (LOTENSIN) 40 MG tablet Take 40 mg by mouth daily.    Historical Provider, MD  BLACK COHOSH PO Take 1 tablet by mouth daily.    Historical Provider, MD  Canagliflozin-Metformin HCl (INVOKAMET) 150-500 MG TABS Take 1 tablet by mouth 2 (two) times daily.    Historical Provider, MD  colchicine (COLCRYS) 0.6 MG tablet Take 1 tablet (0.6 mg total) by mouth 2 (two) times daily. 01/28/14   Ward GivensIva L Knapp, MD  doxycycline (VIBRAMYCIN) 100 MG capsule Take 1 capsule (100 mg total) by mouth 2 (two) times daily. 01/28/14   Ward GivensIva L Knapp, MD  fluconazole (DIFLUCAN) 150 MG tablet Take 1 tablet (150 mg total) by mouth once. 01/28/14   Ward GivensIva L Knapp, MD  gabapentin (NEURONTIN) 300 MG capsule Take 300 mg by mouth at bedtime.     Historical Provider, MD  glimepiride (  AMARYL) 4 MG tablet Take 4 mg by mouth daily with breakfast.    Historical Provider, MD  hydrochlorothiazide (HYDRODIURIL) 25 MG tablet Take 25 mg by mouth daily.    Historical Provider, MD  loratadine (CLARITIN) 10 MG tablet Take 10 mg by mouth daily.    Historical Provider, MD  metoprolol succinate (TOPROL-XL) 50 MG 24 hr tablet Take 50 mg by mouth daily. Take with or immediately following a meal.    Historical Provider, MD  Olopatadine HCl (PATADAY) 0.2 % SOLN Place 2 drops into both eyes 4 (four) times daily as needed (allergies/itching).    Historical Provider, MD  OVER THE COUNTER MEDICATION Take 1 tablet by mouth daily. estroblend    Historical Provider, MD   potassium chloride SA (K-DUR,KLOR-CON) 20 MEQ tablet Take 20 mEq by mouth daily.    Historical Provider, MD  predniSONE (DELTASONE) 20 MG tablet Take 3 po QD x 3d , then 2 po QD x 3d then 1 po QD x 3d 01/29/14   Ward Givens, MD  ranitidine (ZANTAC) 300 MG tablet Take 300 mg by mouth every morning.    Historical Provider, MD  simvastatin (ZOCOR) 10 MG tablet Take 10 mg by mouth daily.    Historical Provider, MD  traMADol (ULTRAM) 50 MG tablet Take 2 tablets (100 mg total) by mouth every 6 (six) hours as needed for moderate pain. 01/28/14   Ward Givens, MD   BP 125/84 mmHg  Pulse 77  Temp(Src) 98.7 F (37.1 C) (Oral)  Resp 16  Ht  (1.651 m)  Wt 170 lb (77.111 kg)  BMI 28.29 kg/m2  SpO2 99% Physical Exam  Constitutional: She is oriented to person, place, and time. She appears well-developed and well-nourished. No distress.  HENT:  Head: Normocephalic and atraumatic.  Right Ear: External ear normal.  Left Ear: External ear normal.  Nose: Nose normal.  Mouth/Throat: Oropharynx is clear and moist.  TMs are normal bilaterally  Eyes: Conjunctivae are normal. Pupils are equal, round, and reactive to light.  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 Eyes are watery. No tenderness to palpation.  Neck: Normal range of motion. Neck supple.  Cardiovascular: Normal rate, regular rhythm and normal heart sounds.   Pulmonary/Chest: Effort normal and breath sounds normal. No respiratory distress. She has no wheezes. She has no rales.  No stridor  Abdominal: She exhibits no distension.  Musculoskeletal: Normal range of motion.  Neurological: She is alert and oriented to person, place, and time.  Skin: Skin is warm and dry.  Psychiatric: She has a normal mood and affect. Her behavior is normal.  Nursing note and vitals reviewed.   ED Course  Procedures  DIAGNOSTIC STUDIES: Oxygen Saturation is 99% on room air, normal by my interpretation.    COORDINATION OF  CARE: 9:44 PM Discussed treatment plan with pt at bedside and pt agreed to plan.  Labs Review Labs Reviewed - No data to display  Imaging Review No results found.   EKG Interpretation None      MDM   Final diagnoses:  Allergic reaction, initial encounter   Patient with mild swelling around her eyes and itching of the eyes, watering of the eyes. States similar to prior allergic reactions. No swelling of the lips, tongue, throat. No shortness of breath. Exam unremarkable, no stridor, no respiratory distress. Uvula is nonedematous, tongue is normal. Patient already took 2 Benadryl prior to coming in. Will add prednisone 60 mg and Pepcid  20 mg by mouth. Will monitor for an hour   10:43 PM Patient feeling better. Wants to leave. She has no worsening of the symptoms, and the swelling around her eyes improving. Will discharge home with prednisone taper, Pepcid, Benadryl, follow-up.  Filed Vitals:   04/22/14 2056 04/22/14 2238  BP: 125/84 122/65  Pulse: 77 72  Temp: 98.7 F (37.1 C) 98.3 F (36.8 C)  TempSrc: Oral Oral  Resp: 16 16  Height:  (1.651 m)   Weight: 170 lb (77.111 kg)   SpO2: 99% 100%    I personally performed the services described in this documentation, which was scribed in my presence. The recorded information has been reviewed and is accurate.    Lottie Mussel, PA-C 04/22/14 2244  Glynn Octave, MD 04/22/14 [phone]

## 2014-07-02 ENCOUNTER — Emergency Department (HOSPITAL_COMMUNITY)
Admission: EM | Admit: 2014-07-02 | Discharge: 2014-07-02 | Disposition: A | Discharge status: Home / Self Care | Payer: BLUE CROSS/BLUE SHIELD | Attending: Emergency Medicine | Admitting: Emergency Medicine

## 2014-07-02 ENCOUNTER — Encounter (HOSPITAL_COMMUNITY): Payer: Self-pay | Admitting: *Deleted

## 2014-07-02 DIAGNOSIS — Z720 Tobacco use: Secondary | ICD-10-CM | POA: Insufficient documentation

## 2014-07-02 DIAGNOSIS — J45909 Unspecified asthma, uncomplicated: Secondary | ICD-10-CM | POA: Insufficient documentation

## 2014-07-02 DIAGNOSIS — Z79899 Other long term (current) drug therapy: Secondary | ICD-10-CM | POA: Diagnosis not present

## 2014-07-02 DIAGNOSIS — M79632 Pain in left forearm: Secondary | ICD-10-CM | POA: Insufficient documentation

## 2014-07-02 DIAGNOSIS — I10 Essential (primary) hypertension: Secondary | ICD-10-CM | POA: Insufficient documentation

## 2014-07-02 DIAGNOSIS — M791 Myalgia: Secondary | ICD-10-CM | POA: Insufficient documentation

## 2014-07-02 DIAGNOSIS — E119 Type 2 diabetes mellitus without complications: Secondary | ICD-10-CM | POA: Diagnosis not present

## 2014-07-02 LAB — BASIC METABOLIC PANEL
Anion gap: 11 (ref 5–15)
BUN: 16 mg/dL (ref 6–20)
CO2: 27 mmol/L (ref 22–32)
Calcium: 9.3 mg/dL (ref 8.9–10.3)
Chloride: 101 mmol/L (ref 101–111)
Creatinine, Ser: 1.05 mg/dL — ABNORMAL HIGH (ref 0.44–1.00)
GFR calc non Af Amer: 60 mL/min — ABNORMAL LOW (ref >60)
Glucose, Bld: 184 mg/dL — ABNORMAL HIGH (ref 70–99)
POTASSIUM: 3.8 mmol/L (ref 3.5–5.1)
SODIUM: 139 mmol/L (ref 135–145)

## 2014-07-02 LAB — CBC WITH DIFFERENTIAL/PLATELET
Basophils Absolute: 0.1 10*3/uL (ref 0.0–0.1)
Basophils Relative: 1 % (ref 0–1)
Eosinophils Absolute: 0.2 10*3/uL (ref 0.0–0.7)
Eosinophils Relative: 4 % (ref 0–5)
HCT: 42.1 % (ref 36.0–46.0)
Hemoglobin: 14.5 g/dL (ref 12.0–15.0)
Lymphocytes Relative: 62 % — ABNORMAL HIGH (ref 12–46)
Lymphs Abs: 3.8 10*3/uL (ref 0.7–4.0)
MCH: 31.3 pg (ref 26.0–34.0)
MCHC: 34.4 g/dL (ref 30.0–36.0)
MCV: 90.9 fL (ref 78.0–100.0)
MONO ABS: 0.4 10*3/uL (ref 0.1–1.0)
Monocytes Relative: 6 % (ref 3–12)
NEUTROS PCT: 27 % — AB (ref 43–77)
Neutro Abs: 1.7 10*3/uL (ref 1.7–7.7)
PLATELETS: 209 10*3/uL (ref 150–400)
RBC: 4.63 MIL/uL (ref 3.87–5.11)
RDW: 12.7 % (ref 11.5–15.5)
WBC: 6.2 10*3/uL (ref 4.0–10.5)

## 2014-07-02 MED ORDER — TRAMADOL HCL 50 MG PO TABS: 50.0000 mg | ORAL_TABLET | Freq: Four times a day (QID) | ORAL | Status: DC | PRN

## 2014-07-02 NOTE — ED Provider Notes (Signed)
CSN: [id number]     Arrival date & time 07/02/14  1422 History   First MD Initiated Contact with Patient 07/02/14 1624     Chief Complaint  Patient presents with  . Arm Pain     (Consider location/radiation/quality/duration/timing/severity/associated sxs/prior Treatment) HPI Kelly Santana is a 54 y.o. female with history of diabetes, hypertension, asthma, presents to emergency department with pain in her left forearm. 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 She is on gabapentin. She states her neuropathy causes her to have tingling sensation to the fingertips and feet. She states 2 days ago she developed sharp pain in the left forearm that shoots from the wrist to the elbow. She states it comes and goes. Worsened with movement and activity in the left arm. No numbness or weakness in the hand. She denies any neck pain. No chest pain or shortness of breath. She states she try to call her doctor to see if she could get in but they were all blocked so she decided to come here. No history of the same. No injuries. 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  Past Medical History  Diagnosis Date  . Asthma   . Diabetes mellitus without complication   . Hypertension    Past Surgical History  Procedure Laterality Date  . Breast surgery    . Cesarean section     History reviewed. No pertinent family history. History  Substance Use Topics  . Smoking status: Current Every Day Smoker -- 0.50 packs/day    Types: Cigarettes  . Smokeless tobacco: Not on file  . Alcohol Use: No   OB History    No data available     Review of Systems  Constitutional: Negative for fever and chills.  Respiratory: Negative for cough, chest tightness and shortness of breath.   Cardiovascular: Negative for chest pain, palpitations and leg swelling.  Gastrointestinal: Negative for nausea, vomiting, abdominal pain and diarrhea.  Genitourinary: Negative for dysuria and flank  pain.  Musculoskeletal: Positive for myalgias and arthralgias. Negative for joint swelling, neck pain and neck stiffness.  Skin: Negative for rash.  Neurological: Negative for dizziness, weakness, numbness and headaches.  All other systems reviewed and are negative.     Allergies  Ibuprofen and Sulfonamide derivatives  Home Medications   Prior to Admission medications   Medication Sig Start Date End Date Taking? Authorizing Provider  albuterol (PROVENTIL HFA;VENTOLIN HFA) 108 (90 BASE) MCG/ACT inhaler Inhale 2 puffs into the lungs every 6 (six) hours as needed for wheezing or shortness of breath.    Historical Provider, MD  amLODipine (NORVASC) 5 MG tablet Take 1 tablet (5 mg total) by mouth daily. 01/01/12   Reuben Likes, MD  benazepril (LOTENSIN) 40 MG tablet Take 40 mg by mouth daily.    Historical Provider, MD  BLACK COHOSH PO Take 1 tablet by mouth daily.    Historical Provider, MD  Canagliflozin-Metformin HCl (INVOKAMET) 150-500 MG TABS Take 1 tablet by mouth 2 (two) times daily.    Historical Provider, MD  colchicine (COLCRYS) 0.6 MG tablet Take 1 tablet (0.6 mg total) by mouth 2 (two) times daily. 01/28/14   Devoria Albe, MD  doxycycline (VIBRAMYCIN) 100 MG capsule Take 1 capsule (100 mg total) by mouth 2 (two) times daily. 01/28/14   Devoria Albe, MD  fluconazole (DIFLUCAN) 150 MG tablet Take 1 tablet (150 mg total) by mouth once. 01/28/14   Devoria Albe, MD  gabapentin (  NEURONTIN) 300 MG capsule Take 300 mg by mouth at bedtime.     Historical Provider, MD  glimepiride (AMARYL) 4 MG tablet Take 4 mg by mouth daily with breakfast.    Historical Provider, MD  hydrochlorothiazide (HYDRODIURIL) 25 MG tablet Take 25 mg by mouth daily.    Historical Provider, MD  loratadine (CLARITIN) 10 MG tablet Take 10 mg by mouth daily.    Historical Provider, MD  metoprolol succinate (TOPROL-XL) 50 MG 24 hr tablet Take 50 mg by mouth daily. Take with or immediately following a meal.    Historical Provider,  MD  Olopatadine HCl (PATADAY) 0.2 % SOLN Place 2 drops into both eyes 4 (four) times daily as needed (allergies/itching).    Historical Provider, MD  OVER THE COUNTER MEDICATION Take 1 tablet by mouth daily. estroblend    Historical Provider, MD  potassium chloride SA (K-DUR,KLOR-CON) 20 MEQ tablet Take 20 mEq by mouth daily.    Historical Provider, MD  predniSONE (DELTASONE) 10 MG tablet Take 5 tab day 1, take 4 tab day 2, take 3 tab day 3, take 2 tab day 4, and take 1 tab day 5 04/22/14   Idella Lamontagne, PA-C  ranitidine (ZANTAC) 300 MG tablet Take 300 mg by mouth every morning.    Historical Provider, MD  simvastatin (ZOCOR) 10 MG tablet Take 10 mg by mouth daily.    Historical Provider, MD  traMADol (ULTRAM) 50 MG tablet Take 2 tablets (100 mg total) by mouth every 6 (six) hours as needed for moderate pain. 01/28/14   Devoria AlbeIva Knapp, MD   BP 111/71 mmHg  Pulse 72  Temp(Src) 98.3 F (36.8 C) (Oral)  Resp 16  Ht 5' 4.5" (1.638 m)  Wt 165 lb (74.844 kg)  BMI 27.90 kg/m2  SpO2 100% Physical Exam  Constitutional: She is oriented to person, place, and time. She appears well-developed and well-nourished. No distress.  HENT:  Head: Normocephalic.  Eyes: Conjunctivae are normal.  Neck: Neck supple.  Cardiovascular: Normal rate, regular rhythm and normal heart sounds.   Pulmonary/Chest: Effort normal and breath sounds normal. No respiratory distress. She has no wheezes. She has no rales.  Musculoskeletal: She exhibits no edema.  ttp over radial left forarm pronator muscles from the radial wrist to the elbow. Full rom of the wrist with 5/5 strength in all direction. Bicep and tricep strength intact. Distal radial pulses intact. Pt is able to spread all fingers, make "OK" sign, extend thumb, oppose thumb to all other fingers. Negative phalens test. Positive finkelsteins test.   Neurological: She is alert and oriented to person, place, and time.  Skin: Skin is warm and dry.  Psychiatric: She has a  normal mood and affect. Her behavior is normal.  Nursing note and vitals reviewed.   ED Course  Procedures (including critical care time) Labs Review Labs Reviewed  CBC WITH DIFFERENTIAL/PLATELET - Abnormal; Notable for the following:    Neutrophils Relative % 27 (*)    Lymphocytes Relative 62 (*)    All other components within normal limits  BASIC METABOLIC PANEL - Abnormal; Notable for the following:    Glucose, Bld 184 (*)    Creatinine, Ser 1.05 (*)    GFR calc non Af Amer 60 (*)    All other components within normal limits    Imaging Review No results found.   EKG Interpretation None      MDM   Final diagnoses:  Pain of left forearm    Patient with  neuropathy from diabetes, presents with sharp pain in left forearm. Exam unremarkable, Filkensteins test is positive. Based on exam and history do not think patient has any evidence of infection, vascular compromise, no neurological deficit. Most likely either tendinitis versus carpal tunnel. Placed in the Velcro splint. Home with tramadol. Labs unremarkable.  Filed Vitals:   07/02/14 1442 07/02/14 1622 07/02/14 1719  BP: 116/76 111/71 123/75  Pulse: 74 72 73  Temp: 98.3 F (36.8 C)  98.1 F (36.7 C)  TempSrc: Oral  Oral  Resp: 18 16 14   Height: 5' 4.5" (1.638 m)    Weight: 165 lb (74.844 kg)    SpO2: 99% 100% 100%     Kelly Crumbleatyana Tarus Briski, PA-C 07/02/14 2245  Bethann BerkshireJoseph Zammit, MD 07/03/14 1323

## 2014-07-02 NOTE — Discharge Instructions (Signed)
Keep your wrist in a splint for at least a week. Avoid any heavy lifting with that hand. Ice, elevate. Take tramadol as prescribed as needed for pain. Follow-up with your doctor. Return if any weakness in the hand, worsening symptoms, any new concerning symptoms.  Carpal Tunnel Syndrome The carpal tunnel is a narrow area located on the palm side of your wrist. The tunnel is formed by the wrist bones and ligaments. Nerves, blood vessels, and tendons pass through the carpal tunnel. Repeated wrist motion or certain diseases may cause swelling within the tunnel. This swelling pinches the main nerve in the wrist (median nerve) and causes the painful hand and arm condition called carpal tunnel syndrome. CAUSES   Repeated wrist motions.  Wrist injuries.  Certain diseases like arthritis, diabetes, alcoholism, hyperthyroidism, and kidney failure.  Obesity.  Pregnancy. SYMPTOMS   A "pins and needles" feeling in your fingers or hand, especially in your thumb, index and middle fingers.  Tingling or numbness in your fingers or hand.  An aching feeling in your entire arm, especially when your wrist and elbow are bent for long periods of time.  Wrist pain that goes up your arm to your shoulder.  Pain that goes down into your palm or fingers.  A weak feeling in your hands. DIAGNOSIS  Your health care provider will take your history and perform a physical exam. An electromyography test may be needed. This test measures electrical signals sent out by your nerves into the muscles. The electrical signals are usually slowed by carpal tunnel syndrome. You may also need X-rays. TREATMENT  Carpal tunnel syndrome may clear up by itself. Your health care provider may recommend a wrist splint or medicine such as a nonsteroidal anti-inflammatory medicine. Cortisone injections may help. Sometimes, surgery may be needed to free the pinched nerve.  HOME CARE INSTRUCTIONS   Take all medicine as directed by your  health care provider. Only take over-the-counter or prescription medicines for pain, discomfort, or fever as directed by your health care provider.  If you were given a splint to keep your wrist from bending, wear it as directed. It is important to wear the splint at night. Wear the splint for as long as you have pain or numbness in your hand, arm, or wrist. This may take 1 to 2 months.  Rest your wrist from any activity that may be causing your pain. If your symptoms are work-related, you may need to talk to your employer about changing to a job that does not require using your wrist.  Put ice on your wrist after long periods of wrist activity.  Put ice in a plastic bag.  Place a towel between your skin and the bag.  Leave the ice on for 15-20 minutes, 03-04 times a day.  Keep all follow-up visits as directed by your health care provider. This includes any orthopedic referrals, physical therapy, and rehabilitation. Any delay in getting necessary care could result in a delay or failure of your condition to heal. SEEK IMMEDIATE MEDICAL CARE IF:   You have new, unexplained symptoms.  Your symptoms get worse and are not helped or controlled with medicines. MAKE SURE YOU:   Understand these instructions.  Will watch your condition.  Will get help right away if you are not doing well or get worse. Document Released: 02/12/2000 Document Revised: 07/01/2013 Document Reviewed: 12/31/2010 [facility] Patient Information 2015 [place], [place]. This information is not intended to replace advice given to you by your health care provider.  Make sure you discuss any questions you have with your health care provider. Tendinitis Tendinitis is swelling and inflammation of the tendons. Tendons are band-like tissues that connect muscle to bone. Tendinitis commonly occurs in the:   Shoulders (rotator cuff).  Heels (Achilles tendon).  Elbows (triceps tendon). CAUSES Tendinitis is usually caused by  overusing the tendon, muscles, and joints involved. When the tissue surrounding a tendon (synovium) becomes inflamed, it is called tenosynovitis. Tendinitis commonly develops in people whose jobs require repetitive motions. SYMPTOMS  Pain.  Tenderness.  Mild swelling. DIAGNOSIS Tendinitis is usually diagnosed by physical exam. Your health care provider may also order X-rays or other imaging tests. TREATMENT Your health care provider may recommend certain medicines or exercises for your treatment. HOME CARE INSTRUCTIONS   Use a sling or splint for as long as directed by your health care provider until the pain decreases.  Put ice on the injured area.  Put ice in a plastic bag.  Place a towel between your skin and the bag.  Leave the ice on for 15-20 minutes, 3-4 times a day, or as directed by your health care provider.  Avoid using the limb while the tendon is painful. Perform gentle range of motion exercises only as directed by your health care provider. Stop exercises if pain or discomfort increase, unless directed otherwise by your health care provider.  Only take over-the-counter or prescription medicines for pain, discomfort, or fever as directed by your health care provider. SEEK MEDICAL CARE IF:   Your pain and swelling increase.  You develop new, unexplained symptoms, especially increased numbness in the hands. MAKE SURE YOU:   Understand these instructions.  Will watch your condition.  Will get help right away if you are not doing well or get worse. Document Released: 02/12/2000 Document Revised: 07/01/2013 Document Reviewed: 05/03/2010 [facility]Exit[facility] Patient Information 2015 [place], [place]. This information is not intended to replace advice given to you by your health care provider. Make sure you discuss any questions you have with your health care provider.

## 2014-07-02 NOTE — ED Notes (Signed)
Pt reports already diagnosed with neuropathy, has pins/needle sensation to legs and arms. Now has sharp pains to left arm today. No acute distress noted at triage.

## 2014-08-11 ENCOUNTER — Other Ambulatory Visit: Payer: Self-pay | Admitting: Internal Medicine

## 2014-08-11 DIAGNOSIS — M810 Age-related osteoporosis without current pathological fracture: Secondary | ICD-10-CM

## 2014-08-11 DIAGNOSIS — E2839 Other primary ovarian failure: Secondary | ICD-10-CM

## 2014-09-06 ENCOUNTER — Encounter (HOSPITAL_COMMUNITY): Payer: Self-pay | Admitting: Emergency Medicine

## 2014-09-06 ENCOUNTER — Emergency Department (INDEPENDENT_AMBULATORY_CARE_PROVIDER_SITE_OTHER)
Admission: EM | Admit: 2014-09-06 | Discharge: 2014-09-06 | Disposition: A | Discharge status: Home / Self Care | Payer: BLUE CROSS/BLUE SHIELD | Source: Home / Self Care

## 2014-09-06 DIAGNOSIS — N390 Urinary tract infection, site not specified: Secondary | ICD-10-CM

## 2014-09-06 DIAGNOSIS — H109 Unspecified conjunctivitis: Secondary | ICD-10-CM

## 2014-09-06 LAB — POCT URINALYSIS DIP (DEVICE)
Bilirubin Urine: NEGATIVE
Glucose, UA: >=1000 mg/dL — AB
Ketones, ur: NEGATIVE mg/dL
Leukocytes, UA: NEGATIVE
NITRITE: NEGATIVE
Protein, ur: 100 mg/dL — AB
SPECIFIC GRAVITY, URINE: 1.015 (ref 1.005–1.030)
UROBILINOGEN UA: 1 mg/dL (ref 0.0–1.0)
pH: 7 (ref 5.0–8.0)

## 2014-09-06 MED ORDER — CIPROFLOXACIN HCL 500 MG PO TABS: 500.0000 mg | ORAL_TABLET | Freq: Two times a day (BID) | ORAL | Status: DC

## 2014-09-06 MED ORDER — NEOMYCIN-POLYMYXIN-DEXAMETH 3.5-10000-0.1 OP SUSP
1.0000 [drp] | Freq: Two times a day (BID) | OPHTHALMIC | Status: DC
Start: 2014-09-06 — End: 2015-03-23

## 2014-09-06 NOTE — ED Notes (Signed)
C/o uti States when she has intercourse she bleeds after States she has burning and urgency when urinating  States she has right eye drainage which is clear States eye is swollen and red

## 2014-09-06 NOTE — ED Provider Notes (Signed)
CSN: [id number]     Arrival date & time 09/06/14  1722 History   First MD Initiated Contact with Patient 09/06/14 1839     Chief Complaint  Patient presents with  . Urinary Tract Infection  . Eye Drainage   (Consider location/radiation/quality/duration/timing/severity/associated sxs/prior Treatment) Patient is a 54 y.o. female presenting with urinary tract infection and conjunctivitis. The history is provided by the patient.  Urinary Tract Infection Pain quality:  Burning Pain severity:  Mild Onset quality:  Sudden Duration:  2 hours Timing:  Constant Chronicity:  New Recent urinary tract infections: no   Relieved by:  Nothing Associated symptoms: no abdominal pain   Conjunctivitis This is a chronic problem. The current episode started more than 1 week ago. The problem occurs daily. The problem has not changed since onset.Pertinent negatives include no chest pain, no abdominal pain, no headaches and no shortness of breath. Nothing aggravates the symptoms. Nothing relieves the symptoms.   Menses started today Past Medical History  Diagnosis Date  . Asthma   . Diabetes mellitus without complication   . Hypertension    Past Surgical History  Procedure Laterality Date  . Breast surgery    . Cesarean section     History reviewed. No pertinent family history. History  Substance Use Topics  . Smoking status: Current Every Day Smoker -- 0.50 packs/day    Types: Cigarettes  . Smokeless tobacco: Not on file  . Alcohol Use: No   OB History    No data available     Review of Systems  Constitutional: Negative.   HENT: Negative.   Eyes: Positive for redness and itching.  Respiratory: Negative.  Negative for shortness of breath.   Cardiovascular: Negative.  Negative for chest pain.  Gastrointestinal: Negative.  Negative for abdominal pain.  Endocrine: Negative.   Genitourinary: Positive for hematuria and vaginal bleeding.  Musculoskeletal: Negative.   Skin: Negative.    Neurological: Negative for headaches.    Allergies  Sulfonamide derivatives and Ibuprofen  Home Medications   Prior to Admission medications   Medication Sig Start Date End Date Taking? Authorizing Provider  acetaminophen (TYLENOL) 650 MG CR tablet Take 1,300 mg by mouth every 8 (eight) hours as needed for pain.    Historical Provider, MD  amLODipine (NORVASC) 5 MG tablet Take 1 tablet (5 mg total) by mouth daily. 01/01/12   Reuben Likesavid C Keller, MD  benazepril (LOTENSIN) 40 MG tablet Take 40 mg by mouth daily.    Historical Provider, MD  Canagliflozin-Metformin HCl (INVOKAMET) 150-500 MG TABS Take 1 tablet by mouth 2 (two) times daily.    Historical Provider, MD  ciprofloxacin (CIPRO) 500 MG tablet Take 1 tablet (500 mg total) by mouth 2 (two) times daily. 09/06/14   Elvina SidleKurt Alfredo Collymore, MD  colchicine (COLCRYS) 0.6 MG tablet Take 1 tablet (0.6 mg total) by mouth 2 (two) times daily. 01/28/14   Devoria AlbeIva Knapp, MD  doxycycline (VIBRAMYCIN) 100 MG capsule Take 1 capsule (100 mg total) by mouth 2 (two) times daily. 01/28/14   Devoria AlbeIva Knapp, MD  fluconazole (DIFLUCAN) 150 MG tablet Take 1 tablet (150 mg total) by mouth once. 01/28/14   Devoria AlbeIva Knapp, MD  gabapentin (NEURONTIN) 300 MG capsule Take 300 mg by mouth at bedtime.     Historical Provider, MD  glimepiride (AMARYL) 4 MG tablet Take 4 mg by mouth daily with breakfast.    Historical Provider, MD  hydrochlorothiazide (HYDRODIURIL) 25 MG tablet Take 25 mg by mouth daily.    Historical  Provider, MD  loratadine (CLARITIN) 10 MG tablet Take 10 mg by mouth daily.    Historical Provider, MD  metoprolol succinate (TOPROL-XL) 50 MG 24 hr tablet Take 50 mg by mouth daily. Take with or immediately following a meal.    Historical Provider, MD  neomycin-polymyxin b-dexamethasone (MAXITROL) 3.5-10000-0.1 SUSP Place 1 drop into both eyes 2 (two) times daily. 09/06/14   Elvina Sidle, MD  Olopatadine HCl (PATADAY) 0.2 % SOLN Place 2 drops into both eyes 4 (four) times daily as  needed (allergies/itching).    Historical Provider, MD  potassium chloride SA (K-DUR,KLOR-CON) 20 MEQ tablet Take 20 mEq by mouth daily.    Historical Provider, MD  predniSONE (DELTASONE) 10 MG tablet Take 5 tab day 1, take 4 tab day 2, take 3 tab day 3, take 2 tab day 4, and take 1 tab day 5 04/22/14   Tatyana Kirichenko, PA-C  ranitidine (ZANTAC) 300 MG tablet Take 300 mg by mouth every morning.    Historical Provider, MD  simvastatin (ZOCOR) 10 MG tablet Take 5 mg by mouth every morning.     Historical Provider, MD  traMADol (ULTRAM) 50 MG tablet Take 1 tablet (50 mg total) by mouth every 6 (six) hours as needed. 07/02/14   Tatyana Kirichenko, PA-C   BP 131/85 mmHg  Pulse 87  Temp(Src) 98.4 F (36.9 C) (Oral)  Resp 12  SpO2 100% Physical Exam  Constitutional: She is oriented to person, place, and time. She appears well-developed and well-nourished.  HENT:  Head: Normocephalic.  Right Ear: External ear normal.  Left Ear: External ear normal.  Nose: Nose normal.  Mouth/Throat: Oropharynx is clear and moist.  Eyes: EOM are normal. Pupils are equal, round, and reactive to light. Right eye exhibits no discharge. Left eye exhibits no discharge. No scleral icterus.  Normal fundi, bilateral injection of vessels in the sclera-worse on the right  Neck: Normal range of motion. Neck supple.  Pulmonary/Chest: Effort normal.  Abdominal: Soft. There is no tenderness.  Neurological: She is alert and oriented to person, place, and time.    ED Course  Procedures (including critical care time) Labs Review Labs Reviewed  POCT URINALYSIS DIP (DEVICE) - Abnormal; Notable for the following:    Glucose, UA >=1000 (*)    Hgb urine dipstick LARGE (*)    Protein, ur 100 (*)    All other components within normal limits  URINE CULTURE    Imaging Review No results found.   MDM   1. UTI (lower urinary tract infection)   2. Bilateral conjunctivitis   3. Bilateral conjunctivitis Chronic  UTI (lower  urinary tract infection) - Plan: ciprofloxacin (CIPRO) 500 MG tablet  Bilateral conjunctivitis - Plan: neomycin-polymyxin b-dexamethasone (MAXITROL) 3.5-10000-0.1 SUSP  Bilateral conjunctivitis, Chronic - Plan: neomycin-polymyxin b-dexamethasone (MAXITROL) 3.5-10000-0.1 SUSP  Elvina Sidle, MD    Elvina Sidle, MD 09/06/14 [phone]

## 2014-09-06 NOTE — Discharge Instructions (Signed)
Bacterial Conjunctivitis °Bacterial conjunctivitis, commonly called pink eye, is an inflammation of the clear membrane that covers the white part of the eye (conjunctiva). The inflammation can also happen on the underside of the eyelids. The blood vessels in the conjunctiva become inflamed, causing the eye to become red or pink. Bacterial conjunctivitis may spread easily from one eye to another and from person to person (contagious).  °CAUSES  °Bacterial conjunctivitis is caused by bacteria. The bacteria may come from your own skin, your upper respiratory tract, or from someone else with bacterial conjunctivitis. °SYMPTOMS  °The normally white color of the eye or the underside of the eyelid is usually pink or red. The pink eye is usually associated with irritation, tearing, and some sensitivity to light. Bacterial conjunctivitis is often associated with a thick, yellowish discharge from the eye. The discharge may turn into a crust on the eyelids overnight, which causes your eyelids to stick together. If a discharge is present, there may also be some blurred vision in the affected eye. °DIAGNOSIS  °Bacterial conjunctivitis is diagnosed by your caregiver through an eye exam and the symptoms that you report. Your caregiver looks for changes in the surface tissues of your eyes, which may point to the specific type of conjunctivitis. A sample of any discharge may be collected on a cotton-tip swab if you have a severe case of conjunctivitis, if your cornea is affected, or if you keep getting repeat infections that do not respond to treatment. The sample will be sent to a lab to see if the inflammation is caused by a bacterial infection and to see if the infection will respond to antibiotic medicines. °TREATMENT  °· Bacterial conjunctivitis is treated with antibiotics. Antibiotic eyedrops are most often used. However, antibiotic ointments are also available. Antibiotics pills are sometimes used. Artificial tears or eye  washes may ease discomfort. °HOME CARE INSTRUCTIONS  °· To ease discomfort, apply a cool, clean washcloth to your eye for 10-20 minutes, 3-4 times a day. °· Gently wipe away any drainage from your eye with a warm, wet washcloth or a cotton ball. °· Wash your hands often with soap and water. Use paper towels to dry your hands. °· Do not share towels or washcloths. This may spread the infection. °· Change or wash your pillowcase every day. °· You should not use eye makeup until the infection is gone. °· Do not operate machinery or drive if your vision is blurred. °· Stop using contact lenses. Ask your caregiver how to sterilize or replace your contacts before using them again. This depends on the type of contact lenses that you use. °· When applying medicine to the infected eye, do not touch the edge of your eyelid with the eyedrop bottle or ointment tube. °SEEK IMMEDIATE MEDICAL CARE IF:  °· Your infection has not improved within 3 days after beginning treatment. °· You had yellow discharge from your eye and it returns. °· You have increased eye pain. °· Your eye redness is spreading. °· Your vision becomes blurred. °· You have a fever or persistent symptoms for more than 2-3 days. °· You have a fever and your symptoms suddenly get worse. °· You have facial pain, redness, or swelling. °MAKE SURE YOU:  °· Understand these instructions. °· Will watch your condition. °· Will get help right away if you are not doing well or get worse. °Document Released: 02/14/2005 Document Revised: 07/01/2013 Document Reviewed: 07/18/2011 °ExitCare® Patient Information ©2015 [employer]. This information is not intended to   replace advice given to you by your health care provider. Make sure you discuss any questions you have with your health care provider. Urinary Tract Infection Urinary tract infections (UTIs) can develop anywhere along your urinary tract. Your urinary tract is your body's drainage system for removing wastes and  extra water. Your urinary tract includes two kidneys, two ureters, a bladder, and a urethra. Your kidneys are a pair of bean-shaped organs. Each kidney is about the size of your fist. They are located below your ribs, one on each side of your spine. CAUSES Infections are caused by microbes, which are microscopic organisms, including fungi, viruses, and bacteria. These organisms are so small that they can only be seen through a microscope. Bacteria are the microbes that most commonly cause UTIs. SYMPTOMS  Symptoms of UTIs may vary by age and gender of the patient and by the location of the infection. Symptoms in young women typically include a frequent and intense urge to urinate and a painful, burning feeling in the bladder or urethra during urination. Older women and men are more likely to be tired, shaky, and weak and have muscle aches and abdominal pain. A fever may mean the infection is in your kidneys. Other symptoms of a kidney infection include pain in your back or sides below the ribs, nausea, and vomiting. DIAGNOSIS To diagnose a UTI, your caregiver will ask you about your symptoms. Your caregiver also will ask to provide a urine sample. The urine sample will be tested for bacteria and white blood cells. White blood cells are made by your body to help fight infection. TREATMENT  Typically, UTIs can be treated with medication. Because most UTIs are caused by a bacterial infection, they usually can be treated with the use of antibiotics. The choice of antibiotic and length of treatment depend on your symptoms and the type of bacteria causing your infection. HOME CARE INSTRUCTIONS  If you were prescribed antibiotics, take them exactly as your caregiver instructs you. Finish the medication even if you feel better after you have only taken some of the medication.  Drink enough water and fluids to keep your urine clear or pale yellow.  Avoid caffeine, tea, and carbonated beverages. They tend to  irritate your bladder.  Empty your bladder often. Avoid holding urine for long periods of time.  Empty your bladder before and after sexual intercourse.  After a bowel movement, women should cleanse from front to back. Use each tissue only once. SEEK MEDICAL CARE IF:   You have back pain.  You develop a fever.  Your symptoms do not begin to resolve within 3 days. SEEK IMMEDIATE MEDICAL CARE IF:   You have severe back pain or lower abdominal pain.  You develop chills.  You have nausea or vomiting.  You have continued burning or discomfort with urination. MAKE SURE YOU:   Understand these instructions.  Will watch your condition.  Will get help right away if you are not doing well or get worse. Document Released: 11/24/2004 Document Revised: 08/16/2011 Document Reviewed: 03/25/2011 [facility] Patient Information 2015 [employer] [place]. This information is not intended to replace advice given to you by your health care provider. Make sure you discuss any questions you have with your health care provider.

## 2014-09-07 ENCOUNTER — Encounter (HOSPITAL_COMMUNITY): Payer: Self-pay

## 2014-09-07 DIAGNOSIS — J45909 Unspecified asthma, uncomplicated: Secondary | ICD-10-CM | POA: Insufficient documentation

## 2014-09-07 DIAGNOSIS — E119 Type 2 diabetes mellitus without complications: Secondary | ICD-10-CM | POA: Insufficient documentation

## 2014-09-07 DIAGNOSIS — Z79899 Other long term (current) drug therapy: Secondary | ICD-10-CM | POA: Diagnosis not present

## 2014-09-07 DIAGNOSIS — R300 Dysuria: Secondary | ICD-10-CM | POA: Diagnosis present

## 2014-09-07 DIAGNOSIS — Z720 Tobacco use: Secondary | ICD-10-CM | POA: Diagnosis not present

## 2014-09-07 DIAGNOSIS — I10 Essential (primary) hypertension: Secondary | ICD-10-CM | POA: Diagnosis not present

## 2014-09-07 DIAGNOSIS — N3000 Acute cystitis without hematuria: Secondary | ICD-10-CM | POA: Insufficient documentation

## 2014-09-07 DIAGNOSIS — Z9889 Other specified postprocedural states: Secondary | ICD-10-CM | POA: Diagnosis not present

## 2014-09-07 DIAGNOSIS — Z792 Long term (current) use of antibiotics: Secondary | ICD-10-CM | POA: Diagnosis not present

## 2014-09-07 LAB — URINALYSIS, ROUTINE W REFLEX MICROSCOPIC
Bilirubin Urine: NEGATIVE
Glucose, UA: >1000 mg/dL — AB
Ketones, ur: NEGATIVE mg/dL
Leukocytes, UA: NEGATIVE
Nitrite: NEGATIVE
PH: 5 (ref 5.0–8.0)
Protein, ur: NEGATIVE mg/dL
SPECIFIC GRAVITY, URINE: 1.039 — AB (ref 1.005–1.030)
UROBILINOGEN UA: 0.2 mg/dL (ref 0.0–1.0)

## 2014-09-07 LAB — CBC
HCT: 41.5 % (ref 36.0–46.0)
HEMOGLOBIN: 13.9 g/dL (ref 12.0–15.0)
MCH: 30.8 pg (ref 26.0–34.0)
MCHC: 33.5 g/dL (ref 30.0–36.0)
MCV: 91.8 fL (ref 78.0–100.0)
Platelets: 206 10*3/uL (ref 150–400)
RBC: 4.52 MIL/uL (ref 3.87–5.11)
RDW: 12.7 % (ref 11.5–15.5)
WBC: 6.8 10*3/uL (ref 4.0–10.5)

## 2014-09-07 LAB — COMPREHENSIVE METABOLIC PANEL
ALBUMIN: 3.8 g/dL (ref 3.5–5.0)
ALT: 16 U/L (ref 14–54)
AST: 18 U/L (ref 15–41)
Alkaline Phosphatase: 72 U/L (ref 38–126)
Anion gap: 8 (ref 5–15)
BUN: 14 mg/dL (ref 6–20)
CO2: 28 mmol/L (ref 22–32)
Calcium: 8.9 mg/dL (ref 8.9–10.3)
Chloride: 102 mmol/L (ref 101–111)
Creatinine, Ser: 1.1 mg/dL — ABNORMAL HIGH (ref 0.44–1.00)
GFR calc Af Amer: >60 mL/min (ref >60)
GFR calc non Af Amer: 57 mL/min — ABNORMAL LOW (ref >60)
Glucose, Bld: 237 mg/dL — ABNORMAL HIGH (ref 65–99)
POTASSIUM: 3.9 mmol/L (ref 3.5–5.1)
SODIUM: 138 mmol/L (ref 135–145)
Total Bilirubin: 0.5 mg/dL (ref 0.3–1.2)
Total Protein: 6.7 g/dL (ref 6.5–8.1)

## 2014-09-07 LAB — URINE MICROSCOPIC-ADD ON

## 2014-09-07 NOTE — ED Notes (Signed)
Pt here for uti, [facility] at [facility], sts today has continued pain and back pain.

## 2014-09-08 ENCOUNTER — Emergency Department (HOSPITAL_COMMUNITY)
Admission: EM | Admit: 2014-09-08 | Discharge: 2014-09-08 | Disposition: A | Discharge status: Home / Self Care | Payer: BLUE CROSS/BLUE SHIELD | Attending: Emergency Medicine | Admitting: Emergency Medicine

## 2014-09-08 DIAGNOSIS — N3000 Acute cystitis without hematuria: Secondary | ICD-10-CM

## 2014-09-08 LAB — URINE CULTURE: Culture: >=100000

## 2014-09-08 MED ORDER — CEPHALEXIN 500 MG PO CAPS: 500.0000 mg | ORAL_CAPSULE | Freq: Three times a day (TID) | ORAL | Status: DC

## 2014-09-08 MED ORDER — CEFTRIAXONE SODIUM 1 G IJ SOLR
1.0000 g | Freq: Once | INTRAMUSCULAR | Status: AC
  Administered 2014-09-08: 1 g via INTRAMUSCULAR
  Filled 2014-09-08: qty 10

## 2014-09-08 MED ORDER — OXYCODONE-ACETAMINOPHEN 5-325 MG PO TABS
1.0000 | ORAL_TABLET | Freq: Once | ORAL | Status: AC
Start: 2014-09-08 — End: 2014-09-08
  Administered 2014-09-08: 1 via ORAL
  Filled 2014-09-08: qty 1

## 2014-09-08 MED ORDER — HYDROCODONE-ACETAMINOPHEN 5-325 MG PO TABS: 1.0000 | ORAL_TABLET | Freq: Four times a day (QID) | ORAL | Status: DC | PRN

## 2014-09-08 MED ORDER — LIDOCAINE HCL (PF) 1 % IJ SOLN
INTRAMUSCULAR | Status: AC
  Administered 2014-09-08: 2.1 mL
  Filled 2014-09-08: qty 5

## 2014-09-08 NOTE — ED Notes (Signed)
Campos, MD at bedside.  

## 2014-09-08 NOTE — ED Notes (Signed)
Spoke w patient regarding report of UA culture . States she is taking her medication as written, and feels better

## 2014-09-08 NOTE — ED Provider Notes (Signed)
CSN: [id number]     Arrival date & time 09/07/14  2203 History  This chart was scribed for Azalia Bilis, MD by Octavia Heir, ED Scribe. This patient was seen in room A02C/A02C and the patient's care was started at 1:53 AM.    Chief Complaint  Patient presents with  . Dysuria  . Back Pain     Patient is a 54 y.o. female presenting with back pain. The history is provided by the patient. No language interpreter was used.  Back Pain  HPI Comments: Kelly Santana is a 54 y.o. female who presents to the Emergency Department complaining of gradual worsening right sided back pain onset a this evening. Pt was diagnosed at [facility] yesterday with UTI symptoms and says her back pain has gotten worse. She reports taking OTC tylenol to alleviate the pain with no relief. Pt denies nausea, vomiting, diarrhea, vaginal discharge, vaginal bleeding, vaginal itching, hx of kidney stones.  Past Medical History  Diagnosis Date  . Asthma   . Diabetes mellitus without complication   . Hypertension    Past Surgical History  Procedure Laterality Date  . Breast surgery    . Cesarean section     History reviewed. No pertinent family history. History  Substance Use Topics  . Smoking status: Current Every Day Smoker -- 0.50 packs/day    Types: Cigarettes  . Smokeless tobacco: Not on file  . Alcohol Use: No   OB History    No data available     Review of Systems  Musculoskeletal: Positive for back pain.    A complete 10 system review of systems was obtained and all systems are negative except as noted in the HPI and PMH.    Allergies  Sulfonamide derivatives and Ibuprofen  Home Medications   Prior to Admission medications   Medication Sig Start Date End Date Taking? Authorizing Provider  acetaminophen (TYLENOL) 650 MG CR tablet Take 1,300 mg by mouth every 8 (eight) hours as needed for pain.    Historical Provider, MD  amLODipine (NORVASC) 5 MG tablet Take 1 tablet (5 mg total) by mouth daily.  01/01/12   Reuben Likes, MD  benazepril (LOTENSIN) 40 MG tablet Take 40 mg by mouth daily.    Historical Provider, MD  Canagliflozin-Metformin HCl (INVOKAMET) 150-500 MG TABS Take 1 tablet by mouth 2 (two) times daily.    Historical Provider, MD  ciprofloxacin (CIPRO) 500 MG tablet Take 1 tablet (500 mg total) by mouth 2 (two) times daily. 09/06/14   Elvina Sidle, MD  colchicine (COLCRYS) 0.6 MG tablet Take 1 tablet (0.6 mg total) by mouth 2 (two) times daily. 01/28/14   Devoria Albe, MD  doxycycline (VIBRAMYCIN) 100 MG capsule Take 1 capsule (100 mg total) by mouth 2 (two) times daily. 01/28/14   Devoria Albe, MD  fluconazole (DIFLUCAN) 150 MG tablet Take 1 tablet (150 mg total) by mouth once. 01/28/14   Devoria Albe, MD  gabapentin (NEURONTIN) 300 MG capsule Take 300 mg by mouth at bedtime.     Historical Provider, MD  glimepiride (AMARYL) 4 MG tablet Take 4 mg by mouth daily with breakfast.    Historical Provider, MD  hydrochlorothiazide (HYDRODIURIL) 25 MG tablet Take 25 mg by mouth daily.    Historical Provider, MD  loratadine (CLARITIN) 10 MG tablet Take 10 mg by mouth daily.    Historical Provider, MD  metoprolol succinate (TOPROL-XL) 50 MG 24 hr tablet Take 50 mg by mouth daily. Take with or immediately  following a meal.    Historical Provider, MD  neomycin-polymyxin b-dexamethasone (MAXITROL) 3.5-10000-0.1 SUSP Place 1 drop into both eyes 2 (two) times daily. 09/06/14   Elvina SidleKurt Lauenstein, MD  Olopatadine HCl (PATADAY) 0.2 % SOLN Place 2 drops into both eyes 4 (four) times daily as needed (allergies/itching).    Historical Provider, MD  potassium chloride SA (K-DUR,KLOR-CON) 20 MEQ tablet Take 20 mEq by mouth daily.    Historical Provider, MD  predniSONE (DELTASONE) 10 MG tablet Take 5 tab day 1, take 4 tab day 2, take 3 tab day 3, take 2 tab day 4, and take 1 tab day 5 04/22/14   Tatyana Kirichenko, PA-C  ranitidine (ZANTAC) 300 MG tablet Take 300 mg by mouth every morning.    Historical Provider, MD   simvastatin (ZOCOR) 10 MG tablet Take 5 mg by mouth every morning.     Historical Provider, MD  traMADol (ULTRAM) 50 MG tablet Take 1 tablet (50 mg total) by mouth every 6 (six) hours as needed. 07/02/14   Jaynie Crumbleatyana Kirichenko, PA-C   Triage vitals: BP 135/89 mmHg  Pulse 77  Temp(Src) 98.7 F (37.1 C) (Oral)  Resp 18  Ht 5\' 4"  (1.626 m)  Wt 160 lb (72.576 kg)  BMI 27.45 kg/m2  SpO2 100% Physical Exam  Constitutional: She is oriented to person, place, and time. She appears well-developed and well-nourished. No distress.  HENT:  Head: Normocephalic and atraumatic.  Eyes: EOM are normal.  Neck: Normal range of motion.  Cardiovascular: Normal rate, regular rhythm and normal heart sounds.   Pulmonary/Chest: Effort normal and breath sounds normal.  Abdominal: Soft. She exhibits no distension. There is no tenderness.  Musculoskeletal: Normal range of motion.  Mild right CVA tenderness  Neurological: She is alert and oriented to person, place, and time.  Skin: Skin is warm and dry.  Psychiatric: She has a normal mood and affect. Judgment normal.  Nursing note and vitals reviewed.   ED Course  Procedures  DIAGNOSTIC STUDIES: Oxygen Saturation is 100% on RA, normal by my interpretation.  COORDINATION OF CARE:  1:56 AM Discussed treatment plan which includes antibiotics, pain medication with pt at bedside and pt agreed to plan.  Labs Review Labs Reviewed  URINALYSIS, ROUTINE W REFLEX MICROSCOPIC ([facility]) - Abnormal; Notable for the following:    APPearance CLOUDY (*)    Specific Gravity, Urine 1.039 (*)    Glucose, UA >1000 (*)    Hgb urine dipstick MODERATE (*)    All other components within normal limits  COMPREHENSIVE METABOLIC PANEL - Abnormal; Notable for the following:    Glucose, Bld 237 (*)    Creatinine, Ser 1.10 (*)    GFR calc non Af Amer 57 (*)    All other components within normal limits  URINE MICROSCOPIC-ADD ON - Abnormal; Notable for the following:     Squamous Epithelial / LPF FEW (*)    All other components within normal limits  CBC    Imaging Review No results found.   EKG Interpretation None      MDM   Final diagnoses:  Acute cystitis without hematuria    Urine culture growing out greater than 100,000 Escherichia coli.  Sensitivities are not back at this time.  Patient given Rocephin in the emergency department.  This could represent progression of her urinary tract infection to pyelonephritis.  Patient will be switched from ciprofloxacin to Keflex.  Home with pain medication and Keflex.  Intrauterine emergency department.  Nontoxic appearing.  Vitals normal.  I personally performed the services described in this documentation, which was scribed in my presence. The recorded information has been reviewed and is accurate.     Azalia Bilis, MD 09/08/14 [phone]

## 2014-09-08 NOTE — Discharge Instructions (Signed)

## 2014-11-24 ENCOUNTER — Emergency Department (HOSPITAL_COMMUNITY)
Admission: EM | Admit: 2014-11-24 | Discharge: 2014-11-24 | Disposition: A | Discharge status: Home / Self Care | Payer: BLUE CROSS/BLUE SHIELD | Attending: Emergency Medicine | Admitting: Emergency Medicine

## 2014-11-24 ENCOUNTER — Encounter (HOSPITAL_COMMUNITY): Payer: Self-pay | Admitting: Family Medicine

## 2014-11-24 ENCOUNTER — Emergency Department (HOSPITAL_COMMUNITY): Payer: BLUE CROSS/BLUE SHIELD

## 2014-11-24 DIAGNOSIS — E119 Type 2 diabetes mellitus without complications: Secondary | ICD-10-CM | POA: Insufficient documentation

## 2014-11-24 DIAGNOSIS — Z720 Tobacco use: Secondary | ICD-10-CM | POA: Insufficient documentation

## 2014-11-24 DIAGNOSIS — J45909 Unspecified asthma, uncomplicated: Secondary | ICD-10-CM | POA: Diagnosis not present

## 2014-11-24 DIAGNOSIS — I10 Essential (primary) hypertension: Secondary | ICD-10-CM | POA: Insufficient documentation

## 2014-11-24 DIAGNOSIS — M25561 Pain in right knee: Secondary | ICD-10-CM

## 2014-11-24 DIAGNOSIS — Z79899 Other long term (current) drug therapy: Secondary | ICD-10-CM | POA: Insufficient documentation

## 2014-11-24 MED ORDER — TRAMADOL HCL 50 MG PO TABS: 50.0000 mg | ORAL_TABLET | Freq: Four times a day (QID) | ORAL | Status: DC | PRN

## 2014-11-24 NOTE — ED Provider Notes (Signed)
CSN: [id number]     Arrival date & time 11/24/14  1824 History   First MD Initiated Contact with Patient 11/24/14 1858     Chief Complaint  Patient presents with  . Knee Pain     (Consider location/radiation/quality/duration/timing/severity/associated sxs/prior Treatment) Patient is a 54 y.o. female presenting with knee pain. The history is provided by the patient.  Knee Pain Location:  Knee Time since incident:  22 days Injury: no   Knee location:  R knee Pain details:    Quality:  Sharp   Radiates to:  Does not radiate   Severity:  Moderate   Onset quality:  Gradual   Progression:  Worsening Dislocation: no   Foreign body present:  No foreign bodies Prior injury to area:  No Relieved by:  Nothing Worsened by:  Bearing weight Ineffective treatments:  None tried   Kelly Santana is a 54 y.o. female who presents to the ED with right knee pain that started 2 days ago. She reports she was very busy that day and at one point it felt like her knee was giving out. She does not remember any injury. She has had a similar problem in the past but not as bad. She denies any other problems but has been told she has diabetic neuropathy.   Past Medical History  Diagnosis Date  . Asthma   . Diabetes mellitus without complication   . Hypertension    Past Surgical History  Procedure Laterality Date  . Breast surgery    . Cesarean section     History reviewed. No pertinent family history. Social History  Substance Use Topics  . Smoking status: Current Every Day Smoker -- 0.50 packs/day    Types: Cigarettes  . Smokeless tobacco: None  . Alcohol Use: No   OB History    No data available     Review of Systems Negative except as stated in HPI   Allergies  Sulfonamide derivatives and Ibuprofen  Home Medications   Prior to Admission medications   Medication Sig Start Date End Date Taking? Authorizing Provider  acetaminophen (TYLENOL) 650 MG CR tablet Take 1,300 mg by mouth  every 8 (eight) hours as needed for pain.    Historical Provider, MD  albuterol (PROVENTIL HFA;VENTOLIN HFA) 108 (90 BASE) MCG/ACT inhaler Inhale 1-2 puffs into the lungs every 6 (six) hours as needed for wheezing or shortness of breath.    Historical Provider, MD  amLODipine (NORVASC) 5 MG tablet Take 1 tablet (5 mg total) by mouth daily. 01/01/12   Reuben Likes, MD  benazepril (LOTENSIN) 40 MG tablet Take 40 mg by mouth daily.    Historical Provider, MD  Canagliflozin-Metformin HCl (INVOKAMET) 150-500 MG TABS Take 1 tablet by mouth 2 (two) times daily.    Historical Provider, MD  cephALEXin (KEFLEX) 500 MG capsule Take 1 capsule (500 mg total) by mouth 3 (three) times daily. 09/08/14   Azalia Bilis, MD  colchicine (COLCRYS) 0.6 MG tablet Take 1 tablet (0.6 mg total) by mouth 2 (two) times daily. Patient taking differently: Take 0.6 mg by mouth 2 (two) times daily as needed (gout).  01/28/14   Devoria Albe, MD  gabapentin (NEURONTIN) 300 MG capsule Take 300 mg by mouth at bedtime.     Historical Provider, MD  glimepiride (AMARYL) 4 MG tablet Take 4 mg by mouth daily with breakfast.    Historical Provider, MD  hydrochlorothiazide (HYDRODIURIL) 25 MG tablet Take 12.5 mg by mouth daily.  Historical Provider, MD  loratadine (CLARITIN) 10 MG tablet Take 10 mg by mouth daily.    Historical Provider, MD  metoprolol succinate (TOPROL-XL) 50 MG 24 hr tablet Take 50 mg by mouth daily. Take with or immediately following a meal.    Historical Provider, MD  Multiple Vitamin (MULTIVITAMIN WITH MINERALS) TABS tablet Take 1 tablet by mouth daily.    Historical Provider, MD  neomycin-polymyxin b-dexamethasone (MAXITROL) 3.5-10000-0.1 SUSP Place 1 drop into both eyes 2 (two) times daily. 09/06/14   Elvina Sidle, MD  Olopatadine HCl (PATADAY) 0.2 % SOLN Place 2 drops into both eyes 4 (four) times daily as needed (allergies/itching).    Historical Provider, MD  potassium chloride SA (K-DUR,KLOR-CON) 20 MEQ tablet Take  20 mEq by mouth daily.    Historical Provider, MD  ranitidine (ZANTAC) 300 MG tablet Take 300 mg by mouth every morning.    Historical Provider, MD  simvastatin (ZOCOR) 10 MG tablet Take 10 mg by mouth every morning.     Historical Provider, MD  traMADol (ULTRAM) 50 MG tablet Take 1 tablet (50 mg total) by mouth every 6 (six) hours as needed. 11/24/14   Hope Orlene Och, NP   BP 142/86 mmHg  Pulse 73  Temp(Src) 98.4 F (36.9 C)  Resp 18  SpO2 98% Physical Exam  Constitutional: She is oriented to person, place, and time. She appears well-developed and well-nourished. No distress.  HENT:  Head: Normocephalic and atraumatic.  Eyes: EOM are normal.  Neck: Neck supple.  Cardiovascular: Normal rate.   Pulmonary/Chest: Effort normal.  Musculoskeletal:       Right knee: She exhibits no swelling, no ecchymosis, no erythema, normal alignment and normal patellar mobility. Tenderness found. Patellar tendon tenderness noted.  Pain with flexion of the right knee. Pedal pulses 2+, good strength.   Neurological: She is alert and oriented to person, place, and time. No cranial nerve deficit.  Skin: Skin is warm and dry.  Psychiatric: She has a normal mood and affect. Her behavior is normal.  Nursing note and vitals reviewed.   ED Course  Procedures (including critical care time) Labs Review Labs Reviewed - No data to display  Imaging Review Dg Knee Complete 4 Views Right  11/24/2014   CLINICAL DATA:  Right knee pain for 2 weeks.  EXAM: RIGHT KNEE - COMPLETE 4+ VIEW  COMPARISON:  07/19/2013  FINDINGS: There is no fracture or dislocation or bone destruction. Tiny degenerative osteophytes on the patella. No joint effusion.  IMPRESSION: Minimal degenerative changes of the patella.   Electronically Signed   By: Francene Boyers M.D.   On: 11/24/2014 19:36    MDM  54 y.o. female with right knee pain x 2 days stable for d/c without focal neuro deficits. Knee immobilizer applied, crutches, ice, elevation and  pain management. She will follow up with ortho.   Final diagnoses:  Knee pain, right       [facility], NP 11/24/14 2012  Benjiman Core, MD 11/25/14 0000

## 2014-11-24 NOTE — ED Notes (Signed)
Pt here for right knee pain. sts worsening since Saturday. sts no injury that she can remember. sts when she bends down the knees gives out on her.

## 2014-11-24 NOTE — Discharge Instructions (Signed)
Apply ice, elevate, take the medication as directed and follow up with Dr. Ophelia Charter.

## 2014-11-24 NOTE — ED Notes (Signed)
Patient left at this time with all belongings. 

## 2015-01-11 ENCOUNTER — Emergency Department (HOSPITAL_COMMUNITY)
Admission: EM | Admit: 2015-01-11 | Discharge: 2015-01-11 | Disposition: A | Discharge status: Home / Self Care | Payer: BLUE CROSS/BLUE SHIELD | Attending: Physician Assistant | Admitting: Physician Assistant

## 2015-01-11 ENCOUNTER — Encounter (HOSPITAL_COMMUNITY): Payer: Self-pay

## 2015-01-11 DIAGNOSIS — J069 Acute upper respiratory infection, unspecified: Secondary | ICD-10-CM

## 2015-01-11 DIAGNOSIS — R112 Nausea with vomiting, unspecified: Secondary | ICD-10-CM | POA: Insufficient documentation

## 2015-01-11 DIAGNOSIS — Z87891 Personal history of nicotine dependence: Secondary | ICD-10-CM | POA: Diagnosis not present

## 2015-01-11 DIAGNOSIS — R52 Pain, unspecified: Secondary | ICD-10-CM

## 2015-01-11 DIAGNOSIS — I10 Essential (primary) hypertension: Secondary | ICD-10-CM | POA: Diagnosis not present

## 2015-01-11 DIAGNOSIS — Z79899 Other long term (current) drug therapy: Secondary | ICD-10-CM | POA: Diagnosis not present

## 2015-01-11 DIAGNOSIS — B349 Viral infection, unspecified: Secondary | ICD-10-CM | POA: Diagnosis not present

## 2015-01-11 DIAGNOSIS — J3489 Other specified disorders of nose and nasal sinuses: Secondary | ICD-10-CM

## 2015-01-11 DIAGNOSIS — Z792 Long term (current) use of antibiotics: Secondary | ICD-10-CM | POA: Insufficient documentation

## 2015-01-11 DIAGNOSIS — J45909 Unspecified asthma, uncomplicated: Secondary | ICD-10-CM | POA: Insufficient documentation

## 2015-01-11 DIAGNOSIS — R110 Nausea: Secondary | ICD-10-CM

## 2015-01-11 DIAGNOSIS — M791 Myalgia: Secondary | ICD-10-CM | POA: Diagnosis not present

## 2015-01-11 DIAGNOSIS — H6593 Unspecified nonsuppurative otitis media, bilateral: Secondary | ICD-10-CM | POA: Diagnosis not present

## 2015-01-11 DIAGNOSIS — E119 Type 2 diabetes mellitus without complications: Secondary | ICD-10-CM | POA: Insufficient documentation

## 2015-01-11 DIAGNOSIS — R0981 Nasal congestion: Secondary | ICD-10-CM

## 2015-01-11 DIAGNOSIS — J029 Acute pharyngitis, unspecified: Secondary | ICD-10-CM

## 2015-01-11 LAB — RAPID STREP SCREEN (MED CTR MEBANE ONLY): STREPTOCOCCUS, GROUP A SCREEN (DIRECT): NEGATIVE

## 2015-01-11 MED ORDER — FLUTICASONE PROPIONATE 50 MCG/ACT NA SUSP: 2.0000 | Freq: Every day | NASAL | Status: DC

## 2015-01-11 MED ORDER — ONDANSETRON 4 MG PO TBDP
8.0000 mg | ORAL_TABLET | Freq: Once | ORAL | Status: AC
  Administered 2015-01-11: 8 mg via ORAL
  Filled 2015-01-11: qty 2

## 2015-01-11 MED ORDER — ONDANSETRON 4 MG PO TBDP: 4.0000 mg | ORAL_TABLET | Freq: Three times a day (TID) | ORAL | Status: DC | PRN

## 2015-01-11 NOTE — ED Notes (Signed)
Pt given Ginger Ale as fluid challenge.

## 2015-01-11 NOTE — ED Provider Notes (Signed)
CSN: [id number]     Arrival date & time 01/11/15  1157 History  By signing my name below, I, Kelly Santana, attest that this documentation has been prepared under the direction and in the presence of [name], PA-C. Electronically Signed: [name] ED Scribe. 01/11/2015. 12:52 PM.    Chief Complaint  Patient presents with  . URI  . Sore Throat   Patient is a 54 y.o. female presenting with URI and pharyngitis. The history is provided by the patient. No language interpreter was used.  URI Presenting symptoms: congestion, cough, ear pain, rhinorrhea and sore throat   Presenting symptoms: no fever   Congestion:    Location:  Nasal   Interferes with sleep: no     Interferes with eating/drinking: no   Cough:    Cough characteristics:  Dry   Severity:  Mild   Onset quality:  Gradual   Duration:  2 days   Timing:  Intermittent   Progression:  Unchanged   Chronicity:  New Severity:  Mild Onset quality:  Gradual Duration:  2 days Timing:  Constant Progression:  Unchanged Chronicity:  New Relieved by:  OTC medications (Nyquill (causes sleep)) Worsened by:  Nothing tried Ineffective treatments:  OTC medications (Tylenol) Associated symptoms: myalgias and sinus pain   Associated symptoms: no wheezing   Risk factors: diabetes mellitus and sick contacts   Sore Throat This is a new problem. The current episode started 2 days ago. The problem occurs constantly. The problem has not changed since onset.Pertinent negatives include no chest pain, no abdominal pain and no shortness of breath. The symptoms are aggravated by swallowing. Nothing relieves the symptoms. Treatments tried: Tylenol, Nyquill. The treatment provided no relief.   HPI Comments: Kelly Santana is a 54 y.o. female with a PMHx of seasonal allergies (takes daily antihistamine), asthma, DM2, and HTN, who presents to the Emergency Department complaining of URI symptoms which began as a sore throat and now include  b/l ear pressure, dry cough, clear rhinorrhea, sinus congestion, chills, nausea, vomiting (3 episodes of NBNB emesis 2 days, resolved yesterday), and body aches; treated with Tylenol and Nyquil, which provided some relief but not significant relief; no known aggravating factors.  The patient reports +sick contacts with grandson and two coworkers at [employer].  She denies known fever, CP, SOB, wheezing, abd pain, ongoing vomiting, hematemesis, diarrhea, constipation, dysuria, hematuria, trismus, drooling, ear drainage, eye redness/itching, eye pain, numbness, tingling, or weakness.  Patient reports an allergy to Sulfa and NSAIDs.    Past Medical History  Diagnosis Date  . Asthma   . Diabetes mellitus without complication (HCC)   . Hypertension    Past Surgical History  Procedure Laterality Date  . Breast surgery    . Cesarean section     History reviewed. No pertinent family history. Social History  Substance Use Topics  . Smoking status: Former Smoker -- 0.00 packs/day    Quit date: 11/11/2014  . Smokeless tobacco: None  . Alcohol Use: Yes     Comment: occ    OB History    No data available     Review of Systems  Constitutional: Positive for chills. Negative for fever.  HENT: Positive for congestion, ear pain, rhinorrhea, sinus pressure and sore throat. Negative for drooling, ear discharge and trouble swallowing.   Respiratory: Positive for cough. Negative for shortness of breath and wheezing.   Cardiovascular: Negative for chest pain.  Gastrointestinal: Positive for nausea and vomiting (Friday, none today). Negative  for abdominal pain, diarrhea and constipation.  Genitourinary: Negative for dysuria and hematuria.  Musculoskeletal: Positive for myalgias.  Skin: Negative for color change.  Allergic/Immunologic: Positive for environmental allergies and immunocompromised state (diabetes).  Neurological: Negative for weakness and numbness.   10 Systems reviewed and all are  negative for acute change except as noted in the HPI.   Allergies  Sulfonamide derivatives and Ibuprofen  Home Medications   Prior to Admission medications   Medication Sig Start Date End Date Taking? Authorizing Provider  acetaminophen (TYLENOL) 650 MG CR tablet Take 1,300 mg by mouth every 8 (eight) hours as needed for pain.    Historical Provider, MD  albuterol (PROVENTIL HFA;VENTOLIN HFA) 108 (90 BASE) MCG/ACT inhaler Inhale 1-2 puffs into the lungs every 6 (six) hours as needed for wheezing or shortness of breath.    Historical Provider, MD  amLODipine (NORVASC) 5 MG tablet Take 1 tablet (5 mg total) by mouth daily. 01/01/12   Kelly Likes, MD  benazepril (LOTENSIN) 40 MG tablet Take 40 mg by mouth daily.    Historical Provider, MD  Canagliflozin-Metformin HCl (INVOKAMET) 150-500 MG TABS Take 1 tablet by mouth 2 (two) times daily.    Historical Provider, MD  cephALEXin (KEFLEX) 500 MG capsule Take 1 capsule (500 mg total) by mouth 3 (three) times daily. 09/08/14   Kelly Bilis, MD  colchicine (COLCRYS) 0.6 MG tablet Take 1 tablet (0.6 mg total) by mouth 2 (two) times daily. Patient taking differently: Take 0.6 mg by mouth 2 (two) times daily as needed (gout).  01/28/14   Kelly Albe, MD  gabapentin (NEURONTIN) 300 MG capsule Take 300 mg by mouth at bedtime.     Historical Provider, MD  glimepiride (AMARYL) 4 MG tablet Take 4 mg by mouth daily with breakfast.    Historical Provider, MD  hydrochlorothiazide (HYDRODIURIL) 25 MG tablet Take 12.5 mg by mouth daily.     Historical Provider, MD  loratadine (CLARITIN) 10 MG tablet Take 10 mg by mouth daily.    Historical Provider, MD  metoprolol succinate (TOPROL-XL) 50 MG 24 hr tablet Take 50 mg by mouth daily. Take with or immediately following a meal.    Historical Provider, MD  Multiple Vitamin (MULTIVITAMIN WITH MINERALS) TABS tablet Take 1 tablet by mouth daily.    Historical Provider, MD  neomycin-polymyxin b-dexamethasone (MAXITROL)  3.5-10000-0.1 SUSP Place 1 drop into both eyes 2 (two) times daily. 09/06/14   Kelly Sidle, MD  Olopatadine HCl (PATADAY) 0.2 % SOLN Place 2 drops into both eyes 4 (four) times daily as needed (allergies/itching).    Historical Provider, MD  potassium chloride SA (K-DUR,KLOR-CON) 20 MEQ tablet Take 20 mEq by mouth daily.    Historical Provider, MD  ranitidine (ZANTAC) 300 MG tablet Take 300 mg by mouth every morning.    Historical Provider, MD  simvastatin (ZOCOR) 10 MG tablet Take 10 mg by mouth every morning.     Historical Provider, MD  traMADol (ULTRAM) 50 MG tablet Take 1 tablet (50 mg total) by mouth every 6 (six) hours as needed. 11/24/14   Hope Orlene Och, NP   BP 132/93 mmHg  Pulse 88  Temp(Src) 98.9 F (37.2 C) (Oral)  Resp 18  SpO2 98% Physical Exam  Constitutional: She is oriented to person, place, and time. Vital signs are normal. She appears well-developed and well-nourished.  Non-toxic appearance. No distress.  Afebrile, nontoxic, NAD  HENT:  Head: Normocephalic and atraumatic.  Right Ear: Hearing, tympanic membrane, external ear  and ear canal normal. Tympanic membrane is not injected. Right ear middle ear effusion: serous.  Left Ear: Hearing, tympanic membrane, external ear and ear canal normal. Tympanic membrane is not injected. Left ear middle ear effusion: serous.  Nose: Mucosal edema and rhinorrhea present.  Mouth/Throat: Uvula is midline and mucous membranes are normal. No trismus in the jaw. No uvula swelling. Oropharyngeal exudate, posterior oropharyngeal edema and posterior oropharyngeal erythema present. No tonsillar abscesses.  Ears with serous effusion bilaterally, no TM bulging or erythema. Nose with mucosal edema and rhinorrhea. Oropharynx injected without uvular swelling or deviation, no trismus or drooling, 1+ tonsillar swelling and erythema, +exudates.  No PTA.   Eyes: Conjunctivae and EOM are normal. Right eye exhibits no discharge. Left eye exhibits no  discharge.  Neck: Normal range of motion. Neck supple.  Cardiovascular: Normal rate, regular rhythm, normal heart sounds and intact distal pulses.  Exam reveals no gallop and no friction rub.   No murmur heard. Pulmonary/Chest: Effort normal and breath sounds normal. No respiratory distress. She has no decreased breath sounds. She has no wheezes. She has no rhonchi. She has no rales.  CTAB in all lung fields, no w/r/r, no hypoxia or increased WOB, speaking in full sentences, SpO2 98% on RA  Abdominal: Soft. Normal appearance and bowel sounds are normal. She exhibits no distension. There is no tenderness. There is no rigidity, no rebound, no guarding and no CVA tenderness.  Musculoskeletal: Normal range of motion.  Lymphadenopathy:       Head (right side): Tonsillar adenopathy present.       Head (left side): Tonsillar adenopathy present.    She has cervical adenopathy.  B/l tonsillar LAD with mild TTP, and shotty cervical LAD.  Neurological: She is alert and oriented to person, place, and time. She has normal strength. No sensory deficit.  Skin: Skin is warm, dry and intact. No rash noted.  Psychiatric: She has a normal mood and affect. Her behavior is normal.  Nursing note and vitals reviewed.   ED Course  Procedures (including critical care time)  DIAGNOSTIC STUDIES: Oxygen Saturation is 98% on RA, normal by my interpretation.    COORDINATION OF CARE:  1:00 PM Will order rapid strep and anti-emetic. Patient acknowledges and agrees with plan.    Labs Review Labs Reviewed  RAPID STREP SCREEN (NOT AT [facility])  CULTURE, GROUP A STREP    Imaging Review No results found. I have personally reviewed and evaluated these images and lab results as part of my medical decision-making.   EKG Interpretation None      MDM   Final diagnoses:  URI (upper respiratory infection)  Sinus congestion  Rhinorrhea  Pharyngitis  Body aches  Viral syndrome  Bilateral serous otitis media,  recurrence not specified, unspecified chronicity  Nausea    54 y.o. female here with URI symptoms and sore throat. Pt is afebrile with a clear lung exam. Mild rhinorrhea. Tonsillar exudates with no significant cough and +LAD, unknown fever given that she's been medicated, therefore CENTOR criteria moderate but possibly high, will test for strep. Patient took tylenol already and can't take NSAIDs therefore can't give anything for pain at this time. Will give zofran for nausea. will reassess shortly.   1:56 PM RST neg. Likely viral URI. Pt is agreeable to symptomatic treatment with close follow up with PCP as needed but spoke at length about emergent changing or worsening of symptoms that should prompt return to ER. Will rx zofran, pt tolerating PO well  here. Will also give flonase to help with nasal congestion. Pt voices understanding and is agreeable to plan. Stable at time of discharge.   I personally performed the services described in this documentation, which was scribed in my presence. The recorded information has been reviewed and is accurate.    BP 132/93 mmHg  Pulse 88  Temp(Src) 98.9 F (37.2 C) (Oral)  Resp 18  SpO2 98%  Meds ordered this encounter  Medications  . ondansetron (ZOFRAN-ODT) disintegrating tablet 8 mg    Sig:   . ondansetron (ZOFRAN ODT) 4 MG disintegrating tablet    Sig: Take 1 tablet (4 mg total) by mouth every 8 (eight) hours as needed for nausea or vomiting.    Dispense:  15 tablet    Refill:  0    Order Specific Question:  Supervising Provider    Answer:  MILLER, BRIAN [[id number]]  . fluticasone (FLONASE) 50 MCG/ACT nasal spray    Sig: Place 2 sprays into both nostrils daily.    Dispense:  16 g    Refill:  0    Order Specific Question:  Supervising Provider    Answer:  Eber HongMILLER, BRIAN [[id number]]     Kenlee Maler Camprubi-Soms, PA-C 01/11/15 1359  Courteney Randall AnLyn Mackuen, MD 01/11/15 1637

## 2015-01-11 NOTE — ED Notes (Signed)
Pt reports onset 2 days ago runny nose-clear drainage, nasal congestion, body aches, chills, vomited x 3 yesterday, nausea and mild cough.  Pt has been taking Nyquil and Dayquil.  Grandson lives in household and viral symptoms now.   Daughter has sore throat and body aches.  No swallowing and respiratory difficulties.

## 2015-01-11 NOTE — ED Notes (Signed)
Pt requesting Tylenol. PA informed.

## 2015-01-11 NOTE — Discharge Instructions (Signed)
Continue to stay well-hydrated. Use zofran as needed for nausea. Gargle warm salt water and spit it out. Use chloraseptic spray as needed for sore throat. Continue to use Tylenol for pain or fever. Use Mucinex for cough suppression/expectoration of mucus. Use netipot and flonase to help with nasal congestion. May consider over-the-counter Benadryl or other antihistamine to decrease secretions and for watery itchy eyes. Followup with your primary care doctor in 5-7 days for recheck of ongoing symptoms. Return to emergency department for emergent changing or worsening of symptoms.   Viral Infections A virus is a type of germ. Viruses can cause:  Minor sore throats.  Aches and pains.  Headaches.  Runny nose.  Rashes.  Watery eyes.  Tiredness.  Coughs.  Loss of appetite.  Feeling sick to your stomach (nausea).  Throwing up (vomiting).  Watery poop (diarrhea). HOME CARE   Only take medicines as told by your doctor.  Drink enough water and fluids to keep your pee (urine) clear or pale yellow. Sports drinks are a good choice.  Get plenty of rest and eat healthy. Soups and broths with crackers or rice are fine. GET HELP RIGHT AWAY IF:   You have a very bad headache.  You have shortness of breath.  You have chest pain or neck pain.  You have an unusual rash.  You cannot stop throwing up.  You have watery poop that does not stop.  You cannot keep fluids down.  You or your child has a temperature by mouth above 102 F (38.9 C), not controlled by medicine.  Your baby is older than 3 months with a rectal temperature of 102 F (38.9 C) or higher.  Your baby is 61 months old or younger with a rectal temperature of 100.4 F (38 C) or higher. MAKE SURE YOU:   Understand these instructions.  Will watch this condition.  Will get help right away if you are not doing well or get worse.   This information is not intended to replace advice given to you by your health care  provider. Make sure you discuss any questions you have with your health care provider.   Document Released: 01/28/2008 Document Revised: 05/09/2011 Document Reviewed: 07/23/2014 Elsevier Interactive Patient Education 2016 [employer]  Sore Throat A sore throat is a painful, burning, sore, or scratchy feeling of the throat. There may be pain or tenderness when swallowing or talking. You may have other symptoms with a sore throat. These include coughing, sneezing, fever, or a swollen neck. A sore throat is often the first sign of another sickness. These sicknesses may include a cold, flu, strep throat, or an infection called mono. Most sore throats go away without medical treatment.  HOME CARE   Only take medicine as told by your doctor.  Drink enough fluids to keep your pee (urine) clear or pale yellow.  Rest as needed.  Try using throat sprays, lozenges, or suck on hard candy (if older than 4 years or as told).  Sip warm liquids, such as broth, herbal tea, or warm water with honey. Try sucking on frozen ice pops or drinking cold liquids.  Rinse the mouth (gargle) with salt water. Mix 1 teaspoon salt with 8 ounces of water.  Do not smoke. Avoid being around others when they are smoking.  Put a humidifier in your bedroom at night to moisten the air. You can also turn on a hot shower and sit in the bathroom for 5-10 minutes. Be sure the bathroom door is  closed. GET HELP RIGHT AWAY IF:   You have trouble breathing.  You cannot swallow fluids, soft foods, or your spit (saliva).  You have more puffiness (swelling) in the throat.  Your sore throat does not get better in 7 days.  You feel sick to your stomach (nauseous) and throw up (vomit).  You have a fever or lasting symptoms for more than 2-3 days.  You have a fever and your symptoms suddenly get worse. MAKE SURE YOU:   Understand these instructions.  Will watch your condition.  Will get help right away if you are not  doing well or get worse.   This information is not intended to replace advice given to you by your health care provider. Make sure you discuss any questions you have with your health care provider.   Document Released: 11/24/2007 Document Revised: 11/09/2011 Document Reviewed: 10/23/2011 Elsevier Interactive Patient Education 2016 Else[employer] Inc.  Serous Otitis Media Serous otitis media is fluid in the middle ear space. This space contains the bones for hearing and air. Air in the middle ear space helps to transmit sound.  The air gets there through the eustachian tube. This tube goes from the back of the nose (nasopharynx) to the middle ear space. It keeps the pressure in the middle ear the same as the outside world. It also helps to drain fluid from the middle ear space. CAUSES  Serous otitis media occurs when the eustachian tube gets blocked. Blockage can come from:  Ear infections.  Colds and other upper respiratory infections.  Allergies.  Irritants such as cigarette smoke.  Sudden changes in air pressure (such as descending in an airplane).  Enlarged adenoids.  A mass in the nasopharynx. During colds and upper respiratory infections, the middle ear space can become temporarily filled with fluid. This can happen after an ear infection also. Once the infection clears, the fluid will generally drain out of the ear through the eustachian tube. If it does not, then serous otitis media occurs. SIGNS AND SYMPTOMS   Hearing loss.  A feeling of fullness in the ear, without pain.  Young children may not show any symptoms but may show slight behavioral changes, such as agitation, ear pulling, or crying. DIAGNOSIS  Serous otitis media is diagnosed by an ear exam. Tests may be done to check on the movement of the eardrum. Hearing exams may also be done. TREATMENT  The fluid most often goes away without treatment. If allergy is the cause, allergy treatment may be helpful. Fluid that  persists for several months may require minor surgery. A small tube is placed in the eardrum to:  Drain the fluid.  Restore the air in the middle ear space. In certain situations, antibiotic medicines are used to avoid surgery. Surgery may be done to remove enlarged adenoids (if this is the cause). HOME CARE INSTRUCTIONS   Keep children away from tobacco smoke.  Keep all follow-up visits as directed by your health care provider. SEEK MEDICAL CARE IF:   Your hearing is not better in 3 months.  Your hearing is worse.  You have ear pain.  You have drainage from the ear.  You have dizziness.  You have serous otitis media only in one ear or have any bleeding from your nose (epistaxis).  You notice a lump on your neck. MAKE SURE YOU:  Understand these instructions.   Will watch your condition.   Will get help right away if you are not doing well or get worse.  This information is not intended to replace advice given to you by your health care provider. Make sure you discuss any questions you have with your health care provider.   Document Released: 05/07/2003 Document Revised: 03/07/2014 Document Reviewed: 09/11/2012 Elsevier Interactive Patient Education 2016 Else[employer] Inc.  Nausea, Adult Nausea is the feeling that you have an upset stomach or have to vomit. Nausea by itself is not likely a serious concern, but it may be an early sign of more serious medical problems. As nausea gets worse, it can lead to vomiting. If vomiting develops, there is the risk of dehydration.  CAUSES   Viral infections.  Food poisoning.  Medicines.  Pregnancy.  Motion sickness.  Migraine headaches.  Emotional distress.  Severe pain from any source.  Alcohol intoxication. HOME CARE INSTRUCTIONS  Get plenty of rest.  Ask your caregiver about specific rehydration instructions.  Eat small amounts of food and sip liquids more often.  Take all medicines as told by your  caregiver. SEEK MEDICAL CARE IF:  You have not improved after 2 days, or you get worse.  You have a headache. SEEK IMMEDIATE MEDICAL CARE IF:   You have a fever.  You faint.  You keep vomiting or have blood in your vomit.  You are extremely weak or dehydrated.  You have dark or bloody stools.  You have severe chest or abdominal pain. MAKE SURE YOU:  Understand these instructions.  Will watch your condition.  Will get help right away if you are not doing well or get worse.   This information is not intended to replace advice given to you by your health care provider. Make sure you discuss any questions you have with your health care provider.   Document Released: 03/24/2004 Document Revised: 03/07/2014 Document Reviewed: 10/27/2010 Elsevier Interactive Patient Education [employer]2016 [employer] Inc.

## 2015-01-14 ENCOUNTER — Other Ambulatory Visit: Payer: Self-pay | Admitting: Orthopaedic Surgery

## 2015-01-14 DIAGNOSIS — M25561 Pain in right knee: Secondary | ICD-10-CM

## 2015-01-14 LAB — CULTURE, GROUP A STREP: STREP A CULTURE: NEGATIVE

## 2015-01-15 ENCOUNTER — Encounter (HOSPITAL_COMMUNITY): Payer: Self-pay | Admitting: *Deleted

## 2015-01-15 ENCOUNTER — Emergency Department (INDEPENDENT_AMBULATORY_CARE_PROVIDER_SITE_OTHER)
Admission: EM | Admit: 2015-01-15 | Discharge: 2015-01-15 | Disposition: A | Discharge status: Home / Self Care | Payer: BLUE CROSS/BLUE SHIELD | Source: Home / Self Care | Attending: Family Medicine | Admitting: Family Medicine

## 2015-01-15 DIAGNOSIS — J069 Acute upper respiratory infection, unspecified: Secondary | ICD-10-CM | POA: Diagnosis not present

## 2015-01-15 DIAGNOSIS — H109 Unspecified conjunctivitis: Secondary | ICD-10-CM | POA: Diagnosis not present

## 2015-01-15 MED ORDER — IPRATROPIUM BROMIDE 0.06 % NA SOLN: 2.0000 | Freq: Four times a day (QID) | NASAL | Status: DC

## 2015-01-15 MED ORDER — AZITHROMYCIN 250 MG PO TABS: ORAL_TABLET | ORAL | Status: DC

## 2015-01-15 MED ORDER — TOBRAMYCIN 0.3 % OP SOLN: 1.0000 [drp] | Freq: Four times a day (QID) | OPHTHALMIC | Status: DC

## 2015-01-15 NOTE — ED Provider Notes (Signed)
CSN: [id number]     Arrival date & time 01/15/15  1816 History   First MD Initiated Contact with Patient 01/15/15 1859     Chief Complaint  Patient presents with  . Eye Problem   (Consider location/radiation/quality/duration/timing/severity/associated sxs/prior Treatment) Patient is a 54 y.o. female presenting with URI. The history is provided by the patient.  URI Presenting symptoms: congestion, cough, rhinorrhea and sore throat   Presenting symptoms: no fever   Severity:  Mild Onset quality:  Gradual Duration:  4 days Progression:  Unchanged Chronicity:  New Relieved by:  None tried Worsened by:  Nothing tried Ineffective treatments:  None tried Associated symptoms: no wheezing     Past Medical History  Diagnosis Date  . Asthma   . Diabetes mellitus without complication (HCC)   . Hypertension    Past Surgical History  Procedure Laterality Date  . Breast surgery    . Cesarean section     History reviewed. No pertinent family history. Social History  Substance Use Topics  . Smoking status: Former Smoker -- 0.00 packs/day    Quit date: 11/11/2014  . Smokeless tobacco: None  . Alcohol Use: Yes     Comment: occ    OB History    No data available     Review of Systems  Constitutional: Negative.  Negative for fever.  HENT: Positive for congestion, postnasal drip, rhinorrhea and sore throat.   Eyes: Positive for discharge and redness.  Respiratory: Positive for cough. Negative for shortness of breath and wheezing.   Cardiovascular: Negative.     Allergies  Sulfonamide derivatives and Ibuprofen  Home Medications   Prior to Admission medications   Medication Sig Start Date End Date Taking? Authorizing Provider  acetaminophen (TYLENOL) 650 MG CR tablet Take 1,300 mg by mouth every 8 (eight) hours as needed for pain.    Historical Provider, MD  albuterol (PROVENTIL HFA;VENTOLIN HFA) 108 (90 BASE) MCG/ACT inhaler Inhale 1-2 puffs into the lungs every 6 (six) hours  as needed for wheezing or shortness of breath.    Historical Provider, MD  amLODipine (NORVASC) 5 MG tablet Take 1 tablet (5 mg total) by mouth daily. 01/01/12   Reuben Likesavid C Keller, MD  azithromycin (ZITHROMAX Z-PAK) 250 MG tablet Take as directed on pack 01/15/15   Linna HoffJames D Kindl, MD  benazepril (LOTENSIN) 40 MG tablet Take 40 mg by mouth daily.    Historical Provider, MD  Canagliflozin-Metformin HCl (INVOKAMET) 150-500 MG TABS Take 1 tablet by mouth 2 (two) times daily.    Historical Provider, MD  cephALEXin (KEFLEX) 500 MG capsule Take 1 capsule (500 mg total) by mouth 3 (three) times daily. 09/08/14   Azalia BilisKevin Campos, MD  colchicine (COLCRYS) 0.6 MG tablet Take 1 tablet (0.6 mg total) by mouth 2 (two) times daily. Patient taking differently: Take 0.6 mg by mouth 2 (two) times daily as needed (gout).  01/28/14   Devoria AlbeIva Knapp, MD  fluticasone (FLONASE) 50 MCG/ACT nasal spray Place 2 sprays into both nostrils daily. 01/11/15   Mercedes Camprubi-Soms, PA-C  gabapentin (NEURONTIN) 300 MG capsule Take 300 mg by mouth at bedtime.     Historical Provider, MD  glimepiride (AMARYL) 4 MG tablet Take 4 mg by mouth daily with breakfast.    Historical Provider, MD  hydrochlorothiazide (HYDRODIURIL) 25 MG tablet Take 12.5 mg by mouth daily.     Historical Provider, MD  ipratropium (ATROVENT) 0.06 % nasal spray Place 2 sprays into both nostrils 4 (four) times daily. 01/15/15  Linna Hoff, MD  loratadine (CLARITIN) 10 MG tablet Take 10 mg by mouth daily.    Historical Provider, MD  metoprolol succinate (TOPROL-XL) 50 MG 24 hr tablet Take 50 mg by mouth daily. Take with or immediately following a meal.    Historical Provider, MD  Multiple Vitamin (MULTIVITAMIN WITH MINERALS) TABS tablet Take 1 tablet by mouth daily.    Historical Provider, MD  neomycin-polymyxin b-dexamethasone (MAXITROL) 3.5-10000-0.1 SUSP Place 1 drop into both eyes 2 (two) times daily. 09/06/14   Elvina Sidle, MD  Olopatadine HCl (PATADAY) 0.2 % SOLN  Place 2 drops into both eyes 4 (four) times daily as needed (allergies/itching).    Historical Provider, MD  ondansetron (ZOFRAN ODT) 4 MG disintegrating tablet Take 1 tablet (4 mg total) by mouth every 8 (eight) hours as needed for nausea or vomiting. 01/11/15   Mercedes Camprubi-Soms, PA-C  potassium chloride SA (K-DUR,KLOR-CON) 20 MEQ tablet Take 20 mEq by mouth daily.    Historical Provider, MD  ranitidine (ZANTAC) 300 MG tablet Take 300 mg by mouth every morning.    Historical Provider, MD  simvastatin (ZOCOR) 10 MG tablet Take 10 mg by mouth every morning.     Historical Provider, MD  tobramycin (TOBREX) 0.3 % ophthalmic solution Place 1 drop into both eyes every 6 (six) hours. 01/15/15   Linna Hoff, MD  traMADol (ULTRAM) 50 MG tablet Take 1 tablet (50 mg total) by mouth every 6 (six) hours as needed. 11/24/14   Hope Orlene Och, NP   Meds Ordered and Administered this Visit  Medications - No data to display  BP 141/94 mmHg  Pulse 84  Temp(Src) 98.8 F (37.1 C) (Oral)  Resp 16  SpO2 97% No data found.   Physical Exam  Constitutional: She is oriented to person, place, and time. She appears well-developed and well-nourished. No distress.  HENT:  Right Ear: External ear normal.  Left Ear: External ear normal.  Mouth/Throat: Uvula is midline and mucous membranes are normal. Posterior oropharyngeal erythema present. No oropharyngeal exudate or posterior oropharyngeal edema.  Eyes: EOM and lids are normal. Pupils are equal, round, and reactive to light. Right conjunctiva is injected. Left conjunctiva is injected.  Neck: Normal range of motion. Neck supple.  Cardiovascular: Normal heart sounds.   Pulmonary/Chest: Breath sounds normal.  Lymphadenopathy:    She has no cervical adenopathy.  Neurological: She is alert and oriented to person, place, and time.  Skin: Skin is warm and dry.  Nursing note and vitals reviewed.   ED Course  Procedures (including critical care time)  Labs  Review Labs Reviewed - No data to display  Imaging Review No results found.   Visual Acuity Review  Right Eye Distance:   Left Eye Distance:   Bilateral Distance:    Right Eye Near:   Left Eye Near:    Bilateral Near:         MDM   1. URI (upper respiratory infection)   2. Bilateral conjunctivitis        Linna Hoff, MD 01/15/15 [phone]

## 2015-01-15 NOTE — ED Notes (Signed)
Pt  Reports  Symptoms  Of both  Eyes  Red     -  sorethroat    As    Well   As   Congested         Symptoms  X  6  Days

## 2015-01-24 ENCOUNTER — Inpatient Hospital Stay: Admission: RE | Admit: 2015-01-24 | Payer: BLUE CROSS/BLUE SHIELD | Source: Ambulatory Visit

## 2015-01-24 ENCOUNTER — Other Ambulatory Visit: Payer: BLUE CROSS/BLUE SHIELD

## 2015-02-01 ENCOUNTER — Other Ambulatory Visit: Payer: BLUE CROSS/BLUE SHIELD

## 2015-03-23 ENCOUNTER — Encounter (HOSPITAL_COMMUNITY): Payer: Self-pay | Admitting: *Deleted

## 2015-03-23 ENCOUNTER — Emergency Department (HOSPITAL_COMMUNITY)
Admission: EM | Admit: 2015-03-23 | Discharge: 2015-03-23 | Disposition: A | Discharge status: Home / Self Care | Payer: BLUE CROSS/BLUE SHIELD | Attending: Emergency Medicine | Admitting: Emergency Medicine

## 2015-03-23 ENCOUNTER — Emergency Department (HOSPITAL_COMMUNITY): Payer: BLUE CROSS/BLUE SHIELD

## 2015-03-23 DIAGNOSIS — Y92410 Unspecified street and highway as the place of occurrence of the external cause: Secondary | ICD-10-CM | POA: Diagnosis not present

## 2015-03-23 DIAGNOSIS — Z792 Long term (current) use of antibiotics: Secondary | ICD-10-CM | POA: Diagnosis not present

## 2015-03-23 DIAGNOSIS — E119 Type 2 diabetes mellitus without complications: Secondary | ICD-10-CM | POA: Diagnosis not present

## 2015-03-23 DIAGNOSIS — Z23 Encounter for immunization: Secondary | ICD-10-CM | POA: Diagnosis not present

## 2015-03-23 DIAGNOSIS — J45909 Unspecified asthma, uncomplicated: Secondary | ICD-10-CM | POA: Insufficient documentation

## 2015-03-23 DIAGNOSIS — Z7984 Long term (current) use of oral hypoglycemic drugs: Secondary | ICD-10-CM | POA: Diagnosis not present

## 2015-03-23 DIAGNOSIS — Z79899 Other long term (current) drug therapy: Secondary | ICD-10-CM | POA: Insufficient documentation

## 2015-03-23 DIAGNOSIS — S29001A Unspecified injury of muscle and tendon of front wall of thorax, initial encounter: Secondary | ICD-10-CM | POA: Diagnosis not present

## 2015-03-23 DIAGNOSIS — I10 Essential (primary) hypertension: Secondary | ICD-10-CM | POA: Diagnosis not present

## 2015-03-23 DIAGNOSIS — Y9389 Activity, other specified: Secondary | ICD-10-CM | POA: Diagnosis not present

## 2015-03-23 DIAGNOSIS — Y998 Other external cause status: Secondary | ICD-10-CM | POA: Insufficient documentation

## 2015-03-23 DIAGNOSIS — S199XXA Unspecified injury of neck, initial encounter: Secondary | ICD-10-CM | POA: Diagnosis not present

## 2015-03-23 DIAGNOSIS — S0101XA Laceration without foreign body of scalp, initial encounter: Secondary | ICD-10-CM | POA: Diagnosis present

## 2015-03-23 DIAGNOSIS — Z87891 Personal history of nicotine dependence: Secondary | ICD-10-CM | POA: Diagnosis not present

## 2015-03-23 DIAGNOSIS — S0083XA Contusion of other part of head, initial encounter: Secondary | ICD-10-CM

## 2015-03-23 MED ORDER — TETANUS-DIPHTH-ACELL PERTUSSIS 5-2.5-18.5 LF-MCG/0.5 IM SUSP
0.5000 mL | Freq: Once | INTRAMUSCULAR | Status: AC
  Administered 2015-03-23: 0.5 mL via INTRAMUSCULAR
  Filled 2015-03-23: qty 0.5

## 2015-03-23 MED ORDER — CYCLOBENZAPRINE HCL 10 MG PO TABS: 10.0000 mg | ORAL_TABLET | Freq: Three times a day (TID) | ORAL | Status: DC | PRN

## 2015-03-23 MED ORDER — HYDROCODONE-ACETAMINOPHEN 5-325 MG PO TABS
2.0000 | ORAL_TABLET | Freq: Once | ORAL | Status: AC
  Administered 2015-03-23: 2 via ORAL
  Filled 2015-03-23: qty 2

## 2015-03-23 MED ORDER — HYDROCODONE-ACETAMINOPHEN 5-325 MG PO TABS: 1.0000 | ORAL_TABLET | ORAL | Status: DC | PRN

## 2015-03-23 MED ORDER — LIDOCAINE-EPINEPHRINE (PF) 2 %-1:200000 IJ SOLN
10.0000 mL | Freq: Once | INTRAMUSCULAR | Status: AC
  Administered 2015-03-23: 10 mL
  Filled 2015-03-23: qty 10

## 2015-03-23 NOTE — ED Notes (Signed)
Pt verbalized understanding of d/c instructions, prescriptions, and follow-up care. No further questions/concerns, VSS, ambulatory w/ steady gait (refused wheelchair) 

## 2015-03-23 NOTE — ED Notes (Signed)
Patient transported to X-ray 
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
## 2015-03-25 ENCOUNTER — Encounter (HOSPITAL_COMMUNITY): Payer: Self-pay | Admitting: Neurology

## 2015-03-25 ENCOUNTER — Emergency Department (HOSPITAL_COMMUNITY)
Admission: EM | Admit: 2015-03-25 | Discharge: 2015-03-25 | Disposition: A | Discharge status: Home / Self Care | Payer: BLUE CROSS/BLUE SHIELD | Attending: Emergency Medicine | Admitting: Emergency Medicine

## 2015-03-25 DIAGNOSIS — I10 Essential (primary) hypertension: Secondary | ICD-10-CM | POA: Diagnosis not present

## 2015-03-25 DIAGNOSIS — T8089XA Other complications following infusion, transfusion and therapeutic injection, initial encounter: Secondary | ICD-10-CM | POA: Insufficient documentation

## 2015-03-25 DIAGNOSIS — S4992XD Unspecified injury of left shoulder and upper arm, subsequent encounter: Secondary | ICD-10-CM | POA: Diagnosis present

## 2015-03-25 DIAGNOSIS — Z87891 Personal history of nicotine dependence: Secondary | ICD-10-CM | POA: Diagnosis not present

## 2015-03-25 DIAGNOSIS — Z79899 Other long term (current) drug therapy: Secondary | ICD-10-CM | POA: Insufficient documentation

## 2015-03-25 DIAGNOSIS — Y658 Other specified misadventures during surgical and medical care: Secondary | ICD-10-CM | POA: Insufficient documentation

## 2015-03-25 DIAGNOSIS — S40012D Contusion of left shoulder, subsequent encounter: Secondary | ICD-10-CM | POA: Diagnosis not present

## 2015-03-25 DIAGNOSIS — S0993XD Unspecified injury of face, subsequent encounter: Secondary | ICD-10-CM | POA: Diagnosis not present

## 2015-03-25 DIAGNOSIS — S29001D Unspecified injury of muscle and tendon of front wall of thorax, subsequent encounter: Secondary | ICD-10-CM | POA: Diagnosis not present

## 2015-03-25 DIAGNOSIS — Z7984 Long term (current) use of oral hypoglycemic drugs: Secondary | ICD-10-CM | POA: Insufficient documentation

## 2015-03-25 DIAGNOSIS — J45909 Unspecified asthma, uncomplicated: Secondary | ICD-10-CM | POA: Insufficient documentation

## 2015-03-25 DIAGNOSIS — M25512 Pain in left shoulder: Secondary | ICD-10-CM

## 2015-03-25 DIAGNOSIS — S0990XD Unspecified injury of head, subsequent encounter: Secondary | ICD-10-CM | POA: Diagnosis not present

## 2015-03-25 DIAGNOSIS — E119 Type 2 diabetes mellitus without complications: Secondary | ICD-10-CM | POA: Diagnosis not present

## 2015-03-25 DIAGNOSIS — T8090XA Unspecified complication following infusion and therapeutic injection, initial encounter: Secondary | ICD-10-CM

## 2015-03-25 DIAGNOSIS — R51 Headache: Secondary | ICD-10-CM

## 2015-03-25 DIAGNOSIS — R519 Headache, unspecified: Secondary | ICD-10-CM

## 2015-03-25 NOTE — ED Notes (Signed)
Declined W/C at D/C and was escorted to lobby by RN. 
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
## 2015-04-07 ENCOUNTER — Ambulatory Visit: Payer: BLUE CROSS/BLUE SHIELD | Attending: Physician Assistant | Admitting: Physician Assistant

## 2015-04-07 VITALS — BP 137/88 | HR 71 | Temp 98.2°F | Resp 16 | Ht 65.0 in | Wt 170.0 lb

## 2015-04-07 DIAGNOSIS — I10 Essential (primary) hypertension: Secondary | ICD-10-CM | POA: Diagnosis present

## 2015-04-07 DIAGNOSIS — J45909 Unspecified asthma, uncomplicated: Secondary | ICD-10-CM | POA: Diagnosis present

## 2015-04-07 DIAGNOSIS — Z888 Allergy status to other drugs, medicaments and biological substances status: Secondary | ICD-10-CM | POA: Insufficient documentation

## 2015-04-07 DIAGNOSIS — Z79899 Other long term (current) drug therapy: Secondary | ICD-10-CM | POA: Diagnosis not present

## 2015-04-07 DIAGNOSIS — Z9889 Other specified postprocedural states: Secondary | ICD-10-CM | POA: Insufficient documentation

## 2015-04-07 DIAGNOSIS — Z4802 Encounter for removal of sutures: Secondary | ICD-10-CM | POA: Diagnosis not present

## 2015-04-07 DIAGNOSIS — E119 Type 2 diabetes mellitus without complications: Secondary | ICD-10-CM | POA: Diagnosis not present

## 2015-04-07 LAB — POCT GLYCOSYLATED HEMOGLOBIN (HGB A1C): HEMOGLOBIN A1C: 8.4

## 2015-04-07 LAB — GLUCOSE, POCT (MANUAL RESULT ENTRY): POC Glucose: 206 mg/dl — AB (ref 70–99)

## 2015-04-07 MED ORDER — HYDROCODONE-ACETAMINOPHEN 5-325 MG PO TABS: 1.0000 | ORAL_TABLET | ORAL | Status: DC | PRN
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
## 2015-04-15 ENCOUNTER — Emergency Department (HOSPITAL_COMMUNITY): Payer: BLUE CROSS/BLUE SHIELD

## 2015-04-15 ENCOUNTER — Observation Stay (HOSPITAL_COMMUNITY)
Admission: EM | Admit: 2015-04-15 | Discharge: 2015-04-18 | Disposition: A | Discharge status: Home / Self Care | Payer: BLUE CROSS/BLUE SHIELD | Attending: Internal Medicine | Admitting: Internal Medicine

## 2015-04-15 ENCOUNTER — Encounter (HOSPITAL_COMMUNITY): Payer: Self-pay | Admitting: *Deleted

## 2015-04-15 DIAGNOSIS — J45909 Unspecified asthma, uncomplicated: Secondary | ICD-10-CM | POA: Insufficient documentation

## 2015-04-15 DIAGNOSIS — E1310 Other specified diabetes mellitus with ketoacidosis without coma: Secondary | ICD-10-CM | POA: Diagnosis not present

## 2015-04-15 DIAGNOSIS — R109 Unspecified abdominal pain: Secondary | ICD-10-CM | POA: Diagnosis present

## 2015-04-15 DIAGNOSIS — Z87891 Personal history of nicotine dependence: Secondary | ICD-10-CM | POA: Insufficient documentation

## 2015-04-15 DIAGNOSIS — R739 Hyperglycemia, unspecified: Secondary | ICD-10-CM

## 2015-04-15 DIAGNOSIS — E1165 Type 2 diabetes mellitus with hyperglycemia: Secondary | ICD-10-CM | POA: Insufficient documentation

## 2015-04-15 DIAGNOSIS — E1110 Type 2 diabetes mellitus with ketoacidosis without coma: Secondary | ICD-10-CM | POA: Diagnosis present

## 2015-04-15 DIAGNOSIS — Z79899 Other long term (current) drug therapy: Secondary | ICD-10-CM | POA: Insufficient documentation

## 2015-04-15 DIAGNOSIS — I10 Essential (primary) hypertension: Secondary | ICD-10-CM | POA: Diagnosis not present

## 2015-04-15 DIAGNOSIS — E1143 Type 2 diabetes mellitus with diabetic autonomic (poly)neuropathy: Secondary | ICD-10-CM | POA: Diagnosis present

## 2015-04-15 DIAGNOSIS — K3184 Gastroparesis: Secondary | ICD-10-CM

## 2015-04-15 DIAGNOSIS — E119 Type 2 diabetes mellitus without complications: Secondary | ICD-10-CM

## 2015-04-15 DIAGNOSIS — E860 Dehydration: Principal | ICD-10-CM | POA: Insufficient documentation

## 2015-04-15 HISTORY — DX: Gastro-esophageal reflux disease without esophagitis: K21.9

## 2015-04-15 HISTORY — DX: Hyperglycemia, unspecified: R73.9

## 2015-04-15 LAB — URINALYSIS, ROUTINE W REFLEX MICROSCOPIC
Bilirubin Urine: NEGATIVE
Ketones, ur: 40 mg/dL — AB
Leukocytes, UA: NEGATIVE
Nitrite: NEGATIVE
Protein, ur: NEGATIVE mg/dL
SPECIFIC GRAVITY, URINE: 1.035 — AB (ref 1.005–1.030)
pH: 5 (ref 5.0–8.0)

## 2015-04-15 LAB — BASIC METABOLIC PANEL
ANION GAP: 19 — AB (ref 5–15)
Anion gap: 15 (ref 5–15)
BUN: 15 mg/dL (ref 6–20)
BUN: 17 mg/dL (ref 6–20)
CALCIUM: 8.7 mg/dL — AB (ref 8.9–10.3)
CHLORIDE: 101 mmol/L (ref 101–111)
CO2: 29 mmol/L (ref 22–32)
CO2: 29 mmol/L (ref 22–32)
CREATININE: 1.03 mg/dL — AB (ref 0.44–1.00)
Calcium: 8.2 mg/dL — ABNORMAL LOW (ref 8.9–10.3)
Chloride: 97 mmol/L — ABNORMAL LOW (ref 101–111)
Creatinine, Ser: 1.14 mg/dL — ABNORMAL HIGH (ref 0.44–1.00)
GFR calc Af Amer: >60 mL/min (ref >60)
GFR calc non Af Amer: >60 mL/min (ref >60)
GFR, EST NON AFRICAN AMERICAN: 54 mL/min — AB (ref >60)
GLUCOSE: 275 mg/dL — AB (ref 65–99)
GLUCOSE: 150 mg/dL — AB (ref 65–99)
Potassium: 3.2 mmol/L — ABNORMAL LOW (ref 3.5–5.1)
Potassium: 3.5 mmol/L (ref 3.5–5.1)
SODIUM: 145 mmol/L (ref 135–145)
Sodium: 145 mmol/L (ref 135–145)

## 2015-04-15 LAB — COMPREHENSIVE METABOLIC PANEL
ALBUMIN: 4.6 g/dL (ref 3.5–5.0)
ALT: 26 U/L (ref 14–54)
AST: 28 U/L (ref 15–41)
Alkaline Phosphatase: 58 U/L (ref 38–126)
Anion gap: 18 — ABNORMAL HIGH (ref 5–15)
BUN: 14 mg/dL (ref 6–20)
CHLORIDE: 92 mmol/L — AB (ref 101–111)
CO2: 30 mmol/L (ref 22–32)
CREATININE: 1.36 mg/dL — AB (ref 0.44–1.00)
Calcium: 9.8 mg/dL (ref 8.9–10.3)
GFR calc non Af Amer: 44 mL/min — ABNORMAL LOW (ref >60)
GFR, EST AFRICAN AMERICAN: 50 mL/min — AB (ref >60)
GLUCOSE: 346 mg/dL — AB (ref 65–99)
Potassium: 3.6 mmol/L (ref 3.5–5.1)
SODIUM: 140 mmol/L (ref 135–145)
Total Bilirubin: 0.9 mg/dL (ref 0.3–1.2)
Total Protein: 8.2 g/dL — ABNORMAL HIGH (ref 6.5–8.1)

## 2015-04-15 LAB — I-STAT VENOUS BLOOD GAS, ED
Acid-Base Excess: 13 mmol/L — ABNORMAL HIGH (ref 0.0–2.0)
BICARBONATE: 38 meq/L — AB (ref 20.0–24.0)
O2 SAT: 83 %
PCO2 VEN: 47.7 mmHg (ref 45.0–50.0)
PO2 VEN: 44 mmHg (ref 30.0–45.0)
TCO2: 39 mmol/L (ref 0–100)
pH, Ven: 7.509 — ABNORMAL HIGH (ref 7.250–7.300)

## 2015-04-15 LAB — CBG MONITORING, ED
GLUCOSE-CAPILLARY: 173 mg/dL — AB (ref 65–99)
GLUCOSE-CAPILLARY: 161 mg/dL — AB (ref 65–99)
Glucose-Capillary: 129 mg/dL — ABNORMAL HIGH (ref 65–99)
Glucose-Capillary: 160 mg/dL — ABNORMAL HIGH (ref 65–99)
Glucose-Capillary: 222 mg/dL — ABNORMAL HIGH (ref 65–99)

## 2015-04-15 LAB — CBC
HCT: 48.4 % — ABNORMAL HIGH (ref 36.0–46.0)
HEMOGLOBIN: 16.6 g/dL — AB (ref 12.0–15.0)
MCH: 31.7 pg (ref 26.0–34.0)
MCHC: 34.3 g/dL (ref 30.0–36.0)
MCV: 92.4 fL (ref 78.0–100.0)
Platelets: 201 10*3/uL (ref 150–400)
RBC: 5.24 MIL/uL — AB (ref 3.87–5.11)
RDW: 12.7 % (ref 11.5–15.5)
WBC: 7.6 10*3/uL (ref 4.0–10.5)

## 2015-04-15 LAB — URINE MICROSCOPIC-ADD ON
BACTERIA UA: NONE SEEN
WBC, UA: NONE SEEN WBC/hpf (ref 0–5)

## 2015-04-15 LAB — I-STAT CG4 LACTIC ACID, ED
Lactic Acid, Venous: 3.82 mmol/L (ref 0.5–2.0)
Lactic Acid, Venous: 1.84 mmol/L (ref 0.5–2.0)

## 2015-04-15 LAB — LIPASE, BLOOD: LIPASE: 35 U/L (ref 11–51)

## 2015-04-15 MED ORDER — INSULIN ASPART 100 UNIT/ML ~~LOC~~ SOLN: 5.0000 [IU] | Freq: Once | SUBCUTANEOUS | Status: DC

## 2015-04-15 MED ORDER — PROMETHAZINE HCL 25 MG/ML IJ SOLN
25.0000 mg | Freq: Once | INTRAMUSCULAR | Status: AC
  Administered 2015-04-15: 25 mg via INTRAVENOUS
  Filled 2015-04-15 (×2): qty 1

## 2015-04-15 MED ORDER — METOCLOPRAMIDE HCL 5 MG/ML IJ SOLN
10.0000 mg | Freq: Once | INTRAMUSCULAR | Status: AC
  Administered 2015-04-15: 10 mg via INTRAVENOUS
  Filled 2015-04-15: qty 2

## 2015-04-15 MED ORDER — SODIUM CHLORIDE 0.9 % IV SOLN
INTRAVENOUS | Status: DC
  Administered 2015-04-15: 1 [IU]/h via INTRAVENOUS
  Filled 2015-04-15: qty 2.5

## 2015-04-15 MED ORDER — ACETAMINOPHEN 325 MG PO TABS: 650.0000 mg | ORAL_TABLET | Freq: Four times a day (QID) | ORAL | Status: DC | PRN

## 2015-04-15 MED ORDER — METOPROLOL TARTRATE 50 MG PO TABS
50.0000 mg | ORAL_TABLET | Freq: Every day | ORAL | Status: DC
  Administered 2015-04-15 – 2015-04-18 (×4): 50 mg via ORAL
  Filled 2015-04-15: qty 1
  Filled 2015-04-15: qty 2
  Filled 2015-04-15: qty 1
  Filled 2015-04-15: qty 2

## 2015-04-15 MED ORDER — SODIUM CHLORIDE 0.9 % IV SOLN
INTRAVENOUS | Status: DC
  Administered 2015-04-15: 22:00:00 via INTRAVENOUS

## 2015-04-15 MED ORDER — AMLODIPINE BESYLATE 5 MG PO TABS
5.0000 mg | ORAL_TABLET | Freq: Every day | ORAL | Status: DC
  Administered 2015-04-15 – 2015-04-16 (×2): 5 mg via ORAL
  Filled 2015-04-15 (×2): qty 1

## 2015-04-15 MED ORDER — GABAPENTIN 300 MG PO CAPS
300.0000 mg | ORAL_CAPSULE | Freq: Every day | ORAL | Status: DC
Start: 2015-04-15 — End: 2015-04-18
  Administered 2015-04-15 – 2015-04-17 (×3): 300 mg via ORAL
  Filled 2015-04-15 (×3): qty 1

## 2015-04-15 MED ORDER — GLIMEPIRIDE 4 MG PO TABS
4.0000 mg | ORAL_TABLET | Freq: Once | ORAL | Status: AC
  Administered 2015-04-15: 4 mg via ORAL
  Filled 2015-04-15: qty 1

## 2015-04-15 MED ORDER — METOCLOPRAMIDE HCL 5 MG/ML IJ SOLN
10.0000 mg | Freq: Three times a day (TID) | INTRAMUSCULAR | Status: DC
  Administered 2015-04-15 – 2015-04-17 (×5): 10 mg via INTRAVENOUS
  Filled 2015-04-15 (×4): qty 2

## 2015-04-15 MED ORDER — HYDROCHLOROTHIAZIDE 25 MG PO TABS
12.5000 mg | ORAL_TABLET | Freq: Every day | ORAL | Status: DC
  Administered 2015-04-15: 12.5 mg via ORAL
  Filled 2015-04-15: qty 1

## 2015-04-15 MED ORDER — TRAMADOL HCL 50 MG PO TABS
50.0000 mg | ORAL_TABLET | Freq: Four times a day (QID) | ORAL | Status: DC | PRN
  Administered 2015-04-16: 50 mg via ORAL
  Filled 2015-04-15: qty 1

## 2015-04-15 MED ORDER — ENOXAPARIN SODIUM 40 MG/0.4ML ~~LOC~~ SOLN
40.0000 mg | SUBCUTANEOUS | Status: DC
  Administered 2015-04-15 – 2015-04-17 (×3): 40 mg via SUBCUTANEOUS
  Filled 2015-04-15 (×4): qty 0.4

## 2015-04-15 MED ORDER — SODIUM CHLORIDE 0.9% FLUSH
3.0000 mL | Freq: Two times a day (BID) | INTRAVENOUS | Status: DC
  Administered 2015-04-16 – 2015-04-18 (×4): 3 mL via INTRAVENOUS

## 2015-04-15 MED ORDER — DEXTROSE-NACL 5-0.45 % IV SOLN
INTRAVENOUS | Status: DC
  Administered 2015-04-15: 50 mL/h via INTRAVENOUS

## 2015-04-15 MED ORDER — SODIUM CHLORIDE 0.9 % IV SOLN
INTRAVENOUS | Status: AC
  Administered 2015-04-15: 19:00:00 via INTRAVENOUS

## 2015-04-15 MED ORDER — SODIUM CHLORIDE 0.9 % IV BOLUS (SEPSIS)
1000.0000 mL | Freq: Once | INTRAVENOUS | Status: AC
  Administered 2015-04-15: 1000 mL via INTRAVENOUS

## 2015-04-15 MED ORDER — HYDROCODONE-ACETAMINOPHEN 5-325 MG PO TABS
1.0000 | ORAL_TABLET | ORAL | Status: DC | PRN
  Administered 2015-04-16: 2 via ORAL
  Administered 2015-04-16: 1 via ORAL
  Administered 2015-04-16 – 2015-04-17 (×3): 2 via ORAL
  Administered 2015-04-17 – 2015-04-18 (×2): 1 via ORAL
  Filled 2015-04-15 (×3): qty 2
  Filled 2015-04-15 (×2): qty 1
  Filled 2015-04-15: qty 2
  Filled 2015-04-15: qty 1

## 2015-04-15 MED ORDER — POTASSIUM CHLORIDE 10 MEQ/100ML IV SOLN
INTRAVENOUS | Status: AC
Start: 2015-04-15 — End: 2015-04-16
  Administered 2015-04-15: 10 meq
  Filled 2015-04-15: qty 100

## 2015-04-15 MED ORDER — POTASSIUM CHLORIDE 10 MEQ/100ML IV SOLN
10.0000 meq | INTRAVENOUS | Status: AC
  Administered 2015-04-15 (×2): 10 meq via INTRAVENOUS
  Filled 2015-04-15 (×2): qty 100

## 2015-04-15 MED ORDER — AZITHROMYCIN 250 MG PO TABS
500.0000 mg | ORAL_TABLET | Freq: Once | ORAL | Status: AC
  Administered 2015-04-15: 500 mg via ORAL
  Filled 2015-04-15: qty 2

## 2015-04-15 MED ORDER — ACETAMINOPHEN 650 MG RE SUPP: 650.0000 mg | Freq: Four times a day (QID) | RECTAL | Status: DC | PRN

## 2015-04-15 MED ORDER — ONDANSETRON HCL 4 MG PO TABS: 4.0000 mg | ORAL_TABLET | Freq: Four times a day (QID) | ORAL | Status: DC | PRN

## 2015-04-15 MED ORDER — ONDANSETRON 4 MG PO TBDP
4.0000 mg | ORAL_TABLET | Freq: Once | ORAL | Status: AC | PRN
  Administered 2015-04-15: 4 mg via ORAL

## 2015-04-15 MED ORDER — IPRATROPIUM BROMIDE 0.06 % NA SOLN: 2.0000 | Freq: Four times a day (QID) | NASAL | Status: DC

## 2015-04-15 MED ORDER — INSULIN ASPART 100 UNIT/ML ~~LOC~~ SOLN
5.0000 [IU] | Freq: Once | SUBCUTANEOUS | Status: DC
Start: 2015-04-15 — End: 2015-04-15
  Filled 2015-04-15: qty 1

## 2015-04-15 MED ORDER — MORPHINE SULFATE (PF) 4 MG/ML IV SOLN
4.0000 mg | Freq: Once | INTRAVENOUS | Status: AC
  Administered 2015-04-15: 4 mg via INTRAVENOUS
  Filled 2015-04-15: qty 1

## 2015-04-15 MED ORDER — POTASSIUM CHLORIDE IN NACL 20-0.9 MEQ/L-% IV SOLN
Freq: Once | INTRAVENOUS | Status: AC
  Administered 2015-04-15: 15:00:00 via INTRAVENOUS
  Filled 2015-04-15: qty 1000

## 2015-04-15 MED ORDER — SIMVASTATIN 10 MG PO TABS
10.0000 mg | ORAL_TABLET | Freq: Every evening | ORAL | Status: DC
  Administered 2015-04-15 – 2015-04-17 (×3): 10 mg via ORAL
  Filled 2015-04-15 (×4): qty 1

## 2015-04-15 MED ORDER — ONDANSETRON 4 MG PO TBDP
ORAL_TABLET | ORAL | Status: AC
  Filled 2015-04-15: qty 1

## 2015-04-15 MED ORDER — BENAZEPRIL HCL 40 MG PO TABS
40.0000 mg | ORAL_TABLET | Freq: Every day | ORAL | Status: DC
  Administered 2015-04-15: 40 mg via ORAL
  Filled 2015-04-15 (×2): qty 1

## 2015-04-15 MED ORDER — PANTOPRAZOLE SODIUM 40 MG PO TBEC
40.0000 mg | DELAYED_RELEASE_TABLET | Freq: Every day | ORAL | Status: DC
  Administered 2015-04-16: 40 mg via ORAL
  Filled 2015-04-15: qty 1

## 2015-04-15 MED ORDER — ONDANSETRON HCL 4 MG/2ML IJ SOLN
4.0000 mg | Freq: Four times a day (QID) | INTRAMUSCULAR | Status: DC | PRN
  Administered 2015-04-15 – 2015-04-16 (×2): 4 mg via INTRAVENOUS
  Filled 2015-04-15 (×2): qty 2

## 2015-04-15 MED ORDER — ALBUTEROL SULFATE HFA 108 (90 BASE) MCG/ACT IN AERS: 1.0000 | INHALATION_SPRAY | Freq: Four times a day (QID) | RESPIRATORY_TRACT | Status: DC | PRN

## 2015-04-15 MED ORDER — HYDROMORPHONE HCL 1 MG/ML IJ SOLN
0.5000 mg | INTRAMUSCULAR | Status: DC | PRN
  Administered 2015-04-15 – 2015-04-17 (×6): 0.5 mg via INTRAVENOUS
  Filled 2015-04-15 (×6): qty 1

## 2015-04-15 MED ORDER — DEXTROSE 5 % IV SOLN
1.0000 g | Freq: Once | INTRAVENOUS | Status: AC
  Administered 2015-04-15: 1 g via INTRAVENOUS
  Filled 2015-04-15: qty 10

## 2015-04-15 NOTE — ED Notes (Signed)
Dr. Clarene Duke made aware of CBG result; Hold insulin at this time.

## 2015-04-15 NOTE — ED Notes (Signed)
[facility] paged 

## 2015-04-15 NOTE — ED Provider Notes (Addendum)
CSN: [id number]     Arrival date & time 04/15/15  0751 History   First MD Initiated Contact with Patient 04/15/15 [phone]     Chief Complaint  Patient presents with  . Emesis  . Abdominal Pain     (Consider location/radiation/quality/duration/timing/severity/associated sxs/prior Treatment) HPI Comments: 55 year old female with asthma, type 2 diabetes mellitus, and hypertension presents with sneezing, abdominal pain, and vomiting. Patient states that 2 days ago she began having cold symptoms including sneezing. Yesterday, she began having upper abdominal pain that has persisted throughout today and is severe and constant. She has had several episodes of vomiting and the pain is worse after vomiting. She denies any diarrhea or blood in her stool. No urinary symptoms. She denies any shortness of breath but does state that her bronchitis has been "acting up" recently. No sick contacts or recent travel. No significant NSAID or alcohol use.  Patient is a 55 y.o. female presenting with vomiting and abdominal pain. The history is provided by the patient.  Emesis Associated symptoms: abdominal pain   Abdominal Pain Associated symptoms: vomiting     Past Medical History  Diagnosis Date  . Asthma   . Diabetes mellitus without complication (HCC)   . Hypertension    Past Surgical History  Procedure Laterality Date  . Breast surgery    . Cesarean section     History reviewed. No pertinent family history. Social History  Substance Use Topics  . Smoking status: Former Smoker -- 0.00 packs/day    Quit date: 11/11/2014  . Smokeless tobacco: None  . Alcohol Use: Yes     Comment: occ    OB History    No data available     Review of Systems  Gastrointestinal: Positive for vomiting and abdominal pain.   10 Systems reviewed and are negative for acute change except as noted in the HPI.    Allergies  Sulfonamide derivatives and Ibuprofen  Home Medications   Prior to Admission medications    Medication Sig Start Date End Date Taking? Authorizing Provider  amLODipine (NORVASC) 5 MG tablet Take 1 tablet (5 mg total) by mouth daily. 01/01/12  Yes Reuben Likes, MD  colchicine (COLCRYS) 0.6 MG tablet Take 1 tablet (0.6 mg total) by mouth 2 (two) times daily. 01/28/14  Yes Devoria Albe, MD  cyclobenzaprine (FLEXERIL) 10 MG tablet Take 1 tablet (10 mg total) by mouth 3 (three) times daily as needed for muscle spasms. 03/23/15  Yes Trixie Dredge, PA-C  HYDROcodone-acetaminophen (NORCO/VICODIN) 5-325 MG tablet Take 1 tablet by mouth every 4 (four) hours as needed for moderate pain or severe pain. 04/07/15  Yes Tiffany Netta Cedars, PA-C  traMADol (ULTRAM) 50 MG tablet Take 1 tablet (50 mg total) by mouth every 6 (six) hours as needed. 11/24/14  Yes Hope Orlene Och, NP  albuterol (PROVENTIL HFA;VENTOLIN HFA) 108 (90 BASE) MCG/ACT inhaler Inhale 1-2 puffs into the lungs every 6 (six) hours as needed for wheezing or shortness of breath.    Historical Provider, MD  benazepril (LOTENSIN) 40 MG tablet Take 40 mg by mouth daily.    Historical Provider, MD  Canagliflozin-Metformin HCl (INVOKAMET) 150-500 MG TABS Take 1 tablet by mouth 2 (two) times daily.    Historical Provider, MD  fluconazole (DIFLUCAN) 100 MG tablet Take 100 mg by mouth daily as needed (itching).  01/07/15   Historical Provider, MD  fluticasone (FLONASE) 50 MCG/ACT nasal spray Place 2 sprays into both nostrils daily. Patient not taking: Reported on 03/23/2015 01/11/15  Mercedes Camprubi-Soms, PA-C  gabapentin (NEURONTIN) 300 MG capsule Take 300 mg by mouth at bedtime.     Historical Provider, MD  glimepiride (AMARYL) 4 MG tablet Take 4 mg by mouth 2 (two) times daily.     Historical Provider, MD  hydrochlorothiazide (HYDRODIURIL) 25 MG tablet Take 12.5 mg by mouth daily.     Historical Provider, MD  hydrOXYzine (ATARAX/VISTARIL) 25 MG tablet Take 25 mg by mouth 2 (two) times daily as needed for anxiety.  02/26/15   Historical Provider, MD  ipratropium  (ATROVENT) 0.06 % nasal spray Place 2 sprays into both nostrils 4 (four) times daily. Patient not taking: Reported on 03/23/2015 01/15/15   Linna Hoff, MD  loratadine (CLARITIN) 10 MG tablet Take 10 mg by mouth daily.    Historical Provider, MD  metoprolol (LOPRESSOR) 50 MG tablet Take 50 mg by mouth daily. 02/19/15   Historical Provider, MD  Multiple Vitamin (MULTIVITAMIN WITH MINERALS) TABS tablet Take 1 tablet by mouth daily.    Historical Provider, MD  ondansetron (ZOFRAN ODT) 4 MG disintegrating tablet Take 1 tablet (4 mg total) by mouth every 8 (eight) hours as needed for nausea or vomiting. Patient not taking: Reported on 03/23/2015 01/11/15   Mercedes Camprubi-Soms, PA-C  potassium chloride SA (K-DUR,KLOR-CON) 20 MEQ tablet Take 20 mEq by mouth daily.    Historical Provider, MD  ranitidine (ZANTAC) 300 MG tablet Take 300 mg by mouth daily.     Historical Provider, MD  simvastatin (ZOCOR) 10 MG tablet Take 10 mg by mouth daily.     Historical Provider, MD   BP 122/85 mmHg  Pulse 88  Temp(Src) 99.1 F (37.3 C) (Oral)  Resp 18  SpO2 93% Physical Exam  Constitutional: She is oriented to person, place, and time. She appears well-developed and well-nourished. She appears distressed.  Moaning, laying on side  HENT:  Head: Normocephalic and atraumatic.  Eyes: Conjunctivae are normal. Pupils are equal, round, and reactive to light.  Neck: Neck supple.  Cardiovascular: Normal rate, regular rhythm and normal heart sounds.   No murmur heard. Pulmonary/Chest: Effort normal and breath sounds normal. She has no wheezes.  Abdominal: Soft. Bowel sounds are normal. She exhibits no distension. There is no rebound and no guarding.  TTP midepigastrium  Musculoskeletal: She exhibits no edema.  Neurological: She is alert and oriented to person, place, and time.  Fluent speech  Skin: Skin is warm and dry.  Psychiatric: Judgment normal.  distressed  Nursing note and vitals reviewed.   ED Course   Procedures (including critical care time) Labs Review Labs Reviewed  COMPREHENSIVE METABOLIC PANEL - Abnormal; Notable for the following:    Chloride 92 (*)    Glucose, Bld 346 (*)    Creatinine, Ser 1.36 (*)    Total Protein 8.2 (*)    GFR calc non Af Amer 44 (*)    GFR calc Af Amer 50 (*)    Anion gap 18 (*)    All other components within normal limits  CBC - Abnormal; Notable for the following:    RBC 5.24 (*)    Hemoglobin 16.6 (*)    HCT 48.4 (*)    All other components within normal limits  LIPASE, BLOOD  URINALYSIS, ROUTINE W REFLEX MICROSCOPIC (NOT AT [facility])    Imaging Review No results found. I have personally reviewed and evaluated these lab results as part of my medical decision-making.   EKG Interpretation None     Medications  ondansetron (ZOFRAN-ODT)  4 MG disintegrating tablet (not administered)  glimepiride (AMARYL) tablet 4 mg (not administered)  insulin aspart (novoLOG) injection 5 Units (not administered)  ondansetron (ZOFRAN-ODT) disintegrating tablet 4 mg (4 mg Oral Given 04/15/15 0835)  sodium chloride 0.9 % bolus 1,000 mL (0 mLs Intravenous Stopped 04/15/15 1100)  promethazine (PHENERGAN) injection 25 mg (25 mg Intravenous Given 04/15/15 1005)  sodium chloride 0.9 % bolus 1,000 mL (0 mLs Intravenous Stopped 04/15/15 1207)  metoCLOPramide (REGLAN) injection 10 mg (10 mg Intravenous Given 04/15/15 1207)  morphine 4 MG/ML injection 4 mg (4 mg Intravenous Given 04/15/15 1252)  0.9 % NaCl with KCl 20 mEq/ L  infusion ( Intravenous New Bag/Given 04/15/15 1449)    MDM   Final diagnoses:  Hyperglycemia  Dehydration   Pt p/w a few days of cold-like symptoms followed by upper abdominal pain and vomiting. Patient was laying on side, moaning, and in mild distress. Vital signs unremarkable. O2 sat during my examination was 98% on room air with no wheezing, work of breathing. Midepigastric tenderness noted with no abdominal distention or lower abdominal tenderness.  Obtained above labs which showed normal lipase and LFTs, glucose 346 with Cr 1.36 and AG 18. After 2 L of IV fluids, patient's repeat BMP showed potassium 3.2, CO2 29, glucose 275, creatinine 1.14, anion gap 19. UA shows small amount of ketones. Initial lactate of 3.82 corrected after IV fluids. The elevated anion gap, hyperglycemia, and urine ketones suggest DKA, however the patient's VBG shows pH 7.51, CO2 47. Gave the patient her home dose of glyburide and later 5 units IV and NovoLog. Gave pt 3rd L of NS w/ KCl.  Patient continued to have vomiting requiring Phenergan and later Reglan. Because of her persistent abdominal pain and vomiting, obtained CT of the abdomen and pelvis to rule out obstruction. CT was negative for acute process to explain the patient's symptoms.  I discussed the patient's lab work findings and persistent anion gap [facility] hospital[facility], Dr. Konrad Dolores. We agreed that the patient's labwork is not completely consistent w/ DKA, however it is concerning for insulin resistance requiring future addition of insulin in her home medication regimen. Discussed admission for observation and correction of AG; pt in agreement. Pt admitted to medicine for further care.   Laurence Spates, MD 04/15/15 1625  I was reviewing patient's imaging studies and noted that he has some patchy densities at both lung bases with differential including pneumonia. Pt had denied significant cough. Because she endorses "bronchitis" problems recently and O2 sats have been low 90s while at rest, I gave CAP treatment with ceftriaxone and azithromycin.  Laurence Spates, MD 04/15/15 [phone]

## 2015-04-15 NOTE — ED Notes (Signed)
PT reports no pain at this time. PT reports nausea has improved, but is not gone.

## 2015-04-15 NOTE — ED Notes (Signed)
[facility] paged

## 2015-04-15 NOTE — ED Notes (Signed)
MD Little at the bedside  

## 2015-04-15 NOTE — ED Notes (Signed)
Pt reports sneezing started 2 days ago and ABD pain with vomiting started on Tuesday.

## 2015-04-15 NOTE — ED Notes (Signed)
PT is in CT at this time

## 2015-04-15 NOTE — ED Notes (Signed)
PT continues to actively vomit. PT vomits 500cc brown liquid at this time

## 2015-04-15 NOTE — ED Notes (Signed)
PT placed on 2L O2 via [place] at this time

## 2015-04-15 NOTE — ED Notes (Signed)
PT requests pain medicine. Dr. Clarene Duke made aware. PT has had 3 episodes of vomiting brown liquid in last hour.

## 2015-04-15 NOTE — ED Notes (Addendum)
hospitalist requests that insulin drip and d5 0.45% be started; Dr. Richardson Dopp is made aware of lab results and glucose

## 2015-04-15 NOTE — ED Notes (Signed)
MD at bedside. 

## 2015-04-15 NOTE — H&P (Signed)
[facility] History and Physical  DEZTINEE LOHMEYER UEA:[id number] DOB: [birth year] DOA: 04/15/2015  Referring physician:  PCP: Dorrene German, MD   Chief Complaint: Diabetic Ketoacidosis  HPI: DERICKA OSTENSON is a 55 y.o. female with a history of DM2 on oral agents, admitted with 44 hour history of worsening abdominal pain with vomiting. His was preceded by an episode of sneezing,  thus she was attributing this nausea to increasing mucus buildup. However, as the symptoms did not resolve, the patient presented to the emergency department for further evaluation. She describes the pain as 10 out of 10, from the epigastrium to the right upper quadrant, accompanied by brown emesis, but no diarrhea. She states that cannot count the times that she vomited, but she cannot keep anything down.She had taken Gatorade in the setting of dehydration. She denies any blood in the stools. She denies any urinary symptoms. She denies any shortness of breath at this time, or hemoptysis. No recent travels, sick contacts,. She denies any changes in her fluid consumption That could be suspicious for foot poisoning. She denies any changes in her medications. She is compliant with her oral antiglycemics meds. She denies any significant NSAID or alcohol use.  Of note, because of incidental infiltrates found per CT of the abdomen, she was treated at ED with Zithromax and Rocephin x1.No cultures were drawn prior to antibiotics. PAtient is asymptomatic  Review of Systems  Constitutional: Negative.   HENT: Negative.   Eyes: Negative.   Respiratory: Negative.        "bronchitis is acting up"  Cardiovascular: Negative.   Gastrointestinal: Positive for heartburn, nausea, vomiting and abdominal pain. Negative for diarrhea, constipation and blood in stool.  Genitourinary: Negative.   Musculoskeletal: Negative.   Skin: Negative.   Neurological: Negative.   Endo/Heme/Allergies: Negative.   Psychiatric/Behavioral:  Negative.     Past Medical History  Diagnosis Date  . Asthma   . Diabetes mellitus without complication (HCC)   . Hypertension    Past Surgical History  Procedure Laterality Date  . Breast surgery    . Cesarean section     Social History:  reports that she quit smoking about 5 months ago. She does not have any smokeless tobacco history on file. She reports that she drinks alcohol. She reports that she does not use illicit drugs.  Allergies  Allergen Reactions  . Sulfonamide Derivatives Other (See Comments)    Andria Meuse johnson syndrome  . Ibuprofen Swelling    History reviewed. No pertinent family history.   Prior to Admission medications   Medication Sig Start Date End Date Taking? Authorizing Provider  albuterol (PROVENTIL HFA;VENTOLIN HFA) 108 (90 BASE) MCG/ACT inhaler Inhale 1-2 puffs into the lungs every 6 (six) hours as needed for wheezing or shortness of breath.   Yes Historical Provider, MD  amLODipine (NORVASC) 5 MG tablet Take 1 tablet (5 mg total) by mouth daily. 01/01/12  Yes Reuben Likes, MD  benazepril (LOTENSIN) 40 MG tablet Take 40 mg by mouth daily.   Yes Historical Provider, MD  Canagliflozin-Metformin HCl (INVOKAMET) 150-500 MG TABS Take 1 tablet by mouth 2 (two) times daily.   Yes Historical Provider, MD  colchicine (COLCRYS) 0.6 MG tablet Take 1 tablet (0.6 mg total) by mouth 2 (two) times daily. 01/28/14  Yes Devoria Albe, MD  cyclobenzaprine (FLEXERIL) 10 MG tablet Take 1 tablet (10 mg total) by mouth 3 (three) times daily as needed for muscle spasms. 03/23/15  Yes Trixie Dredge, PA-C  gabapentin (NEURONTIN) 300 MG capsule Take 300 mg by mouth at bedtime.    Yes Historical Provider, MD  glimepiride (AMARYL) 4 MG tablet Take 4 mg by mouth 2 (two) times daily.    Yes Historical Provider, MD  hydrochlorothiazide (HYDRODIURIL) 25 MG tablet Take 12.5 mg by mouth daily.    Yes Historical Provider, MD  HYDROcodone-acetaminophen (NORCO/VICODIN) 5-325 MG tablet Take 1 tablet  by mouth every 4 (four) hours as needed for moderate pain or severe pain. 04/07/15  Yes Tiffany Netta Cedars, PA-C  hydrOXYzine (ATARAX/VISTARIL) 25 MG tablet Take 25 mg by mouth 2 (two) times daily as needed for anxiety.  02/26/15  Yes Historical Provider, MD  loratadine (CLARITIN) 10 MG tablet Take 10 mg by mouth daily.   Yes Historical Provider, MD  metoprolol (LOPRESSOR) 50 MG tablet Take 50 mg by mouth daily. 02/19/15  Yes Historical Provider, MD  Multiple Vitamin (MULTIVITAMIN WITH MINERALS) TABS tablet Take 1 tablet by mouth daily.   Yes Historical Provider, MD  potassium chloride SA (K-DUR,KLOR-CON) 20 MEQ tablet Take 20 mEq by mouth daily.   Yes Historical Provider, MD  ranitidine (ZANTAC) 300 MG tablet Take 300 mg by mouth daily.    Yes Historical Provider, MD  simvastatin (ZOCOR) 10 MG tablet Take 10 mg by mouth daily.    Yes Historical Provider, MD  traMADol (ULTRAM) 50 MG tablet Take 1 tablet (50 mg total) by mouth every 6 (six) hours as needed. 11/24/14  Yes Hope Orlene Och, NP  fluconazole (DIFLUCAN) 100 MG tablet Take 100 mg by mouth daily as needed (itching).  01/07/15   Historical Provider, MD  fluticasone (FLONASE) 50 MCG/ACT nasal spray Place 2 sprays into both nostrils daily. Patient not taking: Reported on 03/23/2015 01/11/15   Mercedes Camprubi-Soms, PA-C  ipratropium (ATROVENT) 0.06 % nasal spray Place 2 sprays into both nostrils 4 (four) times daily. Patient not taking: Reported on 03/23/2015 01/15/15   Linna Hoff, MD  ondansetron (ZOFRAN ODT) 4 MG disintegrating tablet Take 1 tablet (4 mg total) by mouth every 8 (eight) hours as needed for nausea or vomiting. Patient not taking: Reported on 03/23/2015 01/11/15   Mercedes Camprubi-Soms, PA-C   Physical Exam: Filed Vitals:   04/15/15 1400 04/15/15 1445 04/15/15 1500 04/15/15 1515  BP: 160/87 148/89 154/88 155/87  Pulse: 84 79 86 86  Temp:      TempSrc:      Resp: 16 16 16 16   SpO2: 98% 99% 93% 89%    Wt Readings from Last 3  Encounters:  04/07/15 77.111 kg (170 lb)  03/23/15 72.576 kg (160 lb)  09/07/14 72.576 kg (160 lb)    Physical Exam  Constitutional: She is oriented to person, place, and time and well-developed, well-nourished, and in no distress.  HENT:  Head: Normocephalic and atraumatic.  Mouth/Throat: No oropharyngeal exudate.  Eyes: EOM are normal. Pupils are equal, round, and reactive to light. No scleral icterus.  Neck: Normal range of motion. Neck supple. No JVD present. No thyromegaly present.  Cardiovascular: Normal rate and regular rhythm.  Exam reveals no gallop and no friction rub.   No murmur heard. Pulmonary/Chest: Effort normal and breath sounds normal. No respiratory distress. She has no wheezes. She has no rales. She exhibits no tenderness.  Abdominal: Soft. Bowel sounds are normal. She exhibits no distension and no mass. There is no tenderness. There is no rebound and no guarding.  Musculoskeletal: Normal range of motion. She exhibits no edema or tenderness.  Lymphadenopathy:  She has no cervical adenopathy.  Neurological: She is alert and oriented to person, place, and time. She has normal reflexes. No cranial nerve deficit. She exhibits normal muscle tone. Coordination normal.  Skin: Skin is warm and dry. No rash noted. No erythema. No pallor (.msasthma).  Psychiatric: Memory, affect and judgment normal.            Labs on Admission:  Basic Metabolic Panel:  Recent Labs Lab 04/15/15 0815 04/15/15 1250  NA 140 145  K 3.6 3.2*  CL 92* 97*  CO2 30 29  GLUCOSE 346* 275*  BUN 14 15  CREATININE 1.36* 1.14*  CALCIUM 9.8 8.7*    Liver Function Tests:  Recent Labs Lab 04/15/15 0815  AST 28  ALT 26  ALKPHOS 58  BILITOT 0.9  PROT 8.2*  ALBUMIN 4.6    Recent Labs Lab 04/15/15 0815  LIPASE 35   No results for input(s): AMMONIA in the last 168 hours.  CBC:  Recent Labs Lab 04/15/15 0815  WBC 7.6  HGB 16.6*  HCT 48.4*  MCV 92.4  PLT 201    Cardiac  Enzymes: No results for input(s): CKTOTAL, CKMB, CKMBINDEX, TROPONINI in the last 168 hours.  BNP (last 3 results) No results for input(s): BNP in the last 8760 hours.  ProBNP (last 3 results) No results for input(s): PROBNP in the last 8760 hours.   CREATININE: 1.14 mg/dL ABNORMAL (45/40/98 1191) Estimated creatinine clearance - 58.6 mL/min  CBG:  Recent Labs Lab 04/15/15 1423  GLUCAP 222*    Radiological Exams on Admission: Ct Abdomen Pelvis Wo Contrast  04/15/2015  CLINICAL DATA:  Upper abdominal pain with nausea and vomiting over the last 2 days. EXAM: CT ABDOMEN AND PELVIS WITHOUT CONTRAST TECHNIQUE: Multidetector CT imaging of the abdomen and pelvis was performed following the standard protocol without IV contrast. COMPARISON:  None. FINDINGS: There is minimal patchy density both lung bases that could be scarring, atelectasis or minimal basilar pneumonia. No pleural or pericardial fluid. The liver has a normal appearance without contrast. No calcified gallstones. The spleen is normal. The pancreas is normal. The adrenal glands are normal. The kidneys are normal without evidence of stone or hydronephrosis. 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 There is minimal atherosclerosis of the aorta. No aneurysm. The IVC is normal. No retroperitoneal mass or adenopathy. No free intraperitoneal fluid or air. No bowel pathology is seen. Normal appearing appendix. Bladder appears normal. Uterus and adnexal regions appear normal. No significant bone finding. IMPRESSION: No acute or significant abdominal finding. 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 Mild patchy density at both lung bases. This could possibly represent mild basilar pneumonia. However, atelectasis or scarring could have a similar appearance. Electronically Signed   By: Paulina Fusi M.D.   On: 04/15/2015 14:27       Assessment/Plan Principal Problem:   Diabetic ketoacidosis  (HCC) Active Problems:   Diabetes (HCC)   Essential hypertension   Asthma  Diabetic Ketoacidosis (DKA), early onset, likely precipitated by viral gastroenteritis with gastroparesis. Admission Glucose was 349, Anion Gap 18, Lactic Acid 3.82, ketones in urine, K 3.2.  At the ED she received  IV fluid only without insulin,. She is insulin naive as she was on oral agents only. Anion Gap continues to be at 19. Current blood sugar is 275. Her CT of the abdomen nice negative for acute findings. Admit for observation on telemetry Glucomander. Hold oral hypoglycemics  Potassium replenishment  IV meds for abdominal pain control We will start Reglan for gastroparesis to improve her symptoms.   Asthma without exacerbation  Abdominal CT showed incidental Mild patchy density at both lung bases, but she is asymptomatic. She received IV ZithromaxAnd Rocephin at the ED.The patient is afebrile. No cultures were drawn We will hold the antibiotics at this time and continue to monitor Continue inhalers as needed while in hospital.     Hypertension BP 155/87 mmHg  Pulse 86  Temp(Src) 99.1 F (37.3 C) (Oral)  Resp 16  SpO2 89% Controlled  Continue home anti-hypertensive medications with Lotensin, Norvasc, HCTZ  Hyperlipidemia.  Continue Zocor  Code Status: Full Code   DVT Prophylaxis: Lovenox Family Communication: at bedside Disposition Plan: Pending Improvement. Admitted for observation in tele bed. Expected LOS 24-48 hrs    [facility] E,PA-C [facility] [email or web address] Password [email or web address]

## 2015-04-15 NOTE — ED Notes (Signed)
Report given to [place], Neila Gear C

## 2015-04-15 NOTE — ED Notes (Signed)
PT actively vomiting. EDP made aware

## 2015-04-15 NOTE — ED Notes (Signed)
Dr. Richardson Dopp requests that insulin drip be started with D5 0.45% NS.

## 2015-04-15 NOTE — ED Notes (Signed)
PT reports nausea is "so-so." PT reports she has vomited once since receiving last dose of phenergan. PT requests ice chips at this time. PT is aware of plan of care

## 2015-04-16 ENCOUNTER — Encounter (HOSPITAL_COMMUNITY): Payer: Self-pay | Admitting: General Practice

## 2015-04-16 DIAGNOSIS — E1310 Other specified diabetes mellitus with ketoacidosis without coma: Secondary | ICD-10-CM

## 2015-04-16 DIAGNOSIS — K3184 Gastroparesis: Secondary | ICD-10-CM

## 2015-04-16 DIAGNOSIS — I10 Essential (primary) hypertension: Secondary | ICD-10-CM | POA: Diagnosis not present

## 2015-04-16 DIAGNOSIS — E860 Dehydration: Secondary | ICD-10-CM | POA: Diagnosis not present

## 2015-04-16 LAB — GLUCOSE, CAPILLARY
GLUCOSE-CAPILLARY: 88 mg/dL (ref 65–99)
Glucose-Capillary: 132 mg/dL — ABNORMAL HIGH (ref 65–99)

## 2015-04-16 LAB — CBG MONITORING, ED
GLUCOSE-CAPILLARY: 141 mg/dL — AB (ref 65–99)
GLUCOSE-CAPILLARY: 144 mg/dL — AB (ref 65–99)
GLUCOSE-CAPILLARY: 147 mg/dL — AB (ref 65–99)
GLUCOSE-CAPILLARY: 172 mg/dL — AB (ref 65–99)
Glucose-Capillary: 146 mg/dL — ABNORMAL HIGH (ref 65–99)
Glucose-Capillary: 142 mg/dL — ABNORMAL HIGH (ref 65–99)
Glucose-Capillary: 140 mg/dL — ABNORMAL HIGH (ref 65–99)
Glucose-Capillary: 137 mg/dL — ABNORMAL HIGH (ref 65–99)

## 2015-04-16 LAB — BASIC METABOLIC PANEL
Anion gap: 14 (ref 5–15)
Anion gap: 15 (ref 5–15)
BUN: 17 mg/dL (ref 6–20)
BUN: 19 mg/dL (ref 6–20)
CALCIUM: 8.2 mg/dL — AB (ref 8.9–10.3)
CALCIUM: 8.2 mg/dL — AB (ref 8.9–10.3)
CHLORIDE: 99 mmol/L — AB (ref 101–111)
CO2: 29 mmol/L (ref 22–32)
CO2: 27 mmol/L (ref 22–32)
CREATININE: 0.98 mg/dL (ref 0.44–1.00)
CREATININE: 0.98 mg/dL (ref 0.44–1.00)
Chloride: 100 mmol/L — ABNORMAL LOW (ref 101–111)
Glucose, Bld: 156 mg/dL — ABNORMAL HIGH (ref 65–99)
Glucose, Bld: 156 mg/dL — ABNORMAL HIGH (ref 65–99)
Potassium: 3.8 mmol/L (ref 3.5–5.1)
Potassium: 3.7 mmol/L (ref 3.5–5.1)
SODIUM: 142 mmol/L (ref 135–145)
SODIUM: 142 mmol/L (ref 135–145)

## 2015-04-16 LAB — CBC
HCT: 41.5 % (ref 36.0–46.0)
Hemoglobin: 13.3 g/dL (ref 12.0–15.0)
MCH: 30.7 pg (ref 26.0–34.0)
MCHC: 32 g/dL (ref 30.0–36.0)
MCV: 95.8 fL (ref 78.0–100.0)
PLATELETS: 166 10*3/uL (ref 150–400)
RBC: 4.33 MIL/uL (ref 3.87–5.11)
RDW: 13 % (ref 11.5–15.5)
WBC: 6.2 10*3/uL (ref 4.0–10.5)

## 2015-04-16 MED ORDER — ALBUTEROL SULFATE (2.5 MG/3ML) 0.083% IN NEBU: 2.5000 mg | INHALATION_SOLUTION | Freq: Four times a day (QID) | RESPIRATORY_TRACT | Status: DC | PRN

## 2015-04-16 MED ORDER — DEXTROSE 5 % IV SOLN
500.0000 mg | INTRAVENOUS | Status: DC
  Administered 2015-04-16 – 2015-04-17 (×2): 500 mg via INTRAVENOUS
  Filled 2015-04-16 (×4): qty 500

## 2015-04-16 MED ORDER — INSULIN ASPART 100 UNIT/ML ~~LOC~~ SOLN
0.0000 [IU] | Freq: Three times a day (TID) | SUBCUTANEOUS | Status: DC
  Administered 2015-04-16 (×2): 1 [IU] via SUBCUTANEOUS
  Administered 2015-04-16: 2 [IU] via SUBCUTANEOUS
  Administered 2015-04-17 – 2015-04-18 (×3): 1 [IU] via SUBCUTANEOUS
  Filled 2015-04-16: qty 1

## 2015-04-16 MED ORDER — SODIUM CHLORIDE 0.9 % IV SOLN
INTRAVENOUS | Status: DC
  Administered 2015-04-16 – 2015-04-18 (×4): via INTRAVENOUS

## 2015-04-16 MED ORDER — ALBUTEROL SULFATE (2.5 MG/3ML) 0.083% IN NEBU
2.5000 mg | INHALATION_SOLUTION | Freq: Four times a day (QID) | RESPIRATORY_TRACT | Status: DC
  Administered 2015-04-16 – 2015-04-17 (×3): 2.5 mg via RESPIRATORY_TRACT
  Filled 2015-04-16 (×3): qty 3

## 2015-04-16 MED ORDER — DEXTROSE 5 % IV SOLN
1.0000 g | INTRAVENOUS | Status: DC
  Administered 2015-04-16 – 2015-04-18 (×3): 1 g via INTRAVENOUS
  Filled 2015-04-16 (×4): qty 10

## 2015-04-16 MED ORDER — INSULIN GLARGINE 100 UNIT/ML ~~LOC~~ SOLN
15.0000 [IU] | Freq: Every day | SUBCUTANEOUS | Status: DC
  Administered 2015-04-16: 15 [IU] via SUBCUTANEOUS
  Filled 2015-04-16 (×2): qty 0.15

## 2015-04-16 MED ORDER — PANTOPRAZOLE SODIUM 40 MG PO TBEC
40.0000 mg | DELAYED_RELEASE_TABLET | Freq: Two times a day (BID) | ORAL | Status: DC
  Administered 2015-04-16 – 2015-04-17 (×2): 40 mg via ORAL
  Filled 2015-04-16 (×2): qty 1

## 2015-04-16 NOTE — Progress Notes (Signed)
[facility] PROGRESS NOTE  Kelly Santana VWU:[id number] DOB: [birth year] DOA: 04/15/2015 PCP: Dorrene German, MD  Assessment/Plan: 1-DKA, Diabetes uncontrolled.  Gap close, bicarb at 27. Transition from insulin Gtt to lantus.  SSI.  Hold glipizide, not eating a lot.   2-PNA; CT abdomen showed infiltrates bases. Patient complaining  Of cough. Continue with ceftriaxone and azithromycin.   3-HTN;SBP in the 100. Will hold BP medications.   4-Asthma; continue with nebulizer.   5-Abdominal pain; change protonix to BID. CT abdomen negative. Might be related to gastroparesis.   Code Status: Full Code.  Family Communication: Care discussed with patient  Disposition Plan: home in 24 to 48 hours.    Consultants:  none  Procedures:  none  Antibiotics:  Ceftriaxone 2-15  Azithromycin 2-15  HPI/Subjective: Feeling better, decrease appetite. Still with abdominal pain   Objective: Filed Vitals:   04/16/15 0530 04/16/15 0745  BP: 132/76 126/81  Pulse: 71 67  Temp:    Resp: 17 16    Intake/Output Summary (Last 24 hours) at 04/16/15 0935 Last data filed at 04/15/15 1910  Gross per 24 hour  Intake      0 ml  Output    800 ml  Net   -800 ml   There were no vitals filed for this visit.  Exam:   General:  NAD  Cardiovascular: S 1, S 2 RRR  Respiratory: CTA  Abdomen: BS present, soft, nt  Musculoskeletal: no edema   Data Reviewed: Basic Metabolic Panel:  Recent Labs Lab 04/15/15 0815 04/15/15 1250 04/15/15 2138 04/16/15 0112 04/16/15 0443  NA 140 145 145 142 142  K 3.6 3.2* 3.5 3.8 3.7  CL 92* 97* 101 99* 100*  CO2 GLUCOSE 346* 275* 150* 156* 156*  BUN CREATININE 1.36* 1.14* 1.03* 0.98 0.98  CALCIUM 9.8 8.7* 8.2* 8.2* 8.2*   Liver Function Tests:  Recent Labs Lab 04/15/15 0815  AST 28  ALT 26  ALKPHOS 58  BILITOT 0.9  PROT 8.2*  ALBUMIN 4.6    Recent Labs Lab 04/15/15 0815  LIPASE 35   No  results for input(s): AMMONIA in the last 168 hours. CBC:  Recent Labs Lab 04/15/15 0815 04/16/15 0418  WBC 7.6 6.2  HGB 16.6* 13.3  HCT 48.4* 41.5  MCV 92.4 95.8  PLT 201 166   Cardiac Enzymes: No results for input(s): CKTOTAL, CKMB, CKMBINDEX, TROPONINI in the last 168 hours. BNP (last 3 results) No results for input(s): BNP in the last 8760 hours.  ProBNP (last 3 results) No results for input(s): PROBNP in the last 8760 hours.  CBG:  Recent Labs Lab 04/16/15 0307 04/16/15 0418 04/16/15 0529 04/16/15 0634 04/16/15 0738  GLUCAP 147* 142* 144* 140* 137*    No results found for this or any previous visit (from the past 240 hour(s)).   Studies: Ct Abdomen Pelvis Wo Contrast  04/15/2015  CLINICAL DATA:  Upper abdominal pain with nausea and vomiting over the last 2 days. EXAM: CT ABDOMEN AND PELVIS WITHOUT CONTRAST TECHNIQUE: Multidetector CT imaging of the abdomen and pelvis was performed following the standard protocol without IV contrast. COMPARISON:  None. FINDINGS: There is minimal patchy density both lung bases that could be scarring, atelectasis or minimal basilar pneumonia. No pleural or pericardial fluid. The liver has a normal appearance without contrast. No calcified gallstones. The spleen is normal. The pancreas is normal. The adrenal glands are normal. The kidneys  are normal without evidence of stone or hydronephrosis. 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 There is minimal atherosclerosis of the aorta. No aneurysm. The IVC is normal. No retroperitoneal mass or adenopathy. No free intraperitoneal fluid or air. No bowel pathology is seen. Normal appearing appendix. Bladder appears normal. Uterus and adnexal regions appear normal. No significant bone finding. IMPRESSION: No acute or significant abdominal finding. 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 Mild patchy density at both lung bases. This could possibly  represent mild basilar pneumonia. However, atelectasis or scarring could have a similar appearance. Electronically Signed   By: Paulina Fusi M.D.   On: 04/15/2015 14:27    Scheduled Meds: . amLODipine  5 mg Oral Daily  . benazepril  40 mg Oral Daily  . enoxaparin (LOVENOX) injection  40 mg Subcutaneous Q24H  . gabapentin  300 mg Oral QHS  . insulin aspart  0-9 Units Subcutaneous TID WC  . insulin aspart  5 Units Intravenous Once  . insulin glargine  15 Units Subcutaneous Daily  . metoCLOPramide (REGLAN) injection  10 mg Intravenous 3 times per day  . metoprolol  50 mg Oral Daily  . pantoprazole  40 mg Oral Daily  . simvastatin  10 mg Oral QPM  . sodium chloride flush  3 mL Intravenous Q12H   Continuous Infusions: . sodium chloride 75 mL/hr at 04/15/15 2154  . dextrose 5 % and 0.45% NaCl Stopped (04/16/15 0745)  . insulin (NOVOLIN-R) infusion Stopped (04/16/15 0746)    Principal Problem:   Diabetic ketoacidosis (HCC) Active Problems:   Diabetes (H[facility])   Essential hypertension   Asthma   Hyperglycemia   Gastroparesis    Time spent: 25 minutes,.     Hartley Barefoot A  [facility] Pager [phone]. If 7PM-7AM, please contact night-coverage at [email or web address], password [facility] 04/16/2015, 9:35 AM

## 2015-04-16 NOTE — ED Notes (Signed)
Meal ordered.  Meds ordered from pharmacy.

## 2015-04-16 NOTE — ED Notes (Signed)
Patient CBG WAS 137, The Nurse was informed. A diabetic diet was ordered for patient.

## 2015-04-16 NOTE — ED Notes (Signed)
Pt's O2 level dropped to the high 80"s, was placed on 2L of O2 and levels increased to 98%

## 2015-04-16 NOTE — ED Notes (Signed)
Pt ambulated to the bathroom w/o any problems

## 2015-04-17 DIAGNOSIS — I10 Essential (primary) hypertension: Secondary | ICD-10-CM | POA: Diagnosis not present

## 2015-04-17 DIAGNOSIS — E1310 Other specified diabetes mellitus with ketoacidosis without coma: Secondary | ICD-10-CM | POA: Diagnosis not present

## 2015-04-17 DIAGNOSIS — K3184 Gastroparesis: Secondary | ICD-10-CM | POA: Diagnosis not present

## 2015-04-17 LAB — CBC
HCT: 37.6 % (ref 36.0–46.0)
Hemoglobin: 12.5 g/dL (ref 12.0–15.0)
MCH: 31.4 pg (ref 26.0–34.0)
MCHC: 33.2 g/dL (ref 30.0–36.0)
MCV: 94.5 fL (ref 78.0–100.0)
PLATELETS: 145 10*3/uL — AB (ref 150–400)
RBC: 3.98 MIL/uL (ref 3.87–5.11)
RDW: 12.7 % (ref 11.5–15.5)
WBC: 5.7 10*3/uL (ref 4.0–10.5)

## 2015-04-17 LAB — GLUCOSE, CAPILLARY
GLUCOSE-CAPILLARY: 133 mg/dL — AB (ref 65–99)
GLUCOSE-CAPILLARY: 90 mg/dL (ref 65–99)
Glucose-Capillary: 123 mg/dL — ABNORMAL HIGH (ref 65–99)
Glucose-Capillary: 112 mg/dL — ABNORMAL HIGH (ref 65–99)

## 2015-04-17 LAB — BASIC METABOLIC PANEL
Anion gap: 11 (ref 5–15)
BUN: 13 mg/dL (ref 6–20)
CO2: 27 mmol/L (ref 22–32)
CREATININE: 0.83 mg/dL (ref 0.44–1.00)
Calcium: 7.9 mg/dL — ABNORMAL LOW (ref 8.9–10.3)
Chloride: 101 mmol/L (ref 101–111)
GFR calc Af Amer: >60 mL/min (ref >60)
GLUCOSE: 94 mg/dL (ref 65–99)
POTASSIUM: 3.4 mmol/L — AB (ref 3.5–5.1)
SODIUM: 139 mmol/L (ref 135–145)

## 2015-04-17 MED ORDER — ALBUTEROL SULFATE (2.5 MG/3ML) 0.083% IN NEBU: 2.5000 mg | INHALATION_SOLUTION | Freq: Four times a day (QID) | RESPIRATORY_TRACT | Status: DC | PRN

## 2015-04-17 MED ORDER — FLUCONAZOLE 150 MG PO TABS
150.0000 mg | ORAL_TABLET | Freq: Once | ORAL | Status: AC
Start: 2015-04-17 — End: 2015-04-17
  Administered 2015-04-17: 150 mg via ORAL
  Filled 2015-04-17: qty 1

## 2015-04-17 MED ORDER — SENNA 8.6 MG PO TABS
1.0000 | ORAL_TABLET | Freq: Every day | ORAL | Status: DC
  Administered 2015-04-17 – 2015-04-18 (×2): 8.6 mg via ORAL
  Filled 2015-04-17 (×2): qty 1

## 2015-04-17 MED ORDER — METOCLOPRAMIDE HCL 5 MG PO TABS
5.0000 mg | ORAL_TABLET | Freq: Three times a day (TID) | ORAL | Status: DC
  Administered 2015-04-17 – 2015-04-18 (×3): 5 mg via ORAL
  Filled 2015-04-17 (×3): qty 1

## 2015-04-17 MED ORDER — ZOLPIDEM TARTRATE 5 MG PO TABS
5.0000 mg | ORAL_TABLET | Freq: Once | ORAL | Status: AC
  Administered 2015-04-17: 5 mg via ORAL
  Filled 2015-04-17: qty 1

## 2015-04-17 MED ORDER — BISACODYL 10 MG RE SUPP
10.0000 mg | Freq: Every day | RECTAL | Status: DC | PRN
  Administered 2015-04-18: 10 mg via RECTAL
  Filled 2015-04-17: qty 1

## 2015-04-17 MED ORDER — INSULIN GLARGINE 100 UNIT/ML ~~LOC~~ SOLN
10.0000 [IU] | Freq: Every day | SUBCUTANEOUS | Status: DC
  Administered 2015-04-17 – 2015-04-18 (×2): 10 [IU] via SUBCUTANEOUS
  Filled 2015-04-17 (×2): qty 0.1

## 2015-04-17 MED ORDER — INSULIN STARTER KIT- PEN NEEDLES (ENGLISH)
1.0000 | Freq: Once | Status: AC
  Administered 2015-04-17: 1
  Filled 2015-04-17: qty 1

## 2015-04-17 MED ORDER — POLYETHYLENE GLYCOL 3350 17 G PO PACK
17.0000 g | PACK | Freq: Two times a day (BID) | ORAL | Status: DC
  Administered 2015-04-17 – 2015-04-18 (×3): 17 g via ORAL
  Filled 2015-04-17 (×3): qty 1

## 2015-04-17 MED ORDER — PANTOPRAZOLE SODIUM 40 MG IV SOLR
40.0000 mg | Freq: Two times a day (BID) | INTRAVENOUS | Status: DC
  Administered 2015-04-17 – 2015-04-18 (×2): 40 mg via INTRAVENOUS
  Filled 2015-04-17 (×2): qty 40

## 2015-04-17 MED ORDER — POTASSIUM CHLORIDE CRYS ER 20 MEQ PO TBCR
40.0000 meq | EXTENDED_RELEASE_TABLET | Freq: Once | ORAL | Status: AC
  Administered 2015-04-17: 40 meq via ORAL
  Filled 2015-04-17: qty 2

## 2015-04-17 NOTE — Progress Notes (Signed)
[facility] PROGRESS NOTE  ALISSANDRA GEOFFROY JJO:[id number] DOB: 11-May-1960 DOA: 04/15/2015 PCP: Philis Fendt, MD  Assessment/Plan: 1-DKA, Diabetes uncontrolled.  Gap close, bicarb at 27. Continue with Lantus.  SSI.  Hold glipizide, not eating a lot.   2-PNA; CT abdomen showed infiltrates bases. Patient complaining  Of cough. Continue with ceftriaxone and azithromycin.   3-HTN; SBP in the 100. Will hold BP medications.   4-Asthma; continue with nebulizer.   5-Abdominal pain; change protonix to BID IV . CT abdomen negative. Might be related to gastroparesis. Change Reglan to oral. Laxative.  Lipase normal.  Hb stable, no melena.  Change protonix to IV   6-Candida vaginitis; start fluconazole.    Code Status: Full Code.  Family Communication: Care discussed with patient  Disposition Plan: home in 24 to 48 hours.    Consultants:  none  Procedures:  none  Antibiotics:  Ceftriaxone 2-15  Azithromycin 2-15  HPI/Subjective: Still with abdominal pain, epigastric. Complaining of vaginal discharge   Objective: Filed Vitals:   04/17/15 0444 04/17/15 1425  BP: 117/58 126/81  Pulse: 74 68  Temp: 99.5 F (37.5 C) 99.2 F (37.3 C)  Resp: 18 18    Intake/Output Summary (Last 24 hours) at 04/17/15 1613 Last data filed at 04/17/15 0600  Gross per 24 hour  Intake   1800 ml  Output   1700 ml  Net    100 ml   Filed Weights   04/16/15 1216 04/17/15 0444  Weight: 74.844 kg (165 lb) 76.114 kg (167 lb 12.8 oz)    Exam:   General:  NAD  Cardiovascular: S 1, S 2 RRR  Respiratory: CTA  Abdomen: BS present, soft, nt  Musculoskeletal: no edema   Data Reviewed: Basic Metabolic Panel:  Recent Labs Lab 04/15/15 1250 04/15/15 2138 04/16/15 0112 04/16/15 0443 04/17/15 0400  NA 145 145 142 142 139  K 3.2* 3.5 3.8 3.7 3.4*  CL 97* 101 99* 100* 101  CO2 _0 GLUCOSE 275* 150* 156* 156* 94  BUN _1 CREATININE 1.14* 1.03*  0.98 0.98 0.83  CALCIUM 8.7* 8.2* 8.2* 8.2* 7.9*   Liver Function Tests:  Recent Labs Lab 04/15/15 0815  AST 28  ALT 26  ALKPHOS 58  BILITOT 0.9  PROT 8.2*  ALBUMIN 4.6    Recent Labs Lab 04/15/15 0815  LIPASE 35   No results for input(s): AMMONIA in the last 168 hours. CBC:  Recent Labs Lab 04/15/15 0815 04/16/15 0418 04/17/15 0400  WBC 7.6 6.2 5.7  HGB 16.6* 13.3 12.5  HCT 48.4* 41.5 37.6  MCV 92.4 95.8 94.5  PLT 201 166 145*   Cardiac Enzymes: No results for input(s): CKTOTAL, CKMB, CKMBINDEX, TROPONINI in the last 168 hours. BNP (last 3 results) No results for input(s): BNP in the last 8760 hours.  ProBNP (last 3 results) No results for input(s): PROBNP in the last 8760 hours.  CBG:  Recent Labs Lab 04/16/15 1151 04/16/15 1640 04/16/15 2120 04/17/15 0724 04/17/15 1139  GLUCAP 172* 132* 88 133* 123*    No results found for this or any previous visit (from the past 240 hour(s)).   Studies: No results found.  Scheduled Meds: . azithromycin  500 mg Intravenous Q24H  . cefTRIAXone (ROCEPHIN)  IV  1 g Intravenous Q24H  . enoxaparin (LOVENOX) injection  40 mg Subcutaneous Q24H  . gabapentin  300 mg Oral QHS  . insulin aspart  0-9 Units Subcutaneous  TID WC  . insulin aspart  5 Units Intravenous Once  . insulin glargine  10 Units Subcutaneous Daily  . insulin starter kit- pen needles  1 kit Other Once  . metoCLOPramide  5 mg Oral TID AC  . metoprolol  50 mg Oral Daily  . pantoprazole (PROTONIX) IV  40 mg Intravenous Q12H  . polyethylene glycol  17 g Oral BID  . senna  1 tablet Oral Daily  . simvastatin  10 mg Oral QPM  . sodium chloride flush  3 mL Intravenous Q12H   Continuous Infusions: . sodium chloride 100 mL/hr at 04/17/15 1205    Principal Problem:   Diabetic ketoacidosis ([place]) Active Problems:   Diabetes ([place])   Essential hypertension   Asthma   Hyperglycemia   Gastroparesis    Time spent: 25 minutes,.     Niel Hummer A  [facility] Pager [phone]. If 7PM-7AM, please contact night-coverage at [email or web address], password [facility] 04/17/2015, 4:13 PM

## 2015-04-17 NOTE — Plan of Care (Signed)
Problem: Education: Goal: Knowledge of [facility] Education information/materials will improve Outcome: Progressing Reviewed the importance of using the call bell or to use the number on the white board for me or the nurse tech before getting OOB to keep her safe. Patient has 2 siderails up at this time and is able to verbalize how to keep her safe, also talked about hand hygiene and the use of the foam Purell before and after each patient encounter and that her family/friends should also do the same, will continue to monitor.

## 2015-04-17 NOTE — Progress Notes (Signed)
Inpatient Diabetes Program Recommendations  AACE/ADA: New Consensus Statement on Inpatient Glycemic Control (2015)  Target Ranges:  Prepandial:   less than 140 mg/dL      Peak postprandial:   less than 180 mg/dL (1-2 hours)      Critically ill patients:  140 - 180 mg/dL   Results for MANETTE, DOTO (MRN [id number]) as of 04/17/2015 13:54  Ref. Range 04/17/2015 07:24 04/17/2015 11:39  Glucose-Capillary Latest Ref Range: 65-99 mg/dL 782 (H) 956 (H)    Admit with: DKA/ Pneumonia  History: DM  Home DM Meds: Amaryl 4 mg bid       Invokamet 150/500 bid  Current Insulin Orders: Lantus 10 units daily      Novolog Sensitive SSI (0-9 units) TID AC     -Note patient transitioned off IV insulin drip yesterday AM to Lantus and Novolog.  -Lantus reduced to 10 units daily today.  -Spoke with patient about the possibility of starting insulin at home, as I was called by RN to alert me that the Dr. Sunnie Nielsen mentioned that she may send patient home on insulin.  Patient was slightly nervous about starting insulin but is open to using insulin at home if necessary.  Discussed appropriate insulin injection sites, rotation of sites, basal versus bolus insulin, etc.  Also discussed Hypoglycemia symptoms and treatment at home.  -Also discussed with patient that she should not take her Invokamet when she is sick and cannot drink enough fluids at home as this medication can lead to dehydration especially in the setting of illness.  Instructed patient to hold her Invokamet when ill and call her PCP when sick if CBGs running high.  -Educated patient on insulin pen use at home.  Reviewed all steps of insulin pen including attachment of needle, 2-unit air shot, dialing up dose, giving injection, removing needle, disposal of sharps, storage of unused insulin, disposal of insulin etc.  Patient able to provide successful return demonstration.  Reviewed troubleshooting with insulin pen.  Have asked RNs caring for  patient to please allow patient to give all injections here in hospital as much as possible for practice.   -RNs also working with patient on insulin injection education with vial and syringe technique.  Patient not sure whether she would prefer insulin pens or vial and syringe.  Leaning more towards vial and syringe for cost considerations.    MD- If decision made to send patient home on insulin, please ask patient whether she would prefer insulin pens or insulin vial at time of discharge.  If you send patient home on insulin vial, will need Rxs for: Insulin Insulin Syringes  If you send patient home on insulin pens, will need Rxs for: Insulin pens Insulin pen needles      --Will follow patient during hospitalization--  Ambrose Finland RN, MSN, CDE Diabetes Coordinator Inpatient Glycemic Control Team Team Pager: [phone] (8a-5p)

## 2015-04-18 DIAGNOSIS — I10 Essential (primary) hypertension: Secondary | ICD-10-CM | POA: Diagnosis not present

## 2015-04-18 DIAGNOSIS — E860 Dehydration: Secondary | ICD-10-CM | POA: Diagnosis not present

## 2015-04-18 DIAGNOSIS — E1310 Other specified diabetes mellitus with ketoacidosis without coma: Secondary | ICD-10-CM | POA: Diagnosis not present

## 2015-04-18 LAB — GLUCOSE, CAPILLARY: GLUCOSE-CAPILLARY: 122 mg/dL — AB (ref 65–99)

## 2015-04-18 MED ORDER — METOCLOPRAMIDE HCL 5 MG PO TABS: 5.0000 mg | ORAL_TABLET | Freq: Three times a day (TID) | ORAL | Status: DC

## 2015-04-18 MED ORDER — "SYRINGE 21G X 1-1/4"" 3 ML MISC": 1.0000 "application " | Freq: Every day | Status: AC

## 2015-04-18 MED ORDER — BLOOD GLUCOSE MONITOR KIT: PACK | Status: AC

## 2015-04-18 MED ORDER — FLUCONAZOLE 100 MG PO TABS: 100.0000 mg | ORAL_TABLET | Freq: Once | ORAL | Status: DC

## 2015-04-18 MED ORDER — CEPHALEXIN 500 MG PO CAPS: 500.0000 mg | ORAL_CAPSULE | Freq: Four times a day (QID) | ORAL | Status: DC

## 2015-04-18 MED ORDER — INSULIN GLARGINE 100 UNIT/ML ~~LOC~~ SOLN: 10.0000 [IU] | Freq: Every day | SUBCUTANEOUS | Status: DC

## 2015-04-18 MED ORDER — AZITHROMYCIN 500 MG PO TABS: 500.0000 mg | ORAL_TABLET | Freq: Every day | ORAL | Status: DC

## 2015-04-18 MED ORDER — AZITHROMYCIN 250 MG PO TABS
500.0000 mg | ORAL_TABLET | Freq: Every day | ORAL | Status: DC
  Administered 2015-04-18: 500 mg via ORAL
  Filled 2015-04-18: qty 2

## 2015-04-18 NOTE — Plan of Care (Signed)
Problem: Physical Regulation: Goal: Will regain or maintain usual level of consciousness Outcome: Progressing Patient is more pleasant and cooperative and is able to keep her pain level more controlled, will continue to monitor

## 2015-04-18 NOTE — Plan of Care (Signed)
Problem: Education: Goal: Knowledge of disease or condition will improve Outcome: Progressing Patient has a lot of material and was able to view some videos re: Living with Diabetes. Patient will need to make some changes and has already addressed issues with her family and will need to sit down with her boss and address her need to eat small snacks often to keep her blood sugars more regulated, will continue to work with her.

## 2015-04-18 NOTE — Progress Notes (Signed)
Ptient discharge paperwork gone over in detail with patient. All questions answered to patient satisfaction. Extensive education on DM given to patient as well as many Insurance underwriter. All new medications gone over in detail. Patient demonstrated proper insulin self administration. Patient IV discontinued intact. Telemetry discontinued for discharge. Patient discharged to home with family by way of wheelchair.

## 2015-04-18 NOTE — Progress Notes (Signed)
PHARMACIST - PHYSICIAN COMMUNICATION  CONCERNING: Antibiotic IV to Oral Route Change Policy  RECOMMENDATION: This patient is receiving azithromycin by the intravenous route.  Based on criteria approved by the Pharmacy and Therapeutics Committee, the antibiotic(s) is/are being converted to the equivalent oral dose form(s).   DESCRIPTION: These criteria include:  Patient being treated for a respiratory tract infection, urinary tract infection, cellulitis or clostridium difficile associated diarrhea if on metronidazole  The patient is not neutropenic and does not exhibit a GI malabsorption state  The patient is eating (either orally or via tube) and/or has been taking other orally administered medications for a least 24 hours  The patient is improving clinically and has a Tmax < 100.5  If you have questions about this conversion, please contact the Pharmacy Department    [phone] )  [facility]   [phone] )  [facility]   [phone] )  [facility]   [phone] )  [facility]   [phone] )  [facility]   Harland German, [place] D 04/18/2015 8:48 AM

## 2015-04-18 NOTE — Plan of Care (Signed)
Problem: Nutritional: Goal: Maintenance of adequate nutrition will improve Outcome: Progressing Slowly increasing her intake of solid foods at this time

## 2015-04-18 NOTE — Progress Notes (Signed)
Updated report received via Gwen RN in patient's room using SBAR format, VS, new orders, meds and today's events reviewed, assumed care of patient.

## 2015-04-18 NOTE — Plan of Care (Signed)
Problem: Nutritional: Goal: Maintenance of adequate nutrition will improve Outcome: Progressing Starting to be able to keep solid food down and is maintaining good intake and now starting to improve output

## 2015-04-18 NOTE — Plan of Care (Signed)
Problem: Metabolic: Goal: Ability to maintain appropriate glucose levels will improve Outcome: Completed/Met Date Met:  04/18/15 Blood sugars have been 90-150's for at least the last 24 hours and labs are starting to improve

## 2015-04-18 NOTE — Plan of Care (Signed)
Problem: Cardiac: Goal: Ability to maintain an adequate cardiac output will improve Outcome: Progressing Gave handout and instructions for diabetes videos and reviewed some diet, meal planning with frequent healthy snacks, exercise, s/s of hyper/hypoglycemia and patient was able to verbalize some ideas to help her to maintain healthy blood sugars, also was instructed and gave herself an insulin injection on the daylight shift, will need to review prior to discharge

## 2015-04-18 NOTE — Plan of Care (Signed)
Problem: Fluid Volume: Goal: Ability to achieve a balanced intake and output will improve Outcome: Progressing Patient has an IV of NS running at 100cc/hr and is now able to keep fluids and solid foods down without nausea or vomiting and is starting to improve her output.
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
## 2015-04-23 ENCOUNTER — Ambulatory Visit: Payer: BLUE CROSS/BLUE SHIELD | Attending: Family Medicine | Admitting: Family Medicine

## 2015-04-23 VITALS — BP 152/90 | HR 69 | Temp 98.3°F | Resp 16 | Ht 65.0 in | Wt 166.0 lb

## 2015-04-23 DIAGNOSIS — R6881 Early satiety: Secondary | ICD-10-CM | POA: Insufficient documentation

## 2015-04-23 DIAGNOSIS — J189 Pneumonia, unspecified organism: Secondary | ICD-10-CM | POA: Diagnosis not present

## 2015-04-23 DIAGNOSIS — E119 Type 2 diabetes mellitus without complications: Secondary | ICD-10-CM | POA: Diagnosis not present

## 2015-04-23 DIAGNOSIS — R109 Unspecified abdominal pain: Secondary | ICD-10-CM | POA: Diagnosis not present

## 2015-04-23 DIAGNOSIS — K3184 Gastroparesis: Secondary | ICD-10-CM | POA: Diagnosis not present

## 2015-04-23 MED ORDER — METOCLOPRAMIDE HCL 5 MG PO TABS: 5.0000 mg | ORAL_TABLET | Freq: Three times a day (TID) | ORAL | Status: DC

## 2015-04-23 MED ORDER — INSULIN GLARGINE 100 UNIT/ML ~~LOC~~ SOLN: 14.0000 [IU] | Freq: Every day | SUBCUTANEOUS | Status: DC

## 2015-04-23 NOTE — Patient Instructions (Signed)
It was a pleasure to see you today.    FMLA form completed and returned to you.   Regarding the gastroparesis, REGLAN (metaclopramide)  tablets, take 1 tablet by mouth 3 times a day with meals.   GI evaluation.   For the diabetes, we are increasing your LANTUS insulin to 14 units at bedtime.  Keep checking your home glucose readings and bring your log with you.  Follow up next week for abdominal pain, response to the Reglan, and glucose readings.   FOLLOW UP [facility] THE WEEK OF FEB 27TH.

## 2015-04-23 NOTE — Progress Notes (Signed)
Patient's here for hospital f/up for hyperglycemia.   Patient need medical clearance from [facility] stating that she's better. She's requesting medical clearance so lawyer can start paying her medical bills.  Patient had flu shot.
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
## 2015-04-24 ENCOUNTER — Encounter (HOSPITAL_COMMUNITY): Payer: Self-pay | Admitting: *Deleted

## 2015-04-24 ENCOUNTER — Emergency Department (HOSPITAL_COMMUNITY)
Admission: EM | Admit: 2015-04-24 | Discharge: 2015-04-24 | Disposition: A | Discharge status: Home / Self Care | Payer: BLUE CROSS/BLUE SHIELD | Attending: Emergency Medicine | Admitting: Emergency Medicine

## 2015-04-24 ENCOUNTER — Encounter: Payer: Self-pay | Admitting: Clinical

## 2015-04-24 ENCOUNTER — Encounter: Payer: Self-pay | Admitting: Gastroenterology

## 2015-04-24 DIAGNOSIS — K219 Gastro-esophageal reflux disease without esophagitis: Secondary | ICD-10-CM | POA: Diagnosis not present

## 2015-04-24 DIAGNOSIS — Z794 Long term (current) use of insulin: Secondary | ICD-10-CM | POA: Diagnosis not present

## 2015-04-24 DIAGNOSIS — I10 Essential (primary) hypertension: Secondary | ICD-10-CM | POA: Diagnosis not present

## 2015-04-24 DIAGNOSIS — K859 Acute pancreatitis without necrosis or infection, unspecified: Secondary | ICD-10-CM | POA: Diagnosis not present

## 2015-04-24 DIAGNOSIS — J45909 Unspecified asthma, uncomplicated: Secondary | ICD-10-CM | POA: Diagnosis not present

## 2015-04-24 DIAGNOSIS — E119 Type 2 diabetes mellitus without complications: Secondary | ICD-10-CM | POA: Diagnosis not present

## 2015-04-24 DIAGNOSIS — Z87891 Personal history of nicotine dependence: Secondary | ICD-10-CM | POA: Diagnosis not present

## 2015-04-24 DIAGNOSIS — Z792 Long term (current) use of antibiotics: Secondary | ICD-10-CM | POA: Insufficient documentation

## 2015-04-24 DIAGNOSIS — Z7984 Long term (current) use of oral hypoglycemic drugs: Secondary | ICD-10-CM | POA: Diagnosis not present

## 2015-04-24 DIAGNOSIS — R109 Unspecified abdominal pain: Secondary | ICD-10-CM | POA: Diagnosis present

## 2015-04-24 DIAGNOSIS — Z79899 Other long term (current) drug therapy: Secondary | ICD-10-CM | POA: Diagnosis not present

## 2015-04-24 LAB — URINALYSIS, ROUTINE W REFLEX MICROSCOPIC
Bilirubin Urine: NEGATIVE
Glucose, UA: NEGATIVE mg/dL
Hgb urine dipstick: NEGATIVE
KETONES UR: 15 mg/dL — AB
LEUKOCYTES UA: NEGATIVE
NITRITE: NEGATIVE
PH: 8 (ref 5.0–8.0)
PROTEIN: 100 mg/dL — AB
Specific Gravity, Urine: 1.026 (ref 1.005–1.030)

## 2015-04-24 LAB — COMPREHENSIVE METABOLIC PANEL
ALBUMIN: 3.9 g/dL (ref 3.5–5.0)
ALT: 40 U/L (ref 14–54)
ANION GAP: 9 (ref 5–15)
AST: 26 U/L (ref 15–41)
Alkaline Phosphatase: 53 U/L (ref 38–126)
BUN: 11 mg/dL (ref 6–20)
CHLORIDE: 101 mmol/L (ref 101–111)
CO2: 30 mmol/L (ref 22–32)
Calcium: 9.5 mg/dL (ref 8.9–10.3)
Creatinine, Ser: 0.88 mg/dL (ref 0.44–1.00)
GFR calc non Af Amer: >60 mL/min (ref >60)
GLUCOSE: 235 mg/dL — AB (ref 65–99)
Potassium: 4.1 mmol/L (ref 3.5–5.1)
SODIUM: 140 mmol/L (ref 135–145)
Total Bilirubin: 0.5 mg/dL (ref 0.3–1.2)
Total Protein: 7.1 g/dL (ref 6.5–8.1)

## 2015-04-24 LAB — URINE MICROSCOPIC-ADD ON
RBC / HPF: NONE SEEN RBC/hpf (ref 0–5)
WBC, UA: NONE SEEN WBC/hpf (ref 0–5)

## 2015-04-24 LAB — CBC
HEMATOCRIT: 43.5 % (ref 36.0–46.0)
Hemoglobin: 14.9 g/dL (ref 12.0–15.0)
MCH: 31.9 pg (ref 26.0–34.0)
MCHC: 34.3 g/dL (ref 30.0–36.0)
MCV: 93.1 fL (ref 78.0–100.0)
PLATELETS: 325 10*3/uL (ref 150–400)
RBC: 4.67 MIL/uL (ref 3.87–5.11)
RDW: 12.6 % (ref 11.5–15.5)
WBC: 6.8 10*3/uL (ref 4.0–10.5)

## 2015-04-24 LAB — LIPASE, BLOOD: LIPASE: 207 U/L — AB (ref 11–51)

## 2015-04-24 MED ORDER — MORPHINE SULFATE (PF) 4 MG/ML IV SOLN
4.0000 mg | Freq: Once | INTRAVENOUS | Status: AC
  Administered 2015-04-24: 4 mg via INTRAVENOUS
  Filled 2015-04-24: qty 1

## 2015-04-24 MED ORDER — HYDROCODONE-ACETAMINOPHEN 5-325 MG PO TABS: 2.0000 | ORAL_TABLET | ORAL | Status: DC | PRN

## 2015-04-24 MED ORDER — ONDANSETRON HCL 4 MG/2ML IJ SOLN
4.0000 mg | Freq: Once | INTRAMUSCULAR | Status: AC
  Administered 2015-04-24: 4 mg via INTRAVENOUS
  Filled 2015-04-24: qty 2

## 2015-04-24 MED ORDER — METOCLOPRAMIDE HCL 5 MG/ML IJ SOLN
5.0000 mg | Freq: Once | INTRAMUSCULAR | Status: AC
  Administered 2015-04-24: 5 mg via INTRAVENOUS
  Filled 2015-04-24: qty 2

## 2015-04-24 MED ORDER — ONDANSETRON HCL 4 MG PO TABS: 4.0000 mg | ORAL_TABLET | Freq: Four times a day (QID) | ORAL | Status: DC

## 2015-04-24 MED ORDER — SODIUM CHLORIDE 0.9 % IV BOLUS (SEPSIS)
1000.0000 mL | Freq: Once | INTRAVENOUS | Status: AC
  Administered 2015-04-24: 1000 mL via INTRAVENOUS

## 2015-04-24 NOTE — ED Notes (Signed)
Pt reports just being discharged from hospital on Saturday. Was admitted for gastroparesis, DKA and pneumonia. Reports having abd pain since being discharged, she eats and feels like her food is not digesting and then has n/v. Went to pcp yesterday and referred to GI dr but unable to get appt for weeks.
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
## 2015-04-24 NOTE — Progress Notes (Signed)
Depression screen [facility] 2/9 04/23/2015 04/07/2015 04/07/2015  Decreased Interest 0 0 0  Down, Depressed, Hopeless 0 0 0  PHQ - 2 Score 0 0 0   04-23-15 PHQ9: 5 (0,0,2,0,3,0,0,0,0)  GAD 7 : Generalized Anxiety Score 04/23/2015 04/07/2015  Nervous, Anxious, on Edge 0 2  Control/stop worrying 0 0  Worry too much - different things 0 0  Trouble relaxing 0 2  Restless 0 2  Easily annoyed or irritable 0 0  Afraid - awful might happen 0 0  Total GAD 7 Score 0 6

## 2015-04-24 NOTE — Discharge Instructions (Signed)

## 2015-04-27 ENCOUNTER — Emergency Department (HOSPITAL_COMMUNITY)
Admission: EM | Admit: 2015-04-27 | Discharge: 2015-04-27 | Disposition: A | Discharge status: Home / Self Care | Payer: BLUE CROSS/BLUE SHIELD | Attending: Emergency Medicine | Admitting: Emergency Medicine

## 2015-04-27 ENCOUNTER — Telehealth: Payer: Self-pay | Admitting: Family Medicine

## 2015-04-27 ENCOUNTER — Emergency Department (HOSPITAL_COMMUNITY)
Admission: EM | Admit: 2015-04-27 | Discharge: 2015-04-27 | Disposition: A | Discharge status: Nursing Home (Includes ICF) | Payer: BLUE CROSS/BLUE SHIELD | Source: Home / Self Care | Attending: Family Medicine | Admitting: Family Medicine

## 2015-04-27 ENCOUNTER — Other Ambulatory Visit: Payer: Self-pay

## 2015-04-27 ENCOUNTER — Encounter (HOSPITAL_COMMUNITY): Payer: Self-pay | Admitting: Emergency Medicine

## 2015-04-27 ENCOUNTER — Encounter (HOSPITAL_COMMUNITY): Payer: Self-pay | Admitting: *Deleted

## 2015-04-27 ENCOUNTER — Other Ambulatory Visit: Payer: Self-pay | Admitting: Orthopaedic Surgery

## 2015-04-27 DIAGNOSIS — Z79899 Other long term (current) drug therapy: Secondary | ICD-10-CM | POA: Insufficient documentation

## 2015-04-27 DIAGNOSIS — R5381 Other malaise: Secondary | ICD-10-CM

## 2015-04-27 DIAGNOSIS — R1013 Epigastric pain: Secondary | ICD-10-CM | POA: Diagnosis not present

## 2015-04-27 DIAGNOSIS — Z792 Long term (current) use of antibiotics: Secondary | ICD-10-CM | POA: Insufficient documentation

## 2015-04-27 DIAGNOSIS — E119 Type 2 diabetes mellitus without complications: Secondary | ICD-10-CM | POA: Insufficient documentation

## 2015-04-27 DIAGNOSIS — J45909 Unspecified asthma, uncomplicated: Secondary | ICD-10-CM | POA: Diagnosis not present

## 2015-04-27 DIAGNOSIS — N6320 Unspecified lump in the left breast, unspecified quadrant: Secondary | ICD-10-CM

## 2015-04-27 DIAGNOSIS — R112 Nausea with vomiting, unspecified: Secondary | ICD-10-CM | POA: Insufficient documentation

## 2015-04-27 DIAGNOSIS — I10 Essential (primary) hypertension: Secondary | ICD-10-CM | POA: Insufficient documentation

## 2015-04-27 DIAGNOSIS — R109 Unspecified abdominal pain: Secondary | ICD-10-CM

## 2015-04-27 DIAGNOSIS — Z794 Long term (current) use of insulin: Secondary | ICD-10-CM | POA: Insufficient documentation

## 2015-04-27 DIAGNOSIS — K219 Gastro-esophageal reflux disease without esophagitis: Secondary | ICD-10-CM | POA: Diagnosis not present

## 2015-04-27 DIAGNOSIS — Z7984 Long term (current) use of oral hypoglycemic drugs: Secondary | ICD-10-CM | POA: Diagnosis not present

## 2015-04-27 DIAGNOSIS — Z87891 Personal history of nicotine dependence: Secondary | ICD-10-CM | POA: Diagnosis not present

## 2015-04-27 HISTORY — DX: Gastroparesis: K31.84

## 2015-04-27 LAB — CBC
HEMATOCRIT: 44.4 % (ref 36.0–46.0)
HEMOGLOBIN: 15.1 g/dL — AB (ref 12.0–15.0)
MCH: 31.2 pg (ref 26.0–34.0)
MCHC: 34 g/dL (ref 30.0–36.0)
MCV: 91.7 fL (ref 78.0–100.0)
Platelets: 315 10*3/uL (ref 150–400)
RBC: 4.84 MIL/uL (ref 3.87–5.11)
RDW: 12.5 % (ref 11.5–15.5)
WBC: 5.9 10*3/uL (ref 4.0–10.5)

## 2015-04-27 LAB — URINALYSIS, ROUTINE W REFLEX MICROSCOPIC
BILIRUBIN URINE: NEGATIVE
GLUCOSE, UA: NEGATIVE mg/dL
KETONES UR: 15 mg/dL — AB
NITRITE: NEGATIVE
PH: 5.5 (ref 5.0–8.0)
Protein, ur: NEGATIVE mg/dL
SPECIFIC GRAVITY, URINE: 1.022 (ref 1.005–1.030)

## 2015-04-27 LAB — COMPREHENSIVE METABOLIC PANEL
ALBUMIN: 4 g/dL (ref 3.5–5.0)
ALK PHOS: 47 U/L (ref 38–126)
ALT: 27 U/L (ref 14–54)
ANION GAP: 13 (ref 5–15)
AST: 26 U/L (ref 15–41)
BILIRUBIN TOTAL: 0.8 mg/dL (ref 0.3–1.2)
BUN: 7 mg/dL (ref 6–20)
CALCIUM: 9.9 mg/dL (ref 8.9–10.3)
CO2: 27 mmol/L (ref 22–32)
Chloride: 98 mmol/L — ABNORMAL LOW (ref 101–111)
Creatinine, Ser: 0.81 mg/dL (ref 0.44–1.00)
GLUCOSE: 94 mg/dL (ref 65–99)
POTASSIUM: 3.5 mmol/L (ref 3.5–5.1)
Sodium: 138 mmol/L (ref 135–145)
TOTAL PROTEIN: 7.3 g/dL (ref 6.5–8.1)

## 2015-04-27 LAB — URINE MICROSCOPIC-ADD ON

## 2015-04-27 LAB — LIPASE, BLOOD: Lipase: 36 U/L (ref 11–51)

## 2015-04-27 MED ORDER — METOCLOPRAMIDE HCL 10 MG PO TABS: 10.0000 mg | ORAL_TABLET | Freq: Three times a day (TID) | ORAL | Status: DC | PRN

## 2015-04-27 MED ORDER — METOCLOPRAMIDE HCL 10 MG PO TABS
10.0000 mg | ORAL_TABLET | Freq: Once | ORAL | Status: AC
  Administered 2015-04-27: 10 mg via ORAL
  Filled 2015-04-27: qty 1

## 2015-04-27 NOTE — ED Notes (Signed)
Pt sts mid abd pain and N/V; pt sts hx of gastroparesis; pt seen multiple times for same

## 2015-04-27 NOTE — Discharge Instructions (Signed)
Your testing was reassuring - you should take the reglan 3 times a day for the next week- see the GI doctor in 1 week.  Please obtain all of your results from medical records or have your doctors office obtain the results - share them with your doctor - you should be seen at your doctors office in the next 2 days. Call today to arrange your follow up. Take the medications as prescribed. Please review all of the medicines and only take them if you do not have an allergy to them. Please be aware that if you are taking birth control pills, taking other prescriptions, ESPECIALLY ANTIBIOTICS may make the birth control ineffective - if this is the case, either do not engage in sexual activity or use alternative methods of birth control such as condoms until you have finished the medicine and your family doctor says it is OK to restart them. If you are on a blood thinner such as COUMADIN, be aware that any other medicine that you take may cause the coumadin to either work too much, or not enough - you should have your coumadin level rechecked in next 7 days if this is the case.  ?  It is also a possibility that you have an allergic reaction to any of the medicines that you have been prescribed - Everybody reacts differently to medications and while MOST people have no trouble with most medicines, you may have a reaction such as nausea, vomiting, rash, swelling, shortness of breath. If this is the case, please stop taking the medicine immediately and contact your physician.  ?  You should return to the ER if you develop severe or worsening symptoms.

## 2015-04-27 NOTE — ED Notes (Signed)
Pt stable, ambulatory, states understanding of discharge instructions 

## 2015-04-27 NOTE — ED Notes (Signed)
Pt  Seen  By pcp   4  Days  Ago  And  Again  In  The  Er  3  Days  Ago     For  abd pain   Nausea   And  Constipation      pt    Reports    The  Pain is  intermittant     She  Is  Sitting  Upright  On  The  Exam table  Speaking in  Complete  sentances

## 2015-04-27 NOTE — Telephone Encounter (Signed)
Pt. Called stating she has two knots in her breast and the provider stated she needed a mammogram. Pt. Called to make an appointment and she was told she needed a referral. Please f/u with pt.
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
## 2015-04-29 ENCOUNTER — Encounter (HOSPITAL_COMMUNITY): Payer: Self-pay | Admitting: Emergency Medicine

## 2015-04-29 ENCOUNTER — Emergency Department (HOSPITAL_COMMUNITY)
Admission: EM | Admit: 2015-04-29 | Discharge: 2015-04-30 | Disposition: A | Discharge status: Home / Self Care | Payer: BLUE CROSS/BLUE SHIELD | Attending: Emergency Medicine | Admitting: Emergency Medicine

## 2015-04-29 DIAGNOSIS — Z7984 Long term (current) use of oral hypoglycemic drugs: Secondary | ICD-10-CM | POA: Insufficient documentation

## 2015-04-29 DIAGNOSIS — Z794 Long term (current) use of insulin: Secondary | ICD-10-CM | POA: Diagnosis not present

## 2015-04-29 DIAGNOSIS — Z3202 Encounter for pregnancy test, result negative: Secondary | ICD-10-CM | POA: Diagnosis not present

## 2015-04-29 DIAGNOSIS — J45909 Unspecified asthma, uncomplicated: Secondary | ICD-10-CM | POA: Diagnosis not present

## 2015-04-29 DIAGNOSIS — N390 Urinary tract infection, site not specified: Secondary | ICD-10-CM | POA: Diagnosis not present

## 2015-04-29 DIAGNOSIS — Z79899 Other long term (current) drug therapy: Secondary | ICD-10-CM | POA: Insufficient documentation

## 2015-04-29 DIAGNOSIS — Z87891 Personal history of nicotine dependence: Secondary | ICD-10-CM | POA: Insufficient documentation

## 2015-04-29 DIAGNOSIS — E119 Type 2 diabetes mellitus without complications: Secondary | ICD-10-CM | POA: Insufficient documentation

## 2015-04-29 DIAGNOSIS — R1013 Epigastric pain: Secondary | ICD-10-CM | POA: Diagnosis present

## 2015-04-29 DIAGNOSIS — K219 Gastro-esophageal reflux disease without esophagitis: Secondary | ICD-10-CM | POA: Diagnosis not present

## 2015-04-29 DIAGNOSIS — I10 Essential (primary) hypertension: Secondary | ICD-10-CM | POA: Insufficient documentation

## 2015-04-29 LAB — URINALYSIS, ROUTINE W REFLEX MICROSCOPIC
Glucose, UA: NEGATIVE mg/dL
KETONES UR: 15 mg/dL — AB
Nitrite: POSITIVE — AB
PH: 6.5 (ref 5.0–8.0)
Protein, ur: 100 mg/dL — AB
SPECIFIC GRAVITY, URINE: 1.04 — AB (ref 1.005–1.030)

## 2015-04-29 LAB — CBC
HEMATOCRIT: 45.7 % (ref 36.0–46.0)
HEMOGLOBIN: 15.2 g/dL — AB (ref 12.0–15.0)
MCH: 31.3 pg (ref 26.0–34.0)
MCHC: 33.3 g/dL (ref 30.0–36.0)
MCV: 94 fL (ref 78.0–100.0)
Platelets: 354 10*3/uL (ref 150–400)
RBC: 4.86 MIL/uL (ref 3.87–5.11)
RDW: 12.5 % (ref 11.5–15.5)
WBC: 7.4 10*3/uL (ref 4.0–10.5)

## 2015-04-29 LAB — COMPREHENSIVE METABOLIC PANEL
ALT: 19 U/L (ref 14–54)
ANION GAP: 12 (ref 5–15)
AST: 21 U/L (ref 15–41)
Albumin: 4.4 g/dL (ref 3.5–5.0)
Alkaline Phosphatase: 49 U/L (ref 38–126)
BILIRUBIN TOTAL: 1.4 mg/dL — AB (ref 0.3–1.2)
BUN: 13 mg/dL (ref 6–20)
CALCIUM: 9.9 mg/dL (ref 8.9–10.3)
CO2: 33 mmol/L — ABNORMAL HIGH (ref 22–32)
Chloride: 93 mmol/L — ABNORMAL LOW (ref 101–111)
Creatinine, Ser: 0.99 mg/dL (ref 0.44–1.00)
Glucose, Bld: 145 mg/dL — ABNORMAL HIGH (ref 65–99)
POTASSIUM: 3.2 mmol/L — AB (ref 3.5–5.1)
Sodium: 138 mmol/L (ref 135–145)
TOTAL PROTEIN: 7.9 g/dL (ref 6.5–8.1)

## 2015-04-29 LAB — URINE MICROSCOPIC-ADD ON

## 2015-04-29 LAB — I-STAT BETA HCG BLOOD, ED (MC, WL, AP ONLY): HCG, QUANTITATIVE: 5.4 m[IU]/mL — AB (ref <5)

## 2015-04-29 LAB — CBG MONITORING, ED: GLUCOSE-CAPILLARY: 135 mg/dL — AB (ref 65–99)

## 2015-04-29 LAB — LIPASE, BLOOD: Lipase: 47 U/L (ref 11–51)

## 2015-04-29 MED ORDER — ONDANSETRON HCL 4 MG/2ML IJ SOLN
4.0000 mg | Freq: Once | INTRAMUSCULAR | Status: AC
  Administered 2015-04-29: 4 mg via INTRAVENOUS
  Filled 2015-04-29: qty 2

## 2015-04-29 MED ORDER — SODIUM CHLORIDE 0.9 % IV BOLUS (SEPSIS)
1000.0000 mL | Freq: Once | INTRAVENOUS | Status: AC
  Administered 2015-04-29: 1000 mL via INTRAVENOUS

## 2015-04-29 NOTE — ED Notes (Signed)
Patient unable to urinate at this time. 

## 2015-04-29 NOTE — ED Notes (Signed)
Pt states that she has been dx with gastroparesis at [facility] and was given medication for that but still cannot keep any food down. Made appt with GI but it is not until the end of the month. Alert and oriented.
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
## 2015-04-30 ENCOUNTER — Telehealth: Payer: Self-pay | Admitting: *Deleted

## 2015-04-30 DIAGNOSIS — N6320 Unspecified lump in the left breast, unspecified quadrant: Secondary | ICD-10-CM | POA: Insufficient documentation

## 2015-04-30 MED ORDER — CEPHALEXIN 500 MG PO CAPS
1000.0000 mg | ORAL_CAPSULE | Freq: Once | ORAL | Status: AC
  Administered 2015-04-30: 1000 mg via ORAL
  Filled 2015-04-30: qty 2

## 2015-04-30 MED ORDER — ONDANSETRON HCL 4 MG PO TABS: 4.0000 mg | ORAL_TABLET | Freq: Four times a day (QID) | ORAL | Status: DC

## 2015-04-30 MED ORDER — CEPHALEXIN 500 MG PO CAPS: 500.0000 mg | ORAL_CAPSULE | Freq: Two times a day (BID) | ORAL | Status: DC

## 2015-04-30 MED ORDER — FENTANYL CITRATE (PF) 100 MCG/2ML IJ SOLN
50.0000 ug | Freq: Once | INTRAMUSCULAR | Status: AC
  Administered 2015-04-30: 50 ug via INTRAVENOUS
  Filled 2015-04-30: qty 2

## 2015-04-30 NOTE — Discharge Instructions (Signed)
You were evaluated in the ED today for abdominal discomfort. He was found to have a urinary tract infection. He will be treated for this with antibiotics. Please take all of your antibiotics as prescribed, do not state or share them. Use your antinausea medicine to help. Follow-up with your doctor as needed for reevaluation. Be sure to go to your GI appointment. Return to ED for any new or worsening symptoms such as fever, persistent vomiting, back pain or other concerning symptoms.  Antibiotic Medicine Antibiotic medicines are used to treat infections caused by bacteria. They work by injuring or killing the bacteria that is making you sick. HOW IS AN ANTIBIOTIC CHOSEN? An antibiotic is chosen based on many factors. To help your health care provider choose one for you, tell your health care provider if:  You have any allergies.  You are pregnant or plan to get pregnant.  You are breastfeeding.  You are taking any medicines. These include over-the-counter medicines, prescription medicines, and herbal remedies.  You have a medical condition or problem you have not already discussed. Your health care provider will also consider:  How often the medicine has to be taken.  Common side effects of the medicine.  The cost of the medicine.  The taste of the medicine. If you have questions about why an antibiotic was chosen, make sure to ask. FOR HOW LONG SHOULD I TAKE MY ANTIBIOTIC? Continue to take your antibiotic for as long as told by your health care provider. Do not stop taking it when you feel better. If you stop taking it too soon:  You may start to feel sick again.  Your infection may become harder to treat.  Complications may develop. WHAT IF I MISS A DOSE? Try not to miss any doses of medicine. If you miss a dose, take it as soon as possible. However, if it is almost time for the next dose:  If you are taking 2 doses per day, take the missed dose and the next dose 5 to 6 hours  apart.  If you are taking 3 or more doses per day, take the missed dose and the next dose 2 to 4 hours apart, then go back to the normal schedule. If you cannot make up a missed dose, take the next scheduled dose on time. Then take the missed dose after you have taken all the doses as recommended by your health care provider, as if you had one more dose left. DO ANTIBIOTICS AFFECT BIRTH CONTROL? Birth control pills may not work while you are on antibiotics. If you are taking birth control pills, continue taking them as usual and use a second form of birth control, such as a condom, to avoid unwanted pregnancy. Continue using the second form of birth control until you are finished with your current 1 month cycle of birth control pills. OTHER INFORMATION  If there is any medicine left over, throw it away.  Never take someone else's antibiotics.  Never take leftover antibiotics. SEEK MEDICAL CARE IF:  You get worse.  You do not feel better within a few days of starting the antibiotic medicine.  You vomit.  White patches appear in your mouth.  You have new joint pain that begins after starting the antibiotic.  You have new muscle aches that begin after starting the antibiotic.  You had a fever before starting the antibiotic and it returns.  You have any symptoms of an allergic reaction, such as an itchy rash. If this happens, stop  taking the antibiotic. SEEK IMMEDIATE MEDICAL CARE IF:  Your urine turns dark or becomes blood-colored.  Your skin turns yellow.  You bruise or bleed easily.  You have severe diarrhea and abdominal cramps.  You have a severe headache.  You have signs of a severe allergic reaction, such as:  Trouble breathing.  Wheezing.  Swelling of the lips, tongue, or face.  Fainting.  Blisters on the skin or in the mouth. If you have signs of a severe allergic reaction, stop taking the antibiotic right away.   This information is not intended to replace  advice given to you by your health care provider. Make sure you discuss any questions you have with your health care provider.   Document Released: 10/28/2003 Document Revised: 11/05/2014 Document Reviewed: 07/02/2014 Elsevier Interactive Patient Education 2016 [employer]  Urinary Tract Infection A urinary tract infection (UTI) can occur any place along the urinary tract. The tract includes the kidneys, ureters, bladder, and urethra. A type of germ called bacteria often causes a UTI. UTIs are often helped with antibiotic medicine.  HOME CARE   If given, take antibiotics as told by your doctor. Finish them even if you start to feel better.  Drink enough fluids to keep your pee (urine) clear or pale yellow.  Avoid tea, drinks with caffeine, and bubbly (carbonated) drinks.  Pee often. Avoid holding your pee in for a long time.  Pee before and after having sex (intercourse).  Wipe from front to back after you poop (bowel movement) if you are a woman. Use each tissue only once. GET HELP RIGHT AWAY IF:   You have back pain.  You have lower belly (abdominal) pain.  You have chills.  You feel sick to your stomach (nauseous).  You throw up (vomit).  Your burning or discomfort with peeing does not go away.  You have a fever.  Your symptoms are not better in 3 days. MAKE SURE YOU:   Understand these instructions.  Will watch your condition.  Will get help right away if you are not doing well or get worse.   This information is not intended to replace advice given to you by your health care provider. Make sure you discuss any questions you have with your health care provider.   Document Released: 08/03/2007 Document Revised: 03/07/2014 Document Reviewed: 09/15/2011 Elsevier Interactive Patient Education [employer]

## 2015-04-30 NOTE — Telephone Encounter (Signed)
Patient verified DOB Patient was informed of Diagnostic being ordered. Patient was given the contact number [phone].  Patient mentioned paperwork being faxed to the office regarding her LOA for work. MA informed patient of "keeping an eye out" for the paperwork with a 7 day turn around. Patient expressed her understanding and had no further questions at this time.
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
## 2015-04-30 NOTE — Telephone Encounter (Signed)
MA spoke with patient and provided patient with GI contact number.

## 2015-05-01 ENCOUNTER — Other Ambulatory Visit: Payer: Self-pay | Admitting: *Deleted

## 2015-05-01 DIAGNOSIS — N6002 Solitary cyst of left breast: Secondary | ICD-10-CM

## 2015-05-01 LAB — URINE CULTURE

## 2015-05-01 NOTE — Progress Notes (Signed)
[facility]ast center called stating the previously entered orders were entered incorrectly.  [facility]ast Center staff gave RN order codes.  Orders reentered.

## 2015-05-06 ENCOUNTER — Telehealth: Payer: Self-pay | Admitting: Family Medicine

## 2015-05-06 ENCOUNTER — Ambulatory Visit: Payer: BLUE CROSS/BLUE SHIELD | Attending: Family Medicine | Admitting: Family Medicine

## 2015-05-06 ENCOUNTER — Encounter: Payer: Self-pay | Admitting: Family Medicine

## 2015-05-06 VITALS — BP 123/80 | HR 77 | Temp 98.1°F | Resp 15 | Ht 65.0 in | Wt 165.0 lb

## 2015-05-06 DIAGNOSIS — K3184 Gastroparesis: Secondary | ICD-10-CM

## 2015-05-06 DIAGNOSIS — Z79899 Other long term (current) drug therapy: Secondary | ICD-10-CM | POA: Insufficient documentation

## 2015-05-06 DIAGNOSIS — I10 Essential (primary) hypertension: Secondary | ICD-10-CM | POA: Diagnosis not present

## 2015-05-06 DIAGNOSIS — Z794 Long term (current) use of insulin: Secondary | ICD-10-CM | POA: Diagnosis not present

## 2015-05-06 DIAGNOSIS — F411 Generalized anxiety disorder: Secondary | ICD-10-CM

## 2015-05-06 DIAGNOSIS — E1165 Type 2 diabetes mellitus with hyperglycemia: Secondary | ICD-10-CM | POA: Insufficient documentation

## 2015-05-06 DIAGNOSIS — J4520 Mild intermittent asthma, uncomplicated: Secondary | ICD-10-CM | POA: Insufficient documentation

## 2015-05-06 DIAGNOSIS — E876 Hypokalemia: Secondary | ICD-10-CM | POA: Insufficient documentation

## 2015-05-06 LAB — BASIC METABOLIC PANEL
BUN: 17 mg/dL (ref 7–25)
CHLORIDE: 96 mmol/L — AB (ref 98–110)
CO2: 28 mmol/L (ref 20–31)
Calcium: 9.4 mg/dL (ref 8.6–10.4)
Creat: 0.87 mg/dL (ref 0.50–1.05)
GLUCOSE: 291 mg/dL — AB (ref 65–99)
POTASSIUM: 4.2 mmol/L (ref 3.5–5.3)
SODIUM: 135 mmol/L (ref 135–146)

## 2015-05-06 LAB — GLUCOSE, POCT (MANUAL RESULT ENTRY): POC Glucose: 294 mg/dl — AB (ref 70–99)

## 2015-05-06 MED ORDER — INSULIN GLARGINE 100 UNIT/ML ~~LOC~~ SOLN: 18.0000 [IU] | Freq: Every day | SUBCUTANEOUS | Status: DC

## 2015-05-06 NOTE — Telephone Encounter (Signed)
Noted  

## 2015-05-06 NOTE — Patient Instructions (Signed)
Gastroparesis °Gastroparesis, also called delayed gastric emptying, is a condition in which food takes longer than normal to empty from the stomach. The condition is usually long-lasting (chronic). °CAUSES °This condition may be caused by: °· An endocrine disorder, such as hypothyroidism or diabetes. Diabetes is the most common cause of this condition. °· A nervous system disease, such as Parkinson disease or multiple sclerosis. °· Cancer, infection, or surgery of the stomach or vagus nerve. °· A connective tissue disorder, such as scleroderma. °· Certain medicines. °In most cases, the cause is not known. °RISK FACTORS °This condition is more likely to develop in: °· People with certain disorders, including endocrine disorders, eating disorders, amyloidosis, and scleroderma. °· People with certain diseases, including Parkinson disease or multiple sclerosis. °· People with cancer or infection of the stomach or vagus nerve. °· People who have had surgery on the stomach or vagus nerve. °· People who take certain medicines. °· Women. °SYMPTOMS °Symptoms of this condition include: °· An early feeling of fullness when eating. °· Nausea. °· Weight loss. °· Vomiting. °· Heartburn. °· Abdominal bloating. °· Inconsistent blood glucose levels. °· Lack of appetite. °· Acid from the stomach coming up into the esophagus (gastroesophageal reflux). °· Spasms of the stomach. °Symptoms may come and go. °DIAGNOSIS °This condition is diagnosed with tests, such as: °· Tests that check how long it takes food to move through the stomach and intestines. These tests include: °¨ Upper gastrointestinal (GI) series. In this test, X-rays of the intestines are taken after you drink a liquid. The liquid makes the intestines show up better on the X-rays. °¨ Gastric emptying scintigraphy. In this test, scans are taken after you eat food that contains a small amount of radioactive material. °¨ Wireless capsule GI monitoring system. This test  involves swallowing a capsule that records information about movement through the stomach. °· Gastric manometry. This test measures electrical and muscular activity in the stomach. It is done with a thin tube that is passed down the throat and into the stomach. °· Endoscopy. This test checks for abnormalities in the lining of the stomach. It is done with a long, thin tube that is passed down the throat and into the stomach. °· An ultrasound. This test can help rule out gallbladder disease or pancreatitis as a cause of your symptoms. It uses sound waves to take pictures of the inside of your body. °TREATMENT °There is no cure for gastroparesis. This condition may be managed with: °· Treatment of the underlying condition causing the gastroparesis. °· Lifestyle changes, including exercise and dietary changes. Dietary changes can include: °¨ Changes in what and when you eat. °¨ Eating smaller meals more often. °¨ Eating low-fat foods. °¨ Eating low-fiber forms of high-fiber foods, such as cooked vegetables instead of raw vegetables. °¨ Having liquid foods in place of solid foods. Liquid foods are easier to digest. °· Medicines. These may be given to control nausea and vomiting and to stimulate stomach muscles. °· Getting food through a feeding tube. This may be done in severe cases. °· A gastric neurostimulator. This is a device that is inserted into the body with surgery. It helps improve stomach emptying and control nausea and vomiting. °HOME CARE INSTRUCTIONS °· Follow your health care provider's instructions about exercise and diet. °· Take medicines only as directed by your health care provider. °SEEK MEDICAL CARE IF: °· Your symptoms do not improve with treatment. °· You have new symptoms. °SEEK IMMEDIATE MEDICAL CARE IF: °· You have   severe abdominal pain that does not improve with treatment. °· You have nausea that does not go away. °· You cannot keep fluids down. °  °This information is not intended to replace  advice given to you by your health care provider. Make sure you discuss any questions you have with your health care provider. °  °Document Released: 02/14/2005 Document Revised: 07/01/2014 Document Reviewed: 02/10/2014 °Elsevier Interactive Patient Education ©2016 [employer] ° °

## 2015-05-06 NOTE — Progress Notes (Signed)
Subjective:  Patient ID: Kelly Santana, female    DOB: [birth year]  Age: 55 y.o. MRN: [id number]  CC: Follow-up   HPI Kelly Santana is a 55 year old female with a history of type 2 diabetes mellitus (A1c 8.4), diabetic gastroparesis, hypertension, anxiety, asthma who comes into the clinic for a follow-up visit.  She continues to experience symptoms of gastroparesis (and has had several ED visits for same ) with nausea and abdominal discomfort worse when she eats meat or solids and is only able to keep down soups and smoothies. She continues to take Reglan which helps with the symptoms but minimally and is scheduled to see GI on 05/27/15. She has been out of work on short-term disability due to the fact that she feels sick constantly and is requesting an extension.  Asthma symptoms are controlled and she rarely has to use her rescue inhaler; she remains compliant with her antihypertensives and anxiety is well controlled on current medication.  She was referred for a mammogram due to left breast hematoma at a previous visit and has an upcoming appointment tomorrow.  Outpatient Prescriptions Prior to Visit  Medication Sig Dispense Refill  . albuterol (PROVENTIL HFA;VENTOLIN HFA) 108 (90 BASE) MCG/ACT inhaler Inhale 1-2 puffs into the lungs every 6 (six) hours as needed for wheezing or shortness of breath.    Marland Kitchen amLODipine (NORVASC) 5 MG tablet Take 1 tablet (5 mg total) by mouth daily. 30 tablet 3  . blood glucose meter kit and supplies KIT Dispense based on patient and insurance preference. Use up to four times daily as directed. (FOR ICD-9 250.00, 250.01). 1 each 0  . cephALEXin (KEFLEX) 500 MG capsule Take 1 capsule (500 mg total) by mouth 2 (two) times daily. 14 capsule 0  . gabapentin (NEURONTIN) 300 MG capsule Take 300 mg by mouth at bedtime.     Marland Kitchen glimepiride (AMARYL) 4 MG tablet Take 4 mg by mouth 2 (two) times daily.     . hydrOXYzine (ATARAX/VISTARIL) 25 MG tablet Take 25 mg by  mouth 3 (three) times daily as needed.    Marland Kitchen KLOR-CON M20 20 MEQ tablet Take 20 mEq by mouth daily.    Marland Kitchen loratadine (CLARITIN) 10 MG tablet Take 10 mg by mouth daily.    . metoCLOPramide (REGLAN) 10 MG tablet Take 1 tablet (10 mg total) by mouth 3 (three) times daily as needed for nausea (headache / nausea). 20 tablet 0  . metoprolol (LOPRESSOR) 50 MG tablet Take 50 mg by mouth daily.    . Multiple Vitamin (MULTIVITAMIN WITH MINERALS) TABS tablet Take 1 tablet by mouth daily.    . ondansetron (ZOFRAN) 4 MG tablet Take 1 tablet (4 mg total) by mouth every 6 (six) hours. 12 tablet 0  . ranitidine (ZANTAC) 300 MG tablet Take 300 mg by mouth every morning.     . simvastatin (ZOCOR) 10 MG tablet Take 10 mg by mouth daily.     . Syringe/Needle, Disp, (SYRINGE 3CC/21GX1-1/4") 21G X 1-1/4" 3 ML MISC Inject 1 application as directed daily. 30 each 0  . fluconazole (DIFLUCAN) 100 MG tablet Take 1 tablet (100 mg total) by mouth once. 1 tablet 0  . insulin glargine (LANTUS) 100 UNIT/ML injection Inject 0.14 mLs (14 Units total) into the skin at bedtime. 10 mL 11  . colchicine (COLCRYS) 0.6 MG tablet Take 1 tablet (0.6 mg total) by mouth 2 (two) times daily. (Patient not taking: Reported on 04/23/2015) 20 tablet 0  . cyclobenzaprine (FLEXERIL)  10 MG tablet Take 1 tablet (10 mg total) by mouth 3 (three) times daily as needed for muscle spasms. (Patient not taking: Reported on 04/29/2015) 10 tablet 0  . HYDROcodone-acetaminophen (NORCO/VICODIN) 5-325 MG tablet Take 2 tablets by mouth every 4 (four) hours as needed. (Patient not taking: Reported on 05/06/2015) 15 tablet 0  . ondansetron (ZOFRAN ODT) 4 MG disintegrating tablet Take 1 tablet (4 mg total) by mouth every 8 (eight) hours as needed for nausea or vomiting. (Patient not taking: Reported on 04/23/2015) 15 tablet 0  . traMADol (ULTRAM) 50 MG tablet Take 1 tablet (50 mg total) by mouth every 6 (six) hours as needed. (Patient not taking: Reported on 04/23/2015) 16  tablet 0   No facility-administered medications prior to visit.    ROS Review of Systems  Constitutional: Negative for activity change, appetite change and fatigue.  HENT: Negative for congestion, sinus pressure and sore throat.   Eyes: Negative for visual disturbance.  Respiratory: Negative for cough, chest tightness, shortness of breath and wheezing.   Cardiovascular: Negative for chest pain and palpitations.  Gastrointestinal: Positive for nausea and vomiting. Negative for abdominal pain, constipation and abdominal distention.  Endocrine: Negative for polydipsia.  Genitourinary: Negative for dysuria and frequency.  Musculoskeletal: Negative for back pain and arthralgias.  Skin: Negative for rash.  Neurological: Negative for tremors, light-headedness and numbness.  Hematological: Does not bruise/bleed easily.  Psychiatric/Behavioral: Negative for behavioral problems and agitation.    Objective:  BP 123/80 mmHg  Pulse 77  Temp(Src) 98.1 F (36.7 C)  Resp 15  Ht 5' 5" (1.651 m)  Wt 165 lb (74.844 kg)  BMI 27.46 kg/m2  SpO2 98%  BP/Weight 05/06/2015 04/29/2015 8/36/6294  Systolic BP 765 465 035  Diastolic BP 80 83 82  Wt. (Lbs) 165 - -  BMI 27.46 - -      Physical Exam  Constitutional: She is oriented to person, place, and time. She appears well-developed and well-nourished.  Cardiovascular: Normal rate, normal heart sounds and intact distal pulses.   No murmur heard. Pulmonary/Chest: Effort normal and breath sounds normal. She has no wheezes. She has no rales. She exhibits no tenderness.  Abdominal: Soft. Bowel sounds are normal. She exhibits no distension and no mass. There is no tenderness.  Musculoskeletal: Normal range of motion.  Neurological: She is alert and oriented to person, place, and time.    CMP Latest Ref Rng 04/29/2015 04/27/2015 04/24/2015  Glucose 65 - 99 mg/dL 145(H) 94 235(H)  BUN 6 - 20 mg/dL _0 Creatinine 0.44 - 1.00 mg/dL 0.99 0.81 0.88    Sodium 135 - 145 mmol/L 138 138 140  Potassium 3.5 - 5.1 mmol/L 3.2(L) 3.5 4.1  Chloride 101 - 111 mmol/L 93(L) 98(L) 101  CO2 22 - 32 mmol/L 33(H) 27 30  Calcium 8.9 - 10.3 mg/dL 9.9 9.9 9.5  Total Protein 6.5 - 8.1 g/dL 7.9 7.3 7.1  Total Bilirubin 0.3 - 1.2 mg/dL 1.4(H) 0.8 0.5  Alkaline Phos 38 - 126 U/L 49 47 53  AST 15 - 41 U/L _1 ALT 14 - 54 U/L 19 27 40     Assessment & Plan:   1. Type 2 diabetes mellitus with hyperglycemia, with long-term current use of insulin (HCC) Uncontrolled with A1c of 8.4 Increased Lantus to 18 units at bedtime Keep blood sugar logs with fasting goals of 80-120 mg/dl, random of less than 180 and in the event of sugars less than 60 mg/dl  or greater than 400 mg/dl please notify the clinic ASAP. It is recommended that you undergo annual eye exams and annual foot exams. Pneumovax is recommended every 5 years before the age of 37 and once for a lifetime at or after the age of 83. - Glucose (CBG) - insulin glargine (LANTUS) 100 UNIT/ML injection; Inject 0.18 mLs (18 Units total) into the skin at bedtime.  Dispense: 10 mL; Refill: 3  2. Essential hypertension Controlled States she takes benazepril which we have no record off on file Continue antihypertensives, low-sodium, DASH diet  3. Anxiety state Continue hydroxyzine as needed  4. Asthma, mild intermittent, uncomplicated Mild persistent Continue Proventil MDI when necessary  5. Gastroparesis Uncontrolled on current antiemetics. Advised to eat frequent small meals. Scheduled to see GI on 05/27/15 Note for work provided until her visit with GI which I have informed I will not be extending and any subsequent out of work note would be given by GI as deemed necessary  6. Hypokalemia Last potassium of 3.2 which could have been secondary to GI losses We'll repeat today and patient should be calling the clinic back with the dose of her Benazepril which could be increased in the event that she  is still hypokalemic. I have also discussed with her the option of placing her on prescription potassium pills.  Meds ordered this encounter  Medications  . insulin glargine (LANTUS) 100 UNIT/ML injection    Sig: Inject 0.18 mLs (18 Units total) into the skin at bedtime.    Dispense:  10 mL    Refill:  3    Discontinue previous dose    Follow-up: Return in about 1 month (around 06/06/2015) for Follow-up on diabetes mellitus.   Arnoldo Morale MD

## 2015-05-06 NOTE — Telephone Encounter (Signed)
Will route to MD

## 2015-05-06 NOTE — Telephone Encounter (Signed)
Pt. Called stating that the medication of Benazepril 40mg  is not in her current medications but she said she has been taking this medication for a long time. She stated that her PCP wanted to know the mg that she is taking. Please f/u

## 2015-05-06 NOTE — Progress Notes (Signed)
Patient states gastroparesis is no better She can only eat Glucerna shakes and smoothies Regular food causes her to vomit She has GI appointment on 3/29 Mammogram tomorrow

## 2015-05-07 ENCOUNTER — Ambulatory Visit
Admission: RE | Admit: 2015-05-07 | Discharge: 2015-05-07 | Disposition: A | Discharge status: Home / Self Care | Payer: BLUE CROSS/BLUE SHIELD | Source: Ambulatory Visit | Attending: Family Medicine | Admitting: Family Medicine

## 2015-05-07 ENCOUNTER — Encounter: Payer: Self-pay | Admitting: *Deleted

## 2015-05-07 DIAGNOSIS — N6002 Solitary cyst of left breast: Secondary | ICD-10-CM

## 2015-05-07 NOTE — Progress Notes (Signed)
Patient ID: [name], female   DOB: Jul 10, 1960, 55 y.o.   MRN: [id number]   Treatment notes from office visit on 04/23/15 faxed to [employer] for patient

## 2015-05-14 ENCOUNTER — Encounter: Payer: Self-pay | Admitting: *Deleted

## 2015-05-14 NOTE — Progress Notes (Signed)
Printing last office notes and faxing to [employer]  [employer] disability benefits at [phone]

## 2015-05-27 ENCOUNTER — Encounter: Payer: Self-pay | Admitting: Gastroenterology

## 2015-05-27 ENCOUNTER — Other Ambulatory Visit: Payer: Self-pay | Admitting: Family Medicine

## 2015-05-27 ENCOUNTER — Ambulatory Visit (INDEPENDENT_AMBULATORY_CARE_PROVIDER_SITE_OTHER): Payer: BLUE CROSS/BLUE SHIELD | Admitting: Gastroenterology

## 2015-05-27 VITALS — BP 133/80 | HR 74 | Ht 65.0 in | Wt 166.4 lb

## 2015-05-27 DIAGNOSIS — R1013 Epigastric pain: Secondary | ICD-10-CM

## 2015-05-27 DIAGNOSIS — R1115 Cyclical vomiting syndrome unrelated to migraine: Secondary | ICD-10-CM

## 2015-05-27 DIAGNOSIS — K219 Gastro-esophageal reflux disease without esophagitis: Secondary | ICD-10-CM

## 2015-05-27 DIAGNOSIS — G43A0 Cyclical vomiting, not intractable: Secondary | ICD-10-CM | POA: Diagnosis not present

## 2015-05-27 DIAGNOSIS — E118 Type 2 diabetes mellitus with unspecified complications: Secondary | ICD-10-CM | POA: Diagnosis not present

## 2015-05-27 DIAGNOSIS — K3184 Gastroparesis: Secondary | ICD-10-CM

## 2015-05-27 DIAGNOSIS — I10 Essential (primary) hypertension: Secondary | ICD-10-CM

## 2015-05-27 DIAGNOSIS — G8929 Other chronic pain: Secondary | ICD-10-CM

## 2015-05-27 DIAGNOSIS — K5901 Slow transit constipation: Secondary | ICD-10-CM

## 2015-05-27 MED ORDER — TRAMADOL HCL 50 MG PO TABS: 50.0000 mg | ORAL_TABLET | Freq: Four times a day (QID) | ORAL | Status: DC | PRN

## 2015-05-27 MED ORDER — METOPROLOL TARTRATE 50 MG PO TABS: 50.0000 mg | ORAL_TABLET | Freq: Two times a day (BID) | ORAL | Status: DC

## 2015-05-27 MED ORDER — RANITIDINE HCL 300 MG PO TABS: 300.0000 mg | ORAL_TABLET | Freq: Every day | ORAL | Status: DC

## 2015-05-27 MED ORDER — KLOR-CON M20 20 MEQ PO TBCR: 20.0000 meq | EXTENDED_RELEASE_TABLET | Freq: Every day | ORAL | Status: DC

## 2015-05-27 NOTE — Patient Instructions (Signed)
You have been scheduled for an endoscopy. Please follow written instructions given to you at your visit today. If you use inhalers (even only as needed), please bring them with you on the day of your procedure. Your physician has requested that you go to www.startemmi.com and enter the access code given to you at your visit today. This web site gives a general overview about your procedure. However, you should still follow specific instructions given to you by our office regarding your preparation for the procedure.  Thank you for choosing [facility]  Dr Henry Danis III 
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
## 2015-06-03 ENCOUNTER — Encounter: Payer: Self-pay | Admitting: *Deleted

## 2015-06-03 NOTE — Progress Notes (Signed)
Patient ID: [name], female   DOB: 06-17-1960, 55 y.o.   MRN: [id number]   Patient called in and states she takes HCTZ 12.5 daily-added to med list

## 2015-06-15 ENCOUNTER — Ambulatory Visit (AMBULATORY_SURGERY_CENTER): Payer: BLUE CROSS/BLUE SHIELD | Admitting: Gastroenterology

## 2015-06-15 ENCOUNTER — Encounter: Payer: Self-pay | Admitting: Gastroenterology

## 2015-06-15 VITALS — BP 138/85 | HR 81 | Temp 97.5°F | Resp 14 | Ht 65.0 in | Wt 166.0 lb

## 2015-06-15 DIAGNOSIS — R1013 Epigastric pain: Secondary | ICD-10-CM | POA: Diagnosis present

## 2015-06-15 DIAGNOSIS — K259 Gastric ulcer, unspecified as acute or chronic, without hemorrhage or perforation: Secondary | ICD-10-CM

## 2015-06-15 LAB — GLUCOSE, CAPILLARY: Glucose-Capillary: 111 mg/dL — ABNORMAL HIGH (ref 65–99)

## 2015-06-15 MED ORDER — SODIUM CHLORIDE 0.9 % IV SOLN: 500.0000 mL | INTRAVENOUS | Status: DC

## 2015-06-15 NOTE — Patient Instructions (Signed)
Discharge instructions given. Biopsies taken. Resume previous medications. YOU HAD AN ENDOSCOPIC PROCEDURE TODAY AT THE [facility]:   Refer to the procedure report that was given to you for any specific questions about what was found during the examination.  If the procedure report does not answer your questions, please call your gastroenterologist to clarify.  If you requested that your care partner not be given the details of your procedure findings, then the procedure report has been included in a sealed envelope for you to review at your convenience later.  YOU SHOULD EXPECT: Some feelings of bloating in the abdomen. Passage of more gas than usual.  Walking can help get rid of the air that was put into your GI tract during the procedure and reduce the bloating. If you had a lower endoscopy (such as a colonoscopy or flexible sigmoidoscopy) you may notice spotting of blood in your stool or on the toilet paper. If you underwent a bowel prep for your procedure, you may not have a normal bowel movement for a few days.  Please Note:  You might notice some irritation and congestion in your nose or some drainage.  This is from the oxygen used during your procedure.  There is no need for concern and it should clear up in a day or so.  SYMPTOMS TO REPORT IMMEDIATELY:   Following upper endoscopy (EGD)  Vomiting of blood or coffee ground material  New chest pain or pain under the shoulder blades  Painful or persistently difficult swallowing  New shortness of breath  Fever of 100F or higher  Black, tarry-looking stools  For urgent or emergent issues, a gastroenterologist can be reached at any hour by calling [phone].   DIET: Your first meal following the procedure should be a small meal and then it is ok to progress to your normal diet. Heavy or fried foods are harder to digest and may make you feel nauseous or bloated.  Likewise, meals heavy in dairy and vegetables can increase  bloating.  Drink plenty of fluids but you should avoid alcoholic beverages for 24 hours.  ACTIVITY:  You should plan to take it easy for the rest of today and you should NOT DRIVE or use heavy machinery until tomorrow (because of the sedation medicines used during the test).    FOLLOW UP: Our staff will call the number listed on your records the next business day following your procedure to check on you and address any questions or concerns that you may have regarding the information given to you following your procedure. If we do not reach you, we will leave a message.  However, if you are feeling well and you are not experiencing any problems, there is no need to return our call.  We will assume that you have returned to your regular daily activities without incident.  If any biopsies were taken you will be contacted by phone or by letter within the next 1-3 weeks.  Please call us at [phone] if you have not heard about the biopsies in 3 weeks.    SIGNATURES/CONFIDENTIALITY: You and/or your care partner have signed paperwork which will be entered into your electronic medical record.  These signatures attest to the fact that that the information above on your After Visit Summary has been reviewed and is understood.  Full responsibility of the confidentiality of this discharge information lies with you and/or your care-partner. 

## 2015-06-15 NOTE — Progress Notes (Signed)
Report given to PACU RN, vss 

## 2015-06-15 NOTE — Op Note (Signed)
[facility] Patient Name: Kelly Santana Procedure Date: 06/15/2015 1:49 PM MRN: [id number] Endoscopist: Sherilyn Cooter L. Myrtie Neither , MD Age: 55 Date of Birth: [birth year] Gender: Female Procedure:                Upper GI endoscopy Indications:              Epigastric abdominal pain, Nausea with vomiting Medicines:                Monitored Anesthesia Care Procedure:                Pre-Anesthesia Assessment:                           - Prior to the procedure, a History and Physical                            was performed, and patient medications and                            allergies were reviewed. The patient's tolerance of                            previous anesthesia was also reviewed. The risks                            and benefits of the procedure and the sedation                            options and risks were discussed with the patient.                            All questions were answered, and informed consent                            was obtained. Prior Anticoagulants: The patient has                            taken no previous anticoagulant or antiplatelet                            agents. ASA Grade Assessment: III - A patient with                            severe systemic disease. After reviewing the risks                            and benefits, the patient was deemed in                            satisfactory condition to undergo the procedure.                           After obtaining informed consent, the endoscope was  passed under direct vision. Throughout the                            procedure, the patient's blood pressure, pulse, and                            oxygen saturations were monitored continuously. The                            Model [id number]-[id number]) scope was introduced                            through the mouth, and advanced to the second part                            of duodenum. The upper GI endoscopy was                             accomplished without difficulty. The patient                            tolerated the procedure well. Scope In: Scope Out: Findings:                 The esophagus was normal.                           One non-bleeding linear gastric ulcer with no                            stigmata of bleeding was found in the gastric                            antrum. The lesion was 15 mm in largest dimension                            by 2 mm width. Biopsies were taken from the edge                            with a cold forceps for histology. Additional                            biopsies were taken from the gastric antrum and                            body with a cold forceps to rule out Helicobacter                            pylori.                           The examined duodenum was normal.                           The cardia and gastric  fundus were normal on                            retroflexion. Complications:            No immediate complications. Estimated Blood Loss:     Estimated blood loss was minimal. Impression:               - Normal esophagus.                           - Non-bleeding gastric ulcer with no stigmata of                            bleeding. Biopsied.                           - Normal examined duodenum. Recommendation:           - Resume previous diet.                           - No aspirin, ibuprofen, naproxen, or other                            non-steroidal anti-inflammatory drugs.                           - Await pathology results.                           - Use Prilosec OTC 20 mg PO daily for 6 weeks. Andrw Mcguirt L. Myrtie Neither, MD 06/15/2015 2:09:59 PM This report has been signed electronically.

## 2015-06-15 NOTE — Progress Notes (Signed)
Called to room to assist during endoscopic procedure.  Patient ID and intended procedure confirmed with present staff. Received instructions for my participation in the procedure from the performing physician.  

## 2015-06-16 ENCOUNTER — Ambulatory Visit: Payer: BLUE CROSS/BLUE SHIELD | Attending: Family Medicine | Admitting: Family Medicine

## 2015-06-16 ENCOUNTER — Encounter: Payer: Self-pay | Admitting: Family Medicine

## 2015-06-16 ENCOUNTER — Telehealth: Payer: Self-pay

## 2015-06-16 VITALS — BP 149/97 | HR 67 | Temp 98.1°F | Resp 16 | Ht 65.0 in | Wt 169.2 lb

## 2015-06-16 DIAGNOSIS — E1149 Type 2 diabetes mellitus with other diabetic neurological complication: Secondary | ICD-10-CM | POA: Diagnosis not present

## 2015-06-16 DIAGNOSIS — I10 Essential (primary) hypertension: Secondary | ICD-10-CM | POA: Diagnosis not present

## 2015-06-16 DIAGNOSIS — Z79899 Other long term (current) drug therapy: Secondary | ICD-10-CM | POA: Diagnosis not present

## 2015-06-16 DIAGNOSIS — K259 Gastric ulcer, unspecified as acute or chronic, without hemorrhage or perforation: Secondary | ICD-10-CM | POA: Insufficient documentation

## 2015-06-16 DIAGNOSIS — E118 Type 2 diabetes mellitus with unspecified complications: Secondary | ICD-10-CM | POA: Insufficient documentation

## 2015-06-16 DIAGNOSIS — K253 Acute gastric ulcer without hemorrhage or perforation: Secondary | ICD-10-CM | POA: Insufficient documentation

## 2015-06-16 DIAGNOSIS — J45909 Unspecified asthma, uncomplicated: Secondary | ICD-10-CM | POA: Insufficient documentation

## 2015-06-16 DIAGNOSIS — Z794 Long term (current) use of insulin: Secondary | ICD-10-CM | POA: Diagnosis not present

## 2015-06-16 DIAGNOSIS — J4520 Mild intermittent asthma, uncomplicated: Secondary | ICD-10-CM | POA: Diagnosis not present

## 2015-06-16 DIAGNOSIS — F411 Generalized anxiety disorder: Secondary | ICD-10-CM | POA: Insufficient documentation

## 2015-06-16 LAB — GLUCOSE, CAPILLARY: Glucose-Capillary: 129 mg/dL — ABNORMAL HIGH (ref 65–99)

## 2015-06-16 LAB — GLUCOSE, POCT (MANUAL RESULT ENTRY): POC GLUCOSE: 157 mg/dL — AB (ref 70–99)

## 2015-06-16 MED ORDER — INSULIN GLARGINE 100 UNIT/ML ~~LOC~~ SOLN: 23.0000 [IU] | Freq: Every day | SUBCUTANEOUS | Status: DC

## 2015-06-16 MED ORDER — OMEPRAZOLE 20 MG PO CPDR: 20.0000 mg | DELAYED_RELEASE_CAPSULE | Freq: Every day | ORAL | Status: DC

## 2015-06-16 MED ORDER — BENAZEPRIL HCL 40 MG PO TABS: 40.0000 mg | ORAL_TABLET | Freq: Every day | ORAL | Status: DC

## 2015-06-16 MED ORDER — HYDROXYZINE HCL 25 MG PO TABS
25.0000 mg | ORAL_TABLET | Freq: Three times a day (TID) | ORAL | Status: DC | PRN
Start: 2015-06-16 — End: 2015-12-23

## 2015-06-16 MED ORDER — GLIMEPIRIDE 4 MG PO TABS: 4.0000 mg | ORAL_TABLET | Freq: Two times a day (BID) | ORAL | Status: DC

## 2015-06-16 MED ORDER — ALBUTEROL SULFATE HFA 108 (90 BASE) MCG/ACT IN AERS: 1.0000 | INHALATION_SPRAY | Freq: Four times a day (QID) | RESPIRATORY_TRACT | Status: DC | PRN

## 2015-06-16 MED ORDER — GABAPENTIN 300 MG PO CAPS: 300.0000 mg | ORAL_CAPSULE | Freq: Every day | ORAL | Status: DC

## 2015-06-16 MED ORDER — PNEUMOCOCCAL VAC POLYVALENT 25 MCG/0.5ML IJ INJ
0.5000 mL | INJECTION | Freq: Once | INTRAMUSCULAR | Status: AC
  Administered 2015-06-16: 0.5 mL via INTRAMUSCULAR

## 2015-06-16 MED ORDER — HYDROCHLOROTHIAZIDE 12.5 MG PO CAPS
12.5000 mg | ORAL_CAPSULE | Freq: Every day | ORAL | Status: DC
Start: 2015-06-16 — End: 2015-07-24

## 2015-06-16 MED ORDER — LORATADINE 10 MG PO TABS: 10.0000 mg | ORAL_TABLET | Freq: Every day | ORAL | Status: DC

## 2015-06-16 MED ORDER — KLOR-CON M20 20 MEQ PO TBCR: 20.0000 meq | EXTENDED_RELEASE_TABLET | Freq: Every day | ORAL | Status: DC

## 2015-06-16 NOTE — Patient Instructions (Signed)
Peptic Ulcer ° °A peptic ulcer is a sore in the lining of your esophagus (esophageal ulcer), stomach (gastric ulcer), or in the first part of your small intestine (duodenal ulcer). The ulcer causes erosion into the deeper tissue. °CAUSES  °Normally, the lining of the stomach and the small intestine protects itself from the acid that digests food. The protective lining can be damaged by: °· An infection caused by a bacterium called Helicobacter pylori (H. pylori). °· Regular use of nonsteroidal anti-inflammatory drugs (NSAIDs), such as ibuprofen or aspirin. °· Smoking tobacco. °Other risk factors include being older than 50, drinking alcohol excessively, and having a family history of ulcer disease.  °SYMPTOMS  °· Burning pain or gnawing in the area between the chest and the belly button. °· Heartburn. °· Nausea and vomiting. °· Bloating. °The pain can be worse on an empty stomach and at night. If the ulcer results in bleeding, it can cause: °· Black, tarry stools. °· Vomiting of bright red blood. °· Vomiting of coffee-ground-looking materials. °DIAGNOSIS  °A diagnosis is usually made based upon your history and an exam. Other tests and procedures may be performed to find the cause of the ulcer. Finding a cause will help determine the best treatment. Tests and procedures may include: °· Blood tests, stool tests, or breath tests to check for the bacterium H. pylori. °· An upper gastrointestinal (GI) series of the esophagus, stomach, and small intestine. °· An endoscopy to examine the esophagus, stomach, and small intestine. °· A biopsy. °TREATMENT  °Treatment may include: °· Eliminating the cause of the ulcer, such as smoking, NSAIDs, or alcohol. °· Medicines to reduce the amount of acid in your digestive tract. °· Antibiotic medicines if the ulcer is caused by the H. pylori bacterium. °· An upper endoscopy to treat a bleeding ulcer. °· Surgery if the bleeding is severe or if the ulcer created a hole somewhere in the  digestive system. °HOME CARE INSTRUCTIONS  °· Avoid tobacco, alcohol, and caffeine. Smoking can increase the acid in the stomach, and continued smoking will impair the healing of ulcers. °· Avoid foods and drinks that seem to cause discomfort or aggravate your ulcer. °· Only take medicines as directed by your caregiver. Do not substitute over-the-counter medicines for prescription medicines without talking to your caregiver. °· Keep any follow-up appointments and tests as directed. °SEEK MEDICAL CARE IF:  °· Your do not improve within 7 days of starting treatment. °· You have ongoing indigestion or heartburn. °SEEK IMMEDIATE MEDICAL CARE IF:  °· You have sudden, sharp, or persistent abdominal pain. °· You have bloody or dark black, tarry stools. °· You vomit blood or vomit that looks like coffee grounds. °· You become light-headed, weak, or feel faint. °· You become sweaty or clammy. °MAKE SURE YOU:  °· Understand these instructions. °· Will watch your condition. °· Will get help right away if you are not doing well or get worse. °  °This information is not intended to replace advice given to you by your health care provider. Make sure you discuss any questions you have with your health care provider. °  °Document Released: 02/12/2000 Document Revised: 03/07/2014 Document Reviewed: 09/14/2011 °Elsevier Interactive Patient Education ©2016 [employer] ° °

## 2015-06-16 NOTE — Progress Notes (Signed)
Patient's here for f/up DM.   Patient has no further concerns.  Patient tolerated injection well.

## 2015-06-16 NOTE — Progress Notes (Signed)
Subjective:  Patient ID: Kelly Santana, female    DOB: 1961/01/18  Age: 55 y.o. MRN: [id number]  CC: Follow-up and Diabetes   HPI Kelly Santana is a 55 year old female with a history of Type 2 Dm (A1c 8.4), HTN, Asthma, Anxiety who comes in to the clinic for a follow up visit. Lantus was increased to18 units at her last visit and she reports fasting sugars are in the 200s. Denies hypoglycemia. Last eye exam was last year; she does have some numbness in her heels.  She recently had an upper endoscopy which revealed a non bleeding gastric ulcer for which she was placed on Omeprazole and she denies any nausea or abdominal pain or diarrhea or constipation.   Her Asthma is well controlled on Proventil MDI and Anxiety is controlled. She has no other complains.  Outpatient Prescriptions Prior to Visit  Medication Sig Dispense Refill  . amLODipine (NORVASC) 5 MG tablet Take 1 tablet (5 mg total) by mouth daily. 30 tablet 3  . blood glucose meter kit and supplies KIT Dispense based on patient and insurance preference. Use up to four times daily as directed. (FOR ICD-9 250.00, 250.01). 1 each 0  . metoprolol (LOPRESSOR) 50 MG tablet Take 1 tablet (50 mg total) by mouth 2 (two) times daily. 60 tablet 2  . Multiple Vitamin (MULTIVITAMIN WITH MINERALS) TABS tablet Take 1 tablet by mouth daily.    . simvastatin (ZOCOR) 10 MG tablet Take 10 mg by mouth daily.     . Syringe/Needle, Disp, (SYRINGE 3CC/21GX1-1/4") 21G X 1-1/4" 3 ML MISC Inject 1 application as directed daily. 30 each 0  . albuterol (PROVENTIL HFA;VENTOLIN HFA) 108 (90 BASE) MCG/ACT inhaler Inhale 1-2 puffs into the lungs every 6 (six) hours as needed for wheezing or shortness of breath.    . benazepril (LOTENSIN) 40 MG tablet Take 40 mg by mouth daily.    Marland Kitchen gabapentin (NEURONTIN) 300 MG capsule Take 300 mg by mouth at bedtime.     Marland Kitchen glimepiride (AMARYL) 4 MG tablet Take 4 mg by mouth 2 (two) times daily.     . hydrochlorothiazide  (MICROZIDE) 12.5 MG capsule Take 12.5 mg by mouth daily.    . hydrOXYzine (ATARAX/VISTARIL) 25 MG tablet Take 25 mg by mouth 3 (three) times daily as needed.    . insulin glargine (LANTUS) 100 UNIT/ML injection Inject 0.18 mLs (18 Units total) into the skin at bedtime. 10 mL 3  . KLOR-CON M20 20 MEQ tablet Take 1 tablet (20 mEq total) by mouth daily. 30 tablet 2  . loratadine (CLARITIN) 10 MG tablet Take 10 mg by mouth daily.    . ondansetron (ZOFRAN) 4 MG tablet Take 1 tablet (4 mg total) by mouth every 6 (six) hours. (Patient not taking: Reported on 06/16/2015) 12 tablet 0  . metoCLOPramide (REGLAN) 10 MG tablet Take 1 tablet (10 mg total) by mouth 3 (three) times daily as needed for nausea (headache / nausea). (Patient not taking: Reported on 06/16/2015) 20 tablet 0  . ranitidine (ZANTAC) 300 MG tablet Take 1 tablet (300 mg total) by mouth daily. (Patient not taking: Reported on 06/16/2015) 30 tablet 2  . traMADol (ULTRAM) 50 MG tablet Take 1 tablet (50 mg total) by mouth every 6 (six) hours as needed. (Patient not taking: Reported on 06/16/2015) 30 tablet 1   Facility-Administered Medications Prior to Visit  Medication Dose Route Frequency Provider Last Rate Last Dose  . 0.9 %  sodium chloride infusion  500 mL Intravenous Continuous Nelida Meuse III, MD        ROS Review of Systems  Constitutional: Negative for activity change, appetite change and fatigue.  HENT: Negative for congestion, sinus pressure and sore throat.   Eyes: Negative for visual disturbance.  Respiratory: Negative for cough, chest tightness, shortness of breath and wheezing.   Cardiovascular: Negative for chest pain and palpitations.  Gastrointestinal: Negative for abdominal pain, constipation and abdominal distention.  Endocrine: Negative for polydipsia.  Genitourinary: Negative for dysuria and frequency.  Musculoskeletal: Negative for back pain and arthralgias.  Skin: Negative for rash.  Neurological: Positive for  numbness. Negative for tremors and light-headedness.  Hematological: Does not bruise/bleed easily.  Psychiatric/Behavioral: Negative for behavioral problems and agitation.    Objective:  BP 149/97 mmHg  Pulse 67  Temp(Src) 98.1 F (36.7 C) (Oral)  Resp 16  Ht '5\' 5"'$  (1.651 m)  Wt 169 lb 3.2 oz (76.749 kg)  BMI 28.16 kg/m2  SpO2 100%  BP/Weight 06/16/2015 06/15/2015 7/78/2423  Systolic BP 536 144 315  Diastolic BP 97 85 80  Wt. (Lbs) 169.2 166 166.4  BMI 28.16 27.62 27.69      Physical Exam  Constitutional: She is oriented to person, place, and time. She appears well-developed and well-nourished.  Neck: Normal range of motion.  Cardiovascular: Normal rate, normal heart sounds and intact distal pulses.   No murmur heard. Pulmonary/Chest: Effort normal and breath sounds normal. She has no wheezes. She has no rales. She exhibits no tenderness.  Abdominal: Soft. Bowel sounds are normal. She exhibits no distension and no mass. There is no tenderness.  Musculoskeletal: Normal range of motion.  Neurological: She is alert and oriented to person, place, and time.  Skin: Skin is warm and dry.   Lab Results  Component Value Date   HGBA1C 8.40 04/07/2015    CMP Latest Ref Rng 05/06/2015 04/29/2015 04/27/2015  Glucose 65 - 99 mg/dL 291(H) 145(H) 94  BUN 7 - 25 mg/dL '17 13 7  '$ Creatinine 0.50 - 1.05 mg/dL 0.87 0.99 0.81  Sodium 135 - 146 mmol/L 135 138 138  Potassium 3.5 - 5.3 mmol/L 4.2 3.2(L) 3.5  Chloride 98 - 110 mmol/L 96(L) 93(L) 98(L)  CO2 20 - 31 mmol/L 28 33(H) 27  Calcium 8.6 - 10.4 mg/dL 9.4 9.9 9.9  Total Protein 6.5 - 8.1 g/dL - 7.9 7.3  Total Bilirubin 0.3 - 1.2 mg/dL - 1.4(H) 0.8  Alkaline Phos 38 - 126 U/L - 49 47  AST 15 - 41 U/L - 21 26  ALT 14 - 54 U/L - 19 27      Assessment & Plan:   1. Type 2 diabetes mellitus with complication, with long-term current use of insulin (HCC) A1c of 8.4 from 04/2015 Based on elevated fasting sugars in the 200s, Lantus increased  from 18 units to 23 units We'll review blood sugar log at next visit Fasting labs and microalbumin at next visit - Glucose (CBG) - pneumococcal 23 valent vaccine (PNU-IMMUNE) injection 0.5 mL; Inject 0.5 mLs into the muscle once. - glimepiride (AMARYL) 4 MG tablet; Take 1 tablet (4 mg total) by mouth 2 (two) times daily.  Dispense: 60 tablet; Refill: 3 - insulin glargine (LANTUS) 100 UNIT/ML injection; Inject 0.23 mLs (23 Units total) into the skin at bedtime.  Dispense: 10 mL; Refill: 3  2. Type 2 diabetes mellitus with other neurologic complication, with long-term current use of insulin (HCC) controlled - gabapentin (NEURONTIN) 300 MG capsule; Take  1 capsule (300 mg total) by mouth at bedtime.  Dispense: 30 capsule; Refill: 3  3. Asthma, mild intermittent, uncomplicated Controlled No exacerbations - albuterol (PROVENTIL HFA;VENTOLIN HFA) 108 (90 Base) MCG/ACT inhaler; Inhale 1-2 puffs into the lungs every 6 (six) hours as needed for wheezing or shortness of breath.  Dispense: 1 Inhaler; Refill: 1 - loratadine (CLARITIN) 10 MG tablet; Take 1 tablet (10 mg total) by mouth daily.  Dispense: 30 tablet; Refill: 3  4. Essential hypertension Mildly elevated above target of less than 140/90 Low-sodium, DASH diet - hydrochlorothiazide (MICROZIDE) 12.5 MG capsule; Take 1 capsule (12.5 mg total) by mouth daily.  Dispense: 30 capsule; Refill: 3 - benazepril (LOTENSIN) 40 MG tablet; Take 1 tablet (40 mg total) by mouth daily.  Dispense: 30 tablet; Refill: 3  5. Acute gastric ulcer Asymptomatic at this time - omeprazole (PRILOSEC) 20 MG capsule; Take 1 capsule (20 mg total) by mouth daily.  Dispense: 30 capsule; Refill: 3  6. Anxiety state - hydrOXYzine (ATARAX/VISTARIL) 25 MG tablet; Take 1 tablet (25 mg total) by mouth 3 (three) times daily as needed.  Dispense: 60 tablet; Refill: 3   Meds ordered this encounter  Medications  . pneumococcal 23 valent vaccine (PNU-IMMUNE) injection 0.5 mL     Sig:   . hydrochlorothiazide (MICROZIDE) 12.5 MG capsule    Sig: Take 1 capsule (12.5 mg total) by mouth daily.    Dispense:  30 capsule    Refill:  3  . albuterol (PROVENTIL HFA;VENTOLIN HFA) 108 (90 Base) MCG/ACT inhaler    Sig: Inhale 1-2 puffs into the lungs every 6 (six) hours as needed for wheezing or shortness of breath.    Dispense:  1 Inhaler    Refill:  1  . glimepiride (AMARYL) 4 MG tablet    Sig: Take 1 tablet (4 mg total) by mouth 2 (two) times daily.    Dispense:  60 tablet    Refill:  3  . omeprazole (PRILOSEC) 20 MG capsule    Sig: Take 1 capsule (20 mg total) by mouth daily.    Dispense:  30 capsule    Refill:  3  . KLOR-CON M20 20 MEQ tablet    Sig: Take 1 tablet (20 mEq total) by mouth daily.    Dispense:  30 tablet    Refill:  3  . insulin glargine (LANTUS) 100 UNIT/ML injection    Sig: Inject 0.23 mLs (23 Units total) into the skin at bedtime.    Dispense:  10 mL    Refill:  3    Discontinue previous dose  . benazepril (LOTENSIN) 40 MG tablet    Sig: Take 1 tablet (40 mg total) by mouth daily.    Dispense:  30 tablet    Refill:  3  . hydrOXYzine (ATARAX/VISTARIL) 25 MG tablet    Sig: Take 1 tablet (25 mg total) by mouth 3 (three) times daily as needed.    Dispense:  60 tablet    Refill:  3  . gabapentin (NEURONTIN) 300 MG capsule    Sig: Take 1 capsule (300 mg total) by mouth at bedtime.    Dispense:  30 capsule    Refill:  3  . loratadine (CLARITIN) 10 MG tablet    Sig: Take 1 tablet (10 mg total) by mouth daily.    Dispense:  30 tablet    Refill:  3    Follow-up: Return in about 1 month (around 07/16/2015) for complete physical exam and fasting labs.  Arnoldo Morale MD

## 2015-06-16 NOTE — Telephone Encounter (Signed)
  Follow up Call-  Call back number 06/15/2015  Post procedure Call Back phone  # [phone]  Permission to leave phone message Yes     Patient questions:  Do you have a fever, pain , or abdominal swelling? No. Pain Score  0 *  Have you tolerated food without any problems? Yes.    Have you been able to return to your normal activities? Yes.    Do you have any questions about your discharge instructions: Diet   No. Medications  No. Follow up visit  No.  Do you have questions or concerns about your Care? No.  Actions: * If pain score is 4 or above: No action needed, pain <4.

## 2015-06-18 ENCOUNTER — Encounter: Payer: Self-pay | Admitting: Gastroenterology

## 2015-07-24 ENCOUNTER — Ambulatory Visit: Payer: BLUE CROSS/BLUE SHIELD | Attending: Family Medicine | Admitting: Family Medicine

## 2015-07-24 ENCOUNTER — Encounter: Payer: Self-pay | Admitting: Family Medicine

## 2015-07-24 ENCOUNTER — Telehealth: Payer: Self-pay | Admitting: Gastroenterology

## 2015-07-24 ENCOUNTER — Other Ambulatory Visit (HOSPITAL_COMMUNITY)
Admission: RE | Admit: 2015-07-24 | Discharge: 2015-07-24 | Disposition: A | Discharge status: Home / Self Care | Payer: BLUE CROSS/BLUE SHIELD | Source: Ambulatory Visit | Attending: Family Medicine | Admitting: Family Medicine

## 2015-07-24 ENCOUNTER — Other Ambulatory Visit: Payer: Self-pay | Admitting: Family Medicine

## 2015-07-24 VITALS — BP 155/105 | HR 71 | Temp 98.5°F | Resp 16 | Ht 64.0 in | Wt 176.6 lb

## 2015-07-24 DIAGNOSIS — Z01419 Encounter for gynecological examination (general) (routine) without abnormal findings: Secondary | ICD-10-CM | POA: Insufficient documentation

## 2015-07-24 DIAGNOSIS — I10 Essential (primary) hypertension: Secondary | ICD-10-CM

## 2015-07-24 DIAGNOSIS — Z1151 Encounter for screening for human papillomavirus (HPV): Secondary | ICD-10-CM | POA: Diagnosis not present

## 2015-07-24 DIAGNOSIS — Z79899 Other long term (current) drug therapy: Secondary | ICD-10-CM | POA: Insufficient documentation

## 2015-07-24 DIAGNOSIS — E118 Type 2 diabetes mellitus with unspecified complications: Secondary | ICD-10-CM | POA: Insufficient documentation

## 2015-07-24 DIAGNOSIS — K253 Acute gastric ulcer without hemorrhage or perforation: Secondary | ICD-10-CM

## 2015-07-24 DIAGNOSIS — J4520 Mild intermittent asthma, uncomplicated: Secondary | ICD-10-CM

## 2015-07-24 DIAGNOSIS — Z794 Long term (current) use of insulin: Secondary | ICD-10-CM

## 2015-07-24 DIAGNOSIS — E1149 Type 2 diabetes mellitus with other diabetic neurological complication: Secondary | ICD-10-CM

## 2015-07-24 DIAGNOSIS — Z0000 Encounter for general adult medical examination without abnormal findings: Secondary | ICD-10-CM

## 2015-07-24 DIAGNOSIS — E785 Hyperlipidemia, unspecified: Secondary | ICD-10-CM

## 2015-07-24 LAB — BASIC METABOLIC PANEL
BUN: 18 mg/dL (ref 7–25)
CO2: 30 mmol/L (ref 20–31)
Calcium: 9.5 mg/dL (ref 8.6–10.4)
Chloride: 99 mmol/L (ref 98–110)
Creat: 0.98 mg/dL (ref 0.50–1.05)
GLUCOSE: 139 mg/dL — AB (ref 65–99)
POTASSIUM: 3.9 mmol/L (ref 3.5–5.3)
SODIUM: 142 mmol/L (ref 135–146)

## 2015-07-24 LAB — GLUCOSE, POCT (MANUAL RESULT ENTRY): POC GLUCOSE: 180 mg/dL — AB (ref 70–99)

## 2015-07-24 MED ORDER — GLUCOSE BLOOD VI STRP: ORAL_STRIP | Status: DC

## 2015-07-24 MED ORDER — SIMVASTATIN 10 MG PO TABS: 10.0000 mg | ORAL_TABLET | Freq: Every day | ORAL | Status: DC

## 2015-07-24 MED ORDER — "INSULIN SYRINGE-NEEDLE U-100 31G X 15/64"" 0.5 ML MISC": Status: DC

## 2015-07-24 MED ORDER — METOPROLOL TARTRATE 50 MG PO TABS: 50.0000 mg | ORAL_TABLET | Freq: Two times a day (BID) | ORAL | Status: DC

## 2015-07-24 MED ORDER — AMLODIPINE BESYLATE 5 MG PO TABS: 5.0000 mg | ORAL_TABLET | Freq: Every day | ORAL | Status: DC

## 2015-07-24 MED ORDER — INSULIN GLARGINE 100 UNIT/ML ~~LOC~~ SOLN: 28.0000 [IU] | Freq: Every day | SUBCUTANEOUS | Status: DC

## 2015-07-24 MED ORDER — HYDROCHLOROTHIAZIDE 25 MG PO TABS
25.0000 mg | ORAL_TABLET | Freq: Every day | ORAL | Status: DC
Start: 2015-07-24 — End: 2015-10-20

## 2015-07-24 MED ORDER — ALBUTEROL SULFATE HFA 108 (90 BASE) MCG/ACT IN AERS
1.0000 | INHALATION_SPRAY | Freq: Four times a day (QID) | RESPIRATORY_TRACT | Status: DC | PRN
Start: 2015-07-24 — End: 2017-02-24

## 2015-07-24 MED ORDER — RELION LANCETS MICRO-THIN 33G MISC: 1.0000 | Freq: Every day | Status: AC

## 2015-07-24 NOTE — Progress Notes (Signed)
Subjective:  Patient ID: Kelly Santana, female    DOB: Feb 01, 1961  Age: 55 y.o. MRN: [id number]  CC: Annual Exam   HPI Kelly Santana is a 55 year old female with a history of hypertension, diabetes mellitus (A1c 8.4) who comes into the clinic for a complete physical exam.  Outpatient Prescriptions Prior to Visit  Medication Sig Dispense Refill  . benazepril (LOTENSIN) 40 MG tablet Take 1 tablet (40 mg total) by mouth daily. 30 tablet 3  . blood glucose meter kit and supplies KIT Dispense based on patient and insurance preference. Use up to four times daily as directed. (FOR ICD-9 250.00, 250.01). 1 each 0  . gabapentin (NEURONTIN) 300 MG capsule Take 1 capsule (300 mg total) by mouth at bedtime. 30 capsule 3  . glimepiride (AMARYL) 4 MG tablet Take 1 tablet (4 mg total) by mouth 2 (two) times daily. 60 tablet 3  . hydrOXYzine (ATARAX/VISTARIL) 25 MG tablet Take 1 tablet (25 mg total) by mouth 3 (three) times daily as needed. 60 tablet 3  . KLOR-CON M20 20 MEQ tablet Take 1 tablet (20 mEq total) by mouth daily. 30 tablet 3  . loratadine (CLARITIN) 10 MG tablet Take 1 tablet (10 mg total) by mouth daily. 30 tablet 3  . Multiple Vitamin (MULTIVITAMIN WITH MINERALS) TABS tablet Take 1 tablet by mouth daily.    Marland Kitchen omeprazole (PRILOSEC) 20 MG capsule Take 1 capsule (20 mg total) by mouth daily. 30 capsule 3  . Syringe/Needle, Disp, (SYRINGE 3CC/21GX1-1/4") 21G X 1-1/4" 3 ML MISC Inject 1 application as directed daily. 30 each 0  . albuterol (PROVENTIL HFA;VENTOLIN HFA) 108 (90 Base) MCG/ACT inhaler Inhale 1-2 puffs into the lungs every 6 (six) hours as needed for wheezing or shortness of breath. 1 Inhaler 1  . amLODipine (NORVASC) 5 MG tablet Take 1 tablet (5 mg total) by mouth daily. 30 tablet 3  . hydrochlorothiazide (MICROZIDE) 12.5 MG capsule Take 1 capsule (12.5 mg total) by mouth daily. 30 capsule 3  . insulin glargine (LANTUS) 100 UNIT/ML injection Inject 0.23 mLs (23 Units total) into  the skin at bedtime. 10 mL 3  . metoprolol (LOPRESSOR) 50 MG tablet Take 1 tablet (50 mg total) by mouth 2 (two) times daily. 60 tablet 2  . simvastatin (ZOCOR) 10 MG tablet Take 10 mg by mouth daily.     . ondansetron (ZOFRAN) 4 MG tablet Take 1 tablet (4 mg total) by mouth every 6 (six) hours. (Patient not taking: Reported on 06/16/2015) 12 tablet 0   No facility-administered medications prior to visit.    ROS Review of Systems  Constitutional: Negative for activity change, appetite change and fatigue.  HENT: Negative for congestion, sinus pressure and sore throat.   Eyes: Negative for visual disturbance.  Respiratory: Negative for cough, chest tightness, shortness of breath and wheezing.   Cardiovascular: Negative for chest pain and palpitations.  Gastrointestinal: Negative for abdominal pain, constipation and abdominal distention.  Endocrine: Negative for polydipsia.  Genitourinary: Negative for dysuria and frequency.  Musculoskeletal: Negative for back pain and arthralgias.  Skin: Negative for rash.  Neurological: Negative for tremors, light-headedness and numbness.  Hematological: Does not bruise/bleed easily.  Psychiatric/Behavioral: Negative for behavioral problems and agitation.    Objective:  BP 155/105 mmHg  Pulse 71  Temp(Src) 98.5 F (36.9 C) (Oral)  Resp 16  Ht _0  (1.626 m)  Wt 176 lb 9.6 oz (80.105 kg)  BMI 30.30 kg/m2  SpO2 99%  BP/Weight 07/24/2015 06/16/2015  4/88/8916  Systolic BP 945 038 882  Diastolic BP 800 97 85  Wt. (Lbs) 176.6 169.2 166  BMI 30.3 28.16 27.62      Physical Exam  Constitutional: She is oriented to person, place, and time. She appears well-developed and well-nourished. No distress.  HENT:  Head: Normocephalic.  Right Ear: External ear normal.  Left Ear: External ear normal.  Nose: Nose normal.  Mouth/Throat: Oropharynx is clear and moist.  Eyes: Conjunctivae and EOM are normal. Pupils are equal, round, and reactive to light.    Neck: Normal range of motion. No JVD present.  Cardiovascular: Normal rate, regular rhythm, normal heart sounds and intact distal pulses.  Exam reveals no gallop.   No murmur heard. Pulmonary/Chest: Effort normal and breath sounds normal. No respiratory distress. She has no wheezes. She has no rales. She exhibits no tenderness.  Abdominal: Soft. Bowel sounds are normal. She exhibits no distension and no mass. There is no tenderness.  Genitourinary: Vagina normal and uterus normal. No breast swelling (no palpable lumps) or tenderness. No vaginal discharge found.  Cervix: Normal, no cervical motion tenderness  Musculoskeletal: Normal range of motion. She exhibits no edema or tenderness.  Neurological: She is alert and oriented to person, place, and time. She has normal reflexes.  Skin: Skin is warm and dry. She is not diaphoretic.  Psychiatric: She has a normal mood and affect.     Assessment & Plan:   1. 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  Dispense: 30 each; Refill: 5 - COMPLETE METABOLIC PANEL WITH GFR; Future - Microalbumin / creatinine urine ratio; Future - Hemoglobin A1c; Future - insulin glargine (LANTUS) 100 UNIT/ML injection; Inject 0.28 mLs (28 Units total) into the skin at bedtime.  Dispense: 10 mL; Refill: 3 - amLODipine (NORVASC) 5 MG tablet; Take 1 tablet (5 mg total) by mouth daily.  Dispense: 30 tablet; Refill: 3 - metoprolol (LOPRESSOR) 50 MG tablet; Take 1 tablet (50 mg total) by mouth 2 (two) times daily.  Dispense: 60 tablet; Refill: 3  2. Asthma, mild  intermittent, uncomplicated Stable - albuterol (PROVENTIL HFA;VENTOLIN HFA) 108 (90 Base) MCG/ACT inhaler; Inhale 1-2 puffs into the lungs every 6 (six) hours as needed for wheezing or shortness of breath.  Dispense: 1 Inhaler; Refill: 1  3. Essential hypertension Uncontrolled This improved dose increased to 25 mg - hydrochlorothiazide (HYDRODIURIL) 25 MG tablet; Take 1 tablet (25 mg total) by mouth daily.  Dispense: 30 tablet; Refill: 3  4. Type 2 diabetes mellitus with complication, with long-term current use of insulin (HCC) Uncontrolled Lantus increased to 28 units - insulin glargine (LANTUS) 100 UNIT/ML injection; Inject 0.28 mLs (28 Units total) into the skin at bedtime.  Dispense: 10 mL; Refill: 3  5. Encounter for routine history and physical exam in female patient Up-to-date on mammogram Colonoscopy on hold as per GI due to Diabetic gastroparesis - Cytology - PAP - HIV antibody (with reflex); Future  6. Hyperlipidemia - Lipid panel; Future - simvastatin (ZOCOR) 10 MG tablet; Take 1 tablet (10 mg total) by mouth daily.  Dispense: 30 tablet;  Refill: 3   Meds ordered this encounter  Medications  . Insulin Syringe-Needle U-100 (RELION INSULIN SYRINGE) 31G X 15/64" 0.5 ML MISC    Sig: Use as directed daily at bedtime    Dispense:  30 each    Refill:  5  . glucose blood (RELION GLUCOSE TEST STRIPS) test strip    Sig: Use as instructed    Dispense:  100 each    Refill:  5  . RELION LANCETS MICRO-THIN 33G MISC    Sig: 1 each by Does not apply route at bedtime.    Dispense:  30 each    Refill:  5  . albuterol (PROVENTIL HFA;VENTOLIN HFA) 108 (90 Base) MCG/ACT inhaler    Sig: Inhale 1-2 puffs into the lungs every 6 (six) hours as needed for wheezing or shortness of breath.    Dispense:  1 Inhaler    Refill:  1  . simvastatin (ZOCOR) 10 MG tablet    Sig: Take 1 tablet (10 mg total) by mouth daily.    Dispense:  30 tablet    Refill:  3  . hydrochlorothiazide (HYDRODIURIL)  25 MG tablet    Sig: Take 1 tablet (25 mg total) by mouth daily.    Dispense:  30 tablet    Refill:  3    Discontinue previous dose  . insulin glargine (LANTUS) 100 UNIT/ML injection    Sig: Inject 0.28 mLs (28 Units total) into the skin at bedtime.    Dispense:  10 mL    Refill:  3    Discontinue previous dose  . amLODipine (NORVASC) 5 MG tablet    Sig: Take 1 tablet (5 mg total) by mouth daily.    Dispense:  30 tablet    Refill:  3  . metoprolol (LOPRESSOR) 50 MG tablet    Sig: Take 1 tablet (50 mg total) by mouth 2 (two) times daily.    Dispense:  60 tablet    Refill:  3    Follow-up: Return in about 3 months (around 10/24/2015) for Follow-up on diabetes mellitus.   Arnoldo Morale MD

## 2015-07-24 NOTE — Progress Notes (Signed)
Pt here for a physical. Pt denies any pain. Pt has taken medications this morning. Pt needs refills on syringes, lancets for ReliOn prime meter, and test strips, benazepril, simvastatin, glimiperide, HCTZ, Klor-CON, gabapentin, inhaler  . Pt questions how she is suppose to be taking her BP medication. Pt CBG is 180 fasting.

## 2015-07-24 NOTE — Telephone Encounter (Signed)
Pt has been set up for follow with Dr Myrtie Neitheranis on 09/04/15 and will continue to take omeprazole 20 mg daily

## 2015-07-30 LAB — CYTOLOGY - PAP

## 2015-08-13 ENCOUNTER — Telehealth: Payer: Self-pay | Admitting: Family Medicine

## 2015-08-13 NOTE — Telephone Encounter (Signed)
Her May 25th appointment was a physical and I received no FMLA paperwork from her. She does have a scanned FMLA paperwork from 05/2015

## 2015-08-13 NOTE — Telephone Encounter (Signed)
Patient dropped off FMLA paperwork to be completed by doctor. May 25th [facility] Appointment. Patient needs paperwork as soon as possible.   Please follow up.

## 2015-08-13 NOTE — Telephone Encounter (Signed)
New paperwork dropped off today.  The scanned paperwork from 05/2015 has expired.  New paperwork placed on MD's desk.

## 2015-08-19 ENCOUNTER — Telehealth: Payer: Self-pay

## 2015-08-19 NOTE — Telephone Encounter (Signed)
Jory Eeody Arndt called from [employer] regarding patient's LOA forms.  Question # 7 wasn't filled in regarding number of episodes, duration and weekly/monthly.  This RN advised examiner to place 2-3 times weekly every week.  If the paperwork needs to be revised, it was recommended that he sends new forms.  Selena BattenCody was unable to move forward with this area blank.

## 2015-09-04 ENCOUNTER — Ambulatory Visit (INDEPENDENT_AMBULATORY_CARE_PROVIDER_SITE_OTHER): Payer: BLUE CROSS/BLUE SHIELD | Admitting: Gastroenterology

## 2015-09-04 ENCOUNTER — Encounter: Payer: Self-pay | Admitting: Gastroenterology

## 2015-09-04 VITALS — BP 120/80 | HR 80 | Ht 64.0 in | Wt 175.6 lb

## 2015-09-04 DIAGNOSIS — R110 Nausea: Secondary | ICD-10-CM

## 2015-09-04 DIAGNOSIS — K257 Chronic gastric ulcer without hemorrhage or perforation: Secondary | ICD-10-CM

## 2015-09-04 DIAGNOSIS — Z1211 Encounter for screening for malignant neoplasm of colon: Secondary | ICD-10-CM | POA: Diagnosis not present

## 2015-09-04 DIAGNOSIS — K219 Gastro-esophageal reflux disease without esophagitis: Secondary | ICD-10-CM

## 2015-09-04 MED ORDER — NA SULFATE-K SULFATE-MG SULF 17.5-3.13-1.6 GM/177ML PO SOLN: 1.0000 | Freq: Once | ORAL | Status: DC

## 2015-09-04 MED ORDER — ONDANSETRON HCL 4 MG PO TABS: 4.0000 mg | ORAL_TABLET | Freq: Once | ORAL | Status: DC

## 2015-09-04 NOTE — Progress Notes (Signed)
[facility] Progress Note  Chief Complaint: Gastric ulcer  Subjective History:  Now just has occ'l nausea .  Glucose still under suboptimal control, by her report. Denies rectal bleeding.Denies epigastric pain. Now feels ready for screening colonoscopy.  Still taking prilosec 40 mg once daily. ROS: Cardiovascular:  no chest pain Respiratory: no dyspnea  The patient's Past Medical, Family and Social History were reviewed and are on file in the EMR.  Objective:  Med list reviewed  Vital signs in last 24 hrs: Filed Vitals:   09/04/15 1323  BP: 120/80  Pulse: 80    Physical Exam   HEENT: sclera anicteric, oral mucosa moist without lesions  Neck: supple, no thyromegaly, JVD or lymphadenopathy  Cardiac: RRR without murmurs, S1S2 heard, no peripheral edema  Pulm: clear to auscultation bilaterally, normal RR and effort noted  Abdomen: soft, no tenderness, with active bowel sounds. No guarding or palpable hepatosplenomegaly.  Skin; warm and dry, no jaundice or rash  Recent Labs:  Gastric Bx neg for malignancy or H. pylori    @ASSESSMENTPLANBEGIN @ Assessment: Encounter Diagnoses  Name Primary?  . Chronic gastric ulcer Yes  . Nausea without vomiting   . Special screening for malignant neoplasms, colon   . Gastroesophageal reflux disease without esophagitis     Plan: Change PPI back to ranitidine EGD to assess ulcer healing (she denies having taken NSAIDs or ASA, ? Cause) Screening colonoscopy. Keep working on glucose control.  Total time 20 minutes, over half spent in counseling regarding the above diagnoses.  Most notably the need for good long term glucose control.  I think she has mild diabetic gastropathy that was flared by an acute illness, now under control. Charlie PitterHenry L Danis III

## 2015-09-04 NOTE — Patient Instructions (Signed)
You have been scheduled for a colonoscopy/Endo. Please follow written instructions given to you at your visit today.  Please pick up your prep supplies at the pharmacy within the next 1-3 days. If you use inhalers (even only as needed), please bring them with you on the day of your procedure. Your physician has requested that you go to www.startemmi.com and enter the access code given to you at your visit today. This web site gives a general overview about your procedure. However, you should still follow specific instructions given to you by our office regarding your preparation for the procedure.  If you are age 54 or older, your body mass index should be between 23-30. Your Body mass index is 30.13 kg/(m^2). If this is out of the aforementioned range listed, please consider follow up with your Primary Care Provider.  If you are age 54 or younger, your body mass index should be between 19-25. Your Body mass index is 30.13 kg/(m^2). If this is out of the aformentioned range listed, please consider follow up with your Primary Care Provider.   Thank you for choosing [facility]  Dr Amada JupiterHenry Danis III

## 2015-09-17 ENCOUNTER — Encounter: Payer: Self-pay | Admitting: Gastroenterology

## 2015-10-01 ENCOUNTER — Ambulatory Visit (AMBULATORY_SURGERY_CENTER): Payer: BLUE CROSS/BLUE SHIELD | Admitting: Gastroenterology

## 2015-10-01 ENCOUNTER — Encounter: Payer: Self-pay | Admitting: Gastroenterology

## 2015-10-01 VITALS — BP 152/87 | HR 68 | Temp 98.4°F | Resp 12 | Ht 64.0 in | Wt 175.0 lb

## 2015-10-01 DIAGNOSIS — Z1211 Encounter for screening for malignant neoplasm of colon: Secondary | ICD-10-CM

## 2015-10-01 DIAGNOSIS — K257 Chronic gastric ulcer without hemorrhage or perforation: Secondary | ICD-10-CM

## 2015-10-01 LAB — GLUCOSE, CAPILLARY
GLUCOSE-CAPILLARY: 138 mg/dL — AB (ref 65–99)
GLUCOSE-CAPILLARY: 102 mg/dL — AB (ref 65–99)

## 2015-10-01 MED ORDER — SODIUM CHLORIDE 0.9 % IV SOLN: 500.0000 mL | INTRAVENOUS | Status: DC

## 2015-10-01 NOTE — Op Note (Signed)
[facility] Patient Name: Kelly Santana Procedure Date: 10/01/2015 3:16 PM MRN: [id number] Endoscopist: Sherilyn Cooter L. Myrtie Neither , MD Age: 55 Referring MD:  Date of Birth: 1960/09/13 Gender: Female Account #: [id number] Procedure:                Colonoscopy Indications:              Screening for colorectal malignant neoplasm, This                            is the patient's first colonoscopy Medicines:                Monitored Anesthesia Care Procedure:                Pre-Anesthesia Assessment:                           - Prior to the procedure, a History and Physical                            was performed, and patient medications and                            allergies were reviewed. The patient's tolerance of                            previous anesthesia was also reviewed. The risks                            and benefits of the procedure and the sedation                            options and risks were discussed with the patient.                            All questions were answered, and informed consent                            was obtained. Prior Anticoagulants: The patient has                            taken no previous anticoagulant or antiplatelet                            agents. ASA Grade Assessment: II - A patient with                            mild systemic disease. After reviewing the risks                            and benefits, the patient was deemed in                            satisfactory condition to undergo the procedure.                           -  Prior to the procedure, a History and Physical                            was performed, and patient medications and                            allergies were reviewed. The patient's tolerance of                            previous anesthesia was also reviewed. The risks                            and benefits of the procedure and the sedation                            options and risks were discussed  with the patient.                            All questions were answered, and informed consent                            was obtained. Prior Anticoagulants: The patient has                            taken no previous anticoagulant or antiplatelet                            agents. ASA Grade Assessment: II - A patient with                            mild systemic disease. After reviewing the risks                            and benefits, the patient was deemed in                            satisfactory condition to undergo the procedure.                           After obtaining informed consent, the colonoscope                            was passed under direct vision. Throughout the                            procedure, the patient's blood pressure, pulse, and                            oxygen saturations were monitored continuously. The                            Model CF-HQ190L [id number]) scope was introduced  through the anus and advanced to the the cecum,                            identified by appendiceal orifice and ileocecal                            valve. The colonoscopy was performed without                            difficulty. The patient tolerated the procedure                            well. The quality of the bowel preparation was                            excellent. The ileocecal valve, appendiceal                            orifice, and rectum were photographed. Scope In: 3:38:06 PM Scope Out: 3:49:59 PM Scope Withdrawal Time: 0 hours 7 minutes 39 seconds  Total Procedure Duration: 0 hours 11 minutes 53 seconds  Findings:                 The perianal and digital rectal examinations were                            normal.                           A few medium-mouthed diverticula were found in the                            hepatic flexure.                           The exam was otherwise without abnormality on                             direct and retroflexion views. Complications:            No immediate complications. Estimated Blood Loss:     Estimated blood loss: none. Impression:               - Diverticulosis at the hepatic flexure.                           - The examination was otherwise normal on direct                            and retroflexion views.                           - No specimens collected. Recommendation:           - Patient has a contact number available for  emergencies. The signs and symptoms of potential                            delayed complications were discussed with the                            patient. Return to normal activities tomorrow.                            Written discharge instructions were provided to the                            patient.                           - Resume previous diet.                           - Continue present medications.                           - Repeat colonoscopy in 10 years for screening                            purposes. Henry L. Myrtie Neither, MD 10/01/2015 3:57:50 PM This report has been signed electronically.

## 2015-10-01 NOTE — Patient Instructions (Signed)
YOU HAD AN ENDOSCOPIC PROCEDURE TODAY AT THE [facility]:   Refer to the procedure report that was given to you for any specific questions about what was found during the examination.  If the procedure report does not answer your questions, please call your gastroenterologist to clarify.  If you requested that your care partner not be given the details of your procedure findings, then the procedure report has been included in a sealed envelope for you to review at your convenience later.  YOU SHOULD EXPECT: Some feelings of bloating in the abdomen. Passage of more gas than usual.  Walking can help get rid of the air that was put into your GI tract during the procedure and reduce the bloating. If you had a lower endoscopy (such as a colonoscopy or flexible sigmoidoscopy) you may notice spotting of blood in your stool or on the toilet paper. If you underwent a bowel prep for your procedure, you may not have a normal bowel movement for a few days.  Please Note:  You might notice some irritation and congestion in your nose or some drainage.  This is from the oxygen used during your procedure.  There is no need for concern and it should clear up in a day or so.  SYMPTOMS TO REPORT IMMEDIATELY:   Following lower endoscopy (colonoscopy or flexible sigmoidoscopy):  Excessive amounts of blood in the stool  Significant tenderness or worsening of abdominal pains  Swelling of the abdomen that is new, acute  Fever of 100F or higher   Following upper endoscopy (EGD)  Vomiting of blood or coffee ground material  New chest pain or pain under the shoulder blades  Painful or persistently difficult swallowing  New shortness of breath  Fever of 100F or higher  Black, tarry-looking stools  For urgent or emergent issues, a gastroenterologist can be reached at any hour by calling [phone].   DIET: Your first meal following the procedure should be a small meal and then it is ok to progress to  your normal diet. Heavy or fried foods are harder to digest and may make you feel nauseous or bloated.  Likewise, meals heavy in dairy and vegetables can increase bloating.  Drink plenty of fluids but you should avoid alcoholic beverages for 24 hours.  ACTIVITY:  You should plan to take it easy for the rest of today and you should NOT DRIVE or use heavy machinery until tomorrow (because of the sedation medicines used during the test).    FOLLOW UP: Our staff will call the number listed on your records the next business day following your procedure to check on you and address any questions or concerns that you may have regarding the information given to you following your procedure. If we do not reach you, we will leave a message.  However, if you are feeling well and you are not experiencing any problems, there is no need to return our call.  We will assume that you have returned to your regular daily activities without incident.  If any biopsies were taken you will be contacted by phone or by letter within the next 1-3 weeks.  Please call us at [phone] if you have not heard about the biopsies in 3 weeks.    SIGNATURES/CONFIDENTIALITY: You and/or your care partner have signed paperwork which will be entered into your electronic medical record.  These signatures attest to the fact that that the information above on your After Visit Summary has been reviewed   and is understood.  Full responsibility of the confidentiality of this discharge information lies with you and/or your care-partner. 

## 2015-10-01 NOTE — Op Note (Signed)
[facility] Patient Name: Kelly Santana Procedure Date: 10/01/2015 3:16 PM MRN: [id number] Endoscopist: Sherilyn Cooter L. Myrtie Neither , MD Age: 55 Referring MD:  Date of Birth: 1960/10/17 Gender: Female Account #: [id number] Procedure:                Upper GI endoscopy Indications:              Follow-up of chronic gastric ulcer Medicines:                Monitored Anesthesia Care Procedure:                Pre-Anesthesia Assessment:                           - Prior to the procedure, a History and Physical                            was performed, and patient medications and                            allergies were reviewed. The patient's tolerance of                            previous anesthesia was also reviewed. The risks                            and benefits of the procedure and the sedation                            options and risks were discussed with the patient.                            All questions were answered, and informed consent                            was obtained. Prior Anticoagulants: The patient has                            taken no previous anticoagulant or antiplatelet                            agents. ASA Grade Assessment: II - A patient with                            mild systemic disease. After reviewing the risks                            and benefits, the patient was deemed in                            satisfactory condition to undergo the procedure.                           After obtaining informed consent, the endoscope was  passed under direct vision. Throughout the                            procedure, the patient's blood pressure, pulse, and                            oxygen saturations were monitored continuously. The                            Model [id number]-[id number]) scope was introduced                            through the mouth, and advanced to the second part                            of duodenum. The  upper GI endoscopy was                            accomplished without difficulty. The patient                            tolerated the procedure well. Scope In: Scope Out: Findings:                 The esophagus was normal.                           The stomach was normal. The prior ulcer has healed.                           The cardia and gastric fundus were normal on                            retroflexion.                           The examined duodenum was normal. Complications:            No immediate complications. Estimated Blood Loss:     Estimated blood loss: none. Impression:               - Normal esophagus.                           - Normal stomach.                           - Normal examined duodenum.                           - No specimens collected. Recommendation:           - Patient has a contact number available for                            emergencies. The signs and symptoms of potential  delayed complications were discussed with the                            patient. Return to normal activities tomorrow.                            Written discharge instructions were provided to the                            patient.                           - Resume previous diet.                           - Continue present medications.                           - See the other procedure note for documentation of                            additional recommendations. Walida Cajas L. Myrtie Neither, MD 10/01/2015 3:55:37 PM This report has been signed electronically.

## 2015-10-01 NOTE — Progress Notes (Signed)
Report to PACU, RN, vss, BBS= Clear.  

## 2015-10-01 NOTE — Progress Notes (Signed)
No egg or soy allergy known to patient  No issues with past sedation with any surgeries  or procedures, no intubation problems  No diet pills per patient No home 02 use per patient  No blood thinners per patient  Pt denies issues with constipation   

## 2015-10-02 ENCOUNTER — Telehealth: Payer: Self-pay | Admitting: *Deleted

## 2015-10-02 NOTE — Telephone Encounter (Signed)
  Follow up Call-  Call back number 10/01/2015 06/15/2015  Post procedure Call Back phone  # [phone]  Permission to leave phone message Yes Yes  Some recent data might be hidden     Patient questions:  Do you have a fever, pain , or abdominal swelling? No. Pain Score  0 *  Have you tolerated food without any problems? Yes.    Have you been able to return to your normal activities? Yes.    Do you have any questions about your discharge instructions: Diet   No. Medications  No. Follow up visit  No.  Do you have questions or concerns about your Care? No.  Actions: * If pain score is 4 or above: No action needed, pain <4.

## 2015-10-20 ENCOUNTER — Encounter: Payer: Self-pay | Admitting: Family Medicine

## 2015-10-20 ENCOUNTER — Ambulatory Visit: Payer: BLUE CROSS/BLUE SHIELD | Attending: Family Medicine | Admitting: Family Medicine

## 2015-10-20 VITALS — BP 133/85 | HR 73 | Temp 98.3°F | Ht 65.0 in | Wt 180.2 lb

## 2015-10-20 DIAGNOSIS — E1140 Type 2 diabetes mellitus with diabetic neuropathy, unspecified: Secondary | ICD-10-CM | POA: Insufficient documentation

## 2015-10-20 DIAGNOSIS — K219 Gastro-esophageal reflux disease without esophagitis: Secondary | ICD-10-CM | POA: Insufficient documentation

## 2015-10-20 DIAGNOSIS — E1149 Type 2 diabetes mellitus with other diabetic neurological complication: Secondary | ICD-10-CM | POA: Diagnosis not present

## 2015-10-20 DIAGNOSIS — E785 Hyperlipidemia, unspecified: Secondary | ICD-10-CM | POA: Insufficient documentation

## 2015-10-20 DIAGNOSIS — I10 Essential (primary) hypertension: Secondary | ICD-10-CM | POA: Diagnosis not present

## 2015-10-20 DIAGNOSIS — Z794 Long term (current) use of insulin: Secondary | ICD-10-CM | POA: Insufficient documentation

## 2015-10-20 DIAGNOSIS — J45909 Unspecified asthma, uncomplicated: Secondary | ICD-10-CM | POA: Insufficient documentation

## 2015-10-20 DIAGNOSIS — J301 Allergic rhinitis due to pollen: Secondary | ICD-10-CM | POA: Insufficient documentation

## 2015-10-20 DIAGNOSIS — E118 Type 2 diabetes mellitus with unspecified complications: Secondary | ICD-10-CM | POA: Diagnosis not present

## 2015-10-20 DIAGNOSIS — Z79899 Other long term (current) drug therapy: Secondary | ICD-10-CM | POA: Insufficient documentation

## 2015-10-20 DIAGNOSIS — K253 Acute gastric ulcer without hemorrhage or perforation: Secondary | ICD-10-CM | POA: Diagnosis not present

## 2015-10-20 DIAGNOSIS — Z9889 Other specified postprocedural states: Secondary | ICD-10-CM | POA: Diagnosis not present

## 2015-10-20 DIAGNOSIS — J4520 Mild intermittent asthma, uncomplicated: Secondary | ICD-10-CM

## 2015-10-20 DIAGNOSIS — G4700 Insomnia, unspecified: Secondary | ICD-10-CM

## 2015-10-20 LAB — POCT GLYCOSYLATED HEMOGLOBIN (HGB A1C): Hemoglobin A1C: 10.3

## 2015-10-20 LAB — GLUCOSE, POCT (MANUAL RESULT ENTRY): POC GLUCOSE: 238 mg/dL — AB (ref 70–99)

## 2015-10-20 MED ORDER — HYDROCHLOROTHIAZIDE 25 MG PO TABS: 25.0000 mg | ORAL_TABLET | Freq: Every day | ORAL | 3 refills | Status: DC

## 2015-10-20 MED ORDER — SIMVASTATIN 10 MG PO TABS: 10.0000 mg | ORAL_TABLET | Freq: Every day | ORAL | 3 refills | Status: DC

## 2015-10-20 MED ORDER — FLUTICASONE PROPIONATE 50 MCG/ACT NA SUSP: 2.0000 | Freq: Every day | NASAL | 3 refills | Status: DC

## 2015-10-20 MED ORDER — KLOR-CON M20 20 MEQ PO TBCR: 20.0000 meq | EXTENDED_RELEASE_TABLET | Freq: Every day | ORAL | 3 refills | Status: DC

## 2015-10-20 MED ORDER — AMLODIPINE BESYLATE 5 MG PO TABS: 5.0000 mg | ORAL_TABLET | Freq: Every day | ORAL | 3 refills | Status: DC

## 2015-10-20 MED ORDER — BENAZEPRIL HCL 40 MG PO TABS: 40.0000 mg | ORAL_TABLET | Freq: Every day | ORAL | 3 refills | Status: DC

## 2015-10-20 MED ORDER — METOPROLOL TARTRATE 50 MG PO TABS: 50.0000 mg | ORAL_TABLET | Freq: Two times a day (BID) | ORAL | 3 refills | Status: DC

## 2015-10-20 MED ORDER — OMEPRAZOLE 20 MG PO CPDR: 20.0000 mg | DELAYED_RELEASE_CAPSULE | Freq: Every day | ORAL | 3 refills | Status: DC

## 2015-10-20 MED ORDER — LORATADINE 10 MG PO TABS: 10.0000 mg | ORAL_TABLET | Freq: Every day | ORAL | 3 refills | Status: DC

## 2015-10-20 MED ORDER — GABAPENTIN 300 MG PO CAPS: 300.0000 mg | ORAL_CAPSULE | Freq: Every day | ORAL | 3 refills | Status: DC

## 2015-10-20 MED ORDER — GLIMEPIRIDE 4 MG PO TABS: 4.0000 mg | ORAL_TABLET | Freq: Two times a day (BID) | ORAL | 3 refills | Status: DC

## 2015-10-20 MED ORDER — INSULIN GLARGINE 100 UNIT/ML ~~LOC~~ SOLN: 35.0000 [IU] | Freq: Every day | SUBCUTANEOUS | 3 refills | Status: DC

## 2015-10-20 NOTE — Patient Instructions (Signed)
Allergic Rhinitis Allergic rhinitis is when the mucous membranes in the nose respond to allergens. Allergens are particles in the air that cause your body to have an allergic reaction. This causes you to release allergic antibodies. Through a chain of events, these eventually cause you to release histamine into the blood stream. Although meant to protect the body, it is this release of histamine that causes your discomfort, such as frequent sneezing, congestion, and an itchy, runny nose.  CAUSES Seasonal allergic rhinitis (hay fever) is caused by pollen allergens that may come from grasses, trees, and weeds. Year-round allergic rhinitis (perennial allergic rhinitis) is caused by allergens such as house dust mites, pet dander, and mold spores. SYMPTOMS  Nasal stuffiness (congestion).  Itchy, runny nose with sneezing and tearing of the eyes. DIAGNOSIS Your health care provider can help you determine the allergen or allergens that trigger your symptoms. If you and your health care provider are unable to determine the allergen, skin or blood testing may be used. Your health care provider will diagnose your condition after taking your health history and performing a physical exam. Your health care provider may assess you for other related conditions, such as asthma, pink eye, or an ear infection. TREATMENT Allergic rhinitis does not have a cure, but it can be controlled by:  Medicines that block allergy symptoms. These may include allergy shots, nasal sprays, and oral antihistamines.  Avoiding the allergen. Hay fever may often be treated with antihistamines in pill or nasal spray forms. Antihistamines block the effects of histamine. There are over-the-counter medicines that may help with nasal congestion and swelling around the eyes. Check with your health care provider before taking or giving this medicine. If avoiding the allergen or the medicine prescribed do not work, there are many new medicines  your health care provider can prescribe. Stronger medicine may be used if initial measures are ineffective. Desensitizing injections can be used if medicine and avoidance does not work. Desensitization is when a patient is given ongoing shots until the body becomes less sensitive to the allergen. Make sure you follow up with your health care provider if problems continue. HOME CARE INSTRUCTIONS It is not possible to completely avoid allergens, but you can reduce your symptoms by taking steps to limit your exposure to them. It helps to know exactly what you are allergic to so that you can avoid your specific triggers. SEEK MEDICAL CARE IF:  You have a fever.  You develop a cough that does not stop easily (persistent).  You have shortness of breath.  You start wheezing.  Symptoms interfere with normal daily activities.   This information is not intended to replace advice given to you by your health care provider. Make sure you discuss any questions you have with your health care provider.   Document Released: 11/09/2000 Document Revised: 03/07/2014 Document Reviewed: 10/22/2012 Elsevier Interactive Patient Education 2016 [employer]  
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
## 2015-10-20 NOTE — Progress Notes (Signed)
Insomnia- poor sleep pattern Allergies bad- itchy eyes, sneezing- claritin not "really working'

## 2015-10-23 ENCOUNTER — Telehealth: Payer: Self-pay | Admitting: Family Medicine

## 2015-10-23 NOTE — Telephone Encounter (Signed)
Pt. Called stating that when she came in for her appointment her PCP had informed her to stop taking amlodipine. Pt. Is not sure if she needs to continue to take it or stop taking it. Please f/u

## 2015-10-27 ENCOUNTER — Other Ambulatory Visit: Payer: BLUE CROSS/BLUE SHIELD

## 2015-10-27 NOTE — Telephone Encounter (Signed)
She should be taking amlodipine 5 mg.

## 2015-10-28 NOTE — Telephone Encounter (Signed)
Call patient at [phone] Left message for patient;  When patient calls please advise: yes she should be taking amlodipine 5mg . Pollyann KennedyKim Becton, RN, BSN

## 2015-11-03 NOTE — Telephone Encounter (Signed)
Patient is still confused about what she should be taking. pLease follow up.

## 2015-11-04 NOTE — Telephone Encounter (Signed)
Left message advising patient per Dr. Venetia NightAmao: yes she should be taking amlodipine. And if any additional questions please callback and provide information so we can discuss with Dr. Venetia NightAmao if needed.  Pollyann KennedyKim Aaden Buckman, RN, BSN

## 2015-12-23 ENCOUNTER — Ambulatory Visit: Payer: BLUE CROSS/BLUE SHIELD | Attending: Family Medicine | Admitting: Family Medicine

## 2015-12-23 ENCOUNTER — Other Ambulatory Visit (HOSPITAL_COMMUNITY)
Admission: RE | Admit: 2015-12-23 | Discharge: 2015-12-23 | Disposition: A | Discharge status: Home / Self Care | Payer: BLUE CROSS/BLUE SHIELD | Source: Ambulatory Visit | Attending: Family Medicine | Admitting: Family Medicine

## 2015-12-23 ENCOUNTER — Ambulatory Visit: Payer: BLUE CROSS/BLUE SHIELD

## 2015-12-23 ENCOUNTER — Encounter: Payer: Self-pay | Admitting: Family Medicine

## 2015-12-23 VITALS — BP 143/84 | HR 78 | Temp 98.2°F | Ht 65.0 in | Wt 184.6 lb

## 2015-12-23 DIAGNOSIS — N898 Other specified noninflammatory disorders of vagina: Secondary | ICD-10-CM | POA: Insufficient documentation

## 2015-12-23 DIAGNOSIS — K3184 Gastroparesis: Secondary | ICD-10-CM | POA: Diagnosis not present

## 2015-12-23 DIAGNOSIS — J45909 Unspecified asthma, uncomplicated: Secondary | ICD-10-CM | POA: Diagnosis not present

## 2015-12-23 DIAGNOSIS — N760 Acute vaginitis: Secondary | ICD-10-CM | POA: Diagnosis present

## 2015-12-23 DIAGNOSIS — Z79899 Other long term (current) drug therapy: Secondary | ICD-10-CM | POA: Insufficient documentation

## 2015-12-23 DIAGNOSIS — F411 Generalized anxiety disorder: Secondary | ICD-10-CM | POA: Insufficient documentation

## 2015-12-23 DIAGNOSIS — E1143 Type 2 diabetes mellitus with diabetic autonomic (poly)neuropathy: Secondary | ICD-10-CM | POA: Diagnosis not present

## 2015-12-23 DIAGNOSIS — Z0000 Encounter for general adult medical examination without abnormal findings: Secondary | ICD-10-CM

## 2015-12-23 DIAGNOSIS — K253 Acute gastric ulcer without hemorrhage or perforation: Secondary | ICD-10-CM | POA: Diagnosis not present

## 2015-12-23 DIAGNOSIS — E0800 Diabetes mellitus due to underlying condition with hyperosmolarity without nonketotic hyperglycemic-hyperosmolar coma (NKHHC): Secondary | ICD-10-CM | POA: Diagnosis not present

## 2015-12-23 DIAGNOSIS — Z113 Encounter for screening for infections with a predominantly sexual mode of transmission: Secondary | ICD-10-CM | POA: Insufficient documentation

## 2015-12-23 DIAGNOSIS — Z794 Long term (current) use of insulin: Secondary | ICD-10-CM | POA: Insufficient documentation

## 2015-12-23 DIAGNOSIS — G5602 Carpal tunnel syndrome, left upper limb: Secondary | ICD-10-CM | POA: Diagnosis not present

## 2015-12-23 DIAGNOSIS — E1149 Type 2 diabetes mellitus with other diabetic neurological complication: Secondary | ICD-10-CM

## 2015-12-23 DIAGNOSIS — E118 Type 2 diabetes mellitus with unspecified complications: Secondary | ICD-10-CM

## 2015-12-23 DIAGNOSIS — M25511 Pain in right shoulder: Secondary | ICD-10-CM | POA: Insufficient documentation

## 2015-12-23 DIAGNOSIS — I10 Essential (primary) hypertension: Secondary | ICD-10-CM | POA: Insufficient documentation

## 2015-12-23 DIAGNOSIS — E119 Type 2 diabetes mellitus without complications: Secondary | ICD-10-CM | POA: Diagnosis present

## 2015-12-23 DIAGNOSIS — E7800 Pure hypercholesterolemia, unspecified: Secondary | ICD-10-CM | POA: Diagnosis not present

## 2015-12-23 LAB — COMPLETE METABOLIC PANEL WITH GFR
ALBUMIN: 4.1 g/dL (ref 3.6–5.1)
ALT: 21 U/L (ref 6–29)
AST: 22 U/L (ref 10–35)
Alkaline Phosphatase: 64 U/L (ref 33–130)
BILIRUBIN TOTAL: 0.8 mg/dL (ref 0.2–1.2)
BUN: 18 mg/dL (ref 7–25)
CALCIUM: 9.1 mg/dL (ref 8.6–10.4)
CO2: 27 mmol/L (ref 20–31)
CREATININE: 1.09 mg/dL — AB (ref 0.50–1.05)
Chloride: 98 mmol/L (ref 98–110)
GFR, EST AFRICAN AMERICAN: 67 mL/min (ref >=60)
GFR, Est Non African American: 58 mL/min — ABNORMAL LOW (ref >=60)
Glucose, Bld: 268 mg/dL — ABNORMAL HIGH (ref 65–99)
Potassium: 3.9 mmol/L (ref 3.5–5.3)
Sodium: 137 mmol/L (ref 135–146)
TOTAL PROTEIN: 6.7 g/dL (ref 6.1–8.1)

## 2015-12-23 LAB — LIPID PANEL
CHOLESTEROL: 167 mg/dL (ref 125–200)
HDL: 36 mg/dL — ABNORMAL LOW (ref >=46)
LDL Cholesterol: 85 mg/dL (ref <130)
TRIGLYCERIDES: 231 mg/dL — AB (ref <150)
Total CHOL/HDL Ratio: 4.6 Ratio (ref <=5.0)
VLDL: 46 mg/dL — AB (ref <30)

## 2015-12-23 LAB — GLUCOSE, POCT (MANUAL RESULT ENTRY): POC Glucose: 300 mg/dl — AB (ref 70–99)

## 2015-12-23 MED ORDER — GLIMEPIRIDE 4 MG PO TABS: 4.0000 mg | ORAL_TABLET | Freq: Two times a day (BID) | ORAL | 1 refills | Status: DC

## 2015-12-23 MED ORDER — HYDROCHLOROTHIAZIDE 25 MG PO TABS: 25.0000 mg | ORAL_TABLET | Freq: Every day | ORAL | 1 refills | Status: DC

## 2015-12-23 MED ORDER — PREDNISONE 20 MG PO TABS: 20.0000 mg | ORAL_TABLET | Freq: Every day | ORAL | 0 refills | Status: DC

## 2015-12-23 MED ORDER — AMLODIPINE BESYLATE 5 MG PO TABS: 5.0000 mg | ORAL_TABLET | Freq: Every day | ORAL | 1 refills | Status: DC

## 2015-12-23 MED ORDER — OMEPRAZOLE 20 MG PO CPDR: 20.0000 mg | DELAYED_RELEASE_CAPSULE | Freq: Every day | ORAL | 1 refills | Status: DC

## 2015-12-23 MED ORDER — METOPROLOL TARTRATE 50 MG PO TABS: 50.0000 mg | ORAL_TABLET | Freq: Two times a day (BID) | ORAL | 1 refills | Status: DC

## 2015-12-23 MED ORDER — HYDROXYZINE HCL 25 MG PO TABS: 25.0000 mg | ORAL_TABLET | Freq: Three times a day (TID) | ORAL | 1 refills | Status: DC | PRN

## 2015-12-23 MED ORDER — INSULIN GLARGINE 100 UNIT/ML ~~LOC~~ SOLN: 45.0000 [IU] | Freq: Every day | SUBCUTANEOUS | 3 refills | Status: DC

## 2015-12-23 MED ORDER — FLUTICASONE PROPIONATE 50 MCG/ACT NA SUSP: 2.0000 | Freq: Every day | NASAL | 3 refills | Status: DC

## 2015-12-23 MED ORDER — SIMVASTATIN 10 MG PO TABS: 10.0000 mg | ORAL_TABLET | Freq: Every day | ORAL | 1 refills | Status: DC

## 2015-12-23 MED ORDER — GABAPENTIN 300 MG PO CAPS: 300.0000 mg | ORAL_CAPSULE | Freq: Every day | ORAL | 1 refills | Status: DC

## 2015-12-23 MED ORDER — KLOR-CON M20 20 MEQ PO TBCR: 20.0000 meq | EXTENDED_RELEASE_TABLET | Freq: Every day | ORAL | 1 refills | Status: DC

## 2015-12-23 MED ORDER — BENAZEPRIL HCL 40 MG PO TABS: 40.0000 mg | ORAL_TABLET | Freq: Every day | ORAL | 1 refills | Status: DC

## 2015-12-23 MED ORDER — FLUCONAZOLE 150 MG PO TABS: 150.0000 mg | ORAL_TABLET | Freq: Once | ORAL | 0 refills | Status: DC
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
## 2015-12-23 NOTE — Progress Notes (Signed)
Thinks she was exposed to an STD Using 37 units of the lantus at bedtime- bl sugars continue to be above 22

## 2015-12-23 NOTE — Patient Instructions (Signed)

## 2015-12-23 NOTE — Progress Notes (Signed)
Patient here for lab visit only 

## 2015-12-24 LAB — MICROALBUMIN / CREATININE URINE RATIO
Creatinine, Urine: 198 mg/dL (ref 20–320)
Microalb Creat Ratio: 27 mcg/mg creat (ref <30)
Microalb, Ur: 5.4 mg/dL

## 2015-12-24 LAB — HEMOGLOBIN A1C
HEMOGLOBIN A1C: 9.9 % — AB (ref <5.7)
Mean Plasma Glucose: 237 mg/dL

## 2015-12-24 LAB — URINE CYTOLOGY ANCILLARY ONLY
Chlamydia: NEGATIVE
Neisseria Gonorrhea: NEGATIVE
Trichomonas: NEGATIVE

## 2015-12-24 LAB — HIV ANTIBODY (ROUTINE TESTING W REFLEX): HIV: NONREACTIVE

## 2015-12-29 ENCOUNTER — Other Ambulatory Visit: Payer: Self-pay | Admitting: Family Medicine

## 2015-12-29 DIAGNOSIS — N898 Other specified noninflammatory disorders of vagina: Secondary | ICD-10-CM

## 2015-12-30 LAB — URINE CYTOLOGY ANCILLARY ONLY
BACTERIAL VAGINITIS: NEGATIVE
Candida vaginitis: NEGATIVE

## 2016-02-03 ENCOUNTER — Encounter (HOSPITAL_COMMUNITY): Payer: Self-pay | Admitting: Emergency Medicine

## 2016-02-03 ENCOUNTER — Ambulatory Visit (HOSPITAL_COMMUNITY)
Admission: EM | Admit: 2016-02-03 | Discharge: 2016-02-03 | Disposition: A | Discharge status: Home / Self Care | Payer: BLUE CROSS/BLUE SHIELD | Attending: Family Medicine | Admitting: Family Medicine

## 2016-02-03 DIAGNOSIS — Z79899 Other long term (current) drug therapy: Secondary | ICD-10-CM | POA: Diagnosis not present

## 2016-02-03 DIAGNOSIS — Z882 Allergy status to sulfonamides status: Secondary | ICD-10-CM | POA: Diagnosis not present

## 2016-02-03 DIAGNOSIS — N898 Other specified noninflammatory disorders of vagina: Secondary | ICD-10-CM | POA: Insufficient documentation

## 2016-02-03 DIAGNOSIS — N3001 Acute cystitis with hematuria: Secondary | ICD-10-CM | POA: Diagnosis not present

## 2016-02-03 DIAGNOSIS — Z87891 Personal history of nicotine dependence: Secondary | ICD-10-CM | POA: Insufficient documentation

## 2016-02-03 DIAGNOSIS — Z794 Long term (current) use of insulin: Secondary | ICD-10-CM | POA: Insufficient documentation

## 2016-02-03 MED ORDER — METRONIDAZOLE 500 MG PO TABS: 500.0000 mg | ORAL_TABLET | Freq: Two times a day (BID) | ORAL | 0 refills | Status: DC

## 2016-02-03 MED ORDER — AZITHROMYCIN 250 MG PO TABS
1000.0000 mg | ORAL_TABLET | Freq: Once | ORAL | Status: AC
  Administered 2016-02-03: 1000 mg via ORAL

## 2016-02-03 MED ORDER — CIPROFLOXACIN HCL 500 MG PO TABS: 500.0000 mg | ORAL_TABLET | Freq: Two times a day (BID) | ORAL | 0 refills | Status: DC

## 2016-02-03 MED ORDER — AZITHROMYCIN 250 MG PO TABS
ORAL_TABLET | ORAL | Status: AC
  Filled 2016-02-03: qty 4
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
## 2016-02-03 NOTE — Discharge Instructions (Signed)
We are running test to better characterize your infection. Please start the antibiotics as prescribed. We should have the results of these tests back by Friday.

## 2016-02-03 NOTE — ED Triage Notes (Signed)
[facility] a [facility] of bilateral lower back pain and a vaginal discharge x 2 weeks.

## 2016-02-04 LAB — CERVICOVAGINAL ANCILLARY ONLY: Wet Prep (BD Affirm): POSITIVE — AB

## 2016-02-05 LAB — CERVICOVAGINAL ANCILLARY ONLY
Chlamydia: NEGATIVE
Neisseria Gonorrhea: NEGATIVE

## 2016-02-08 ENCOUNTER — Telehealth: Payer: Self-pay | Admitting: Emergency Medicine

## 2016-02-08 NOTE — Telephone Encounter (Signed)
-----   Message from Elvina SidleKurt Lauenstein, MD sent at 02/08/2016 11:56 AM EST ----- Please inform patient of normal result

## 2016-02-10 NOTE — Telephone Encounter (Signed)
Pt called from [phone] Notified of recent lab results from visit 02/03/16 Pt ID'd properly... Reports feeling better and sx have subsided Pt is taking/tolarating well meds given at visit.  Adv pt if sx are not getting better to return or to f/u w/PCP Education on safe sex given Also adv pt to notify partner(s) Pt verb understanding.

## 2016-03-15 ENCOUNTER — Ambulatory Visit (HOSPITAL_COMMUNITY)
Admission: RE | Admit: 2016-03-15 | Discharge: 2016-03-15 | Disposition: A | Discharge status: Home / Self Care | Payer: BLUE CROSS/BLUE SHIELD | Source: Ambulatory Visit | Attending: Family Medicine | Admitting: Family Medicine

## 2016-03-15 ENCOUNTER — Encounter: Payer: Self-pay | Admitting: Family Medicine

## 2016-03-15 ENCOUNTER — Ambulatory Visit: Payer: BLUE CROSS/BLUE SHIELD | Attending: Family Medicine | Admitting: Family Medicine

## 2016-03-15 VITALS — BP 125/80 | HR 75 | Temp 98.1°F | Ht 64.5 in | Wt 183.2 lb

## 2016-03-15 DIAGNOSIS — G4700 Insomnia, unspecified: Secondary | ICD-10-CM | POA: Insufficient documentation

## 2016-03-15 DIAGNOSIS — G8929 Other chronic pain: Secondary | ICD-10-CM | POA: Diagnosis not present

## 2016-03-15 DIAGNOSIS — M25511 Pain in right shoulder: Secondary | ICD-10-CM | POA: Insufficient documentation

## 2016-03-15 DIAGNOSIS — I10 Essential (primary) hypertension: Secondary | ICD-10-CM | POA: Insufficient documentation

## 2016-03-15 DIAGNOSIS — E118 Type 2 diabetes mellitus with unspecified complications: Secondary | ICD-10-CM

## 2016-03-15 DIAGNOSIS — M25561 Pain in right knee: Secondary | ICD-10-CM | POA: Diagnosis not present

## 2016-03-15 DIAGNOSIS — J45909 Unspecified asthma, uncomplicated: Secondary | ICD-10-CM | POA: Insufficient documentation

## 2016-03-15 DIAGNOSIS — K3184 Gastroparesis: Secondary | ICD-10-CM | POA: Diagnosis not present

## 2016-03-15 DIAGNOSIS — E785 Hyperlipidemia, unspecified: Secondary | ICD-10-CM | POA: Insufficient documentation

## 2016-03-15 DIAGNOSIS — E1143 Type 2 diabetes mellitus with diabetic autonomic (poly)neuropathy: Secondary | ICD-10-CM | POA: Diagnosis present

## 2016-03-15 DIAGNOSIS — Z794 Long term (current) use of insulin: Secondary | ICD-10-CM | POA: Insufficient documentation

## 2016-03-15 DIAGNOSIS — G4709 Other insomnia: Secondary | ICD-10-CM

## 2016-03-15 DIAGNOSIS — K279 Peptic ulcer, site unspecified, unspecified as acute or chronic, without hemorrhage or perforation: Secondary | ICD-10-CM | POA: Diagnosis not present

## 2016-03-15 DIAGNOSIS — Z79899 Other long term (current) drug therapy: Secondary | ICD-10-CM | POA: Diagnosis not present

## 2016-03-15 LAB — GLUCOSE, POCT (MANUAL RESULT ENTRY): POC Glucose: 361 mg/dl — AB (ref 70–99)

## 2016-03-15 MED ORDER — TRAMADOL HCL 50 MG PO TABS: 50.0000 mg | ORAL_TABLET | Freq: Three times a day (TID) | ORAL | 0 refills | Status: DC | PRN

## 2016-03-15 MED ORDER — TRAZODONE HCL 50 MG PO TABS: 50.0000 mg | ORAL_TABLET | Freq: Every day | ORAL | 3 refills | Status: DC

## 2016-03-15 MED ORDER — INSULIN GLARGINE 100 UNIT/ML ~~LOC~~ SOLN
52.0000 [IU] | Freq: Every day | SUBCUTANEOUS | 3 refills | Status: DC
Start: 2016-03-15 — End: 2016-04-26
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
## 2016-03-15 NOTE — Patient Instructions (Signed)
Shoulder Pain Many things can cause shoulder pain, including:  An injury to the area.  Overuse of the shoulder.  Arthritis. The source of the pain can be:  Inflammation.  An injury to the shoulder joint.  An injury to a tendon, ligament, or bone. Follow these instructions at home: Take these actions to help with your pain:  Squeeze a soft ball or a foam pad as much as possible. This helps to keep the shoulder from swelling. It also helps to strengthen the arm.  Take over-the-counter and prescription medicines only as told by your health care provider.  If directed, apply ice to the area:  Put ice in a plastic bag.  Place a towel between your skin and the bag.  Leave the ice on for 20 minutes, 2-3 times per day. Stop applying ice if it does not help with the pain.  If you were given a shoulder sling or immobilizer:  Wear it as told.  Remove it to shower or bathe.  Move your arm as little as possible, but keep your hand moving to prevent swelling. Contact a health care provider if:  Your pain gets worse.  Your pain is not relieved with medicines.  New pain develops in your arm, hand, or fingers. Get help right away if:  Your arm, hand, or fingers:  Tingle.  Become numb.  Become swollen.  Become painful.  Turn white or blue. This information is not intended to replace advice given to you by your health care provider. Make sure you discuss any questions you have with your health care provider. Document Released: 11/24/2004 Document Revised: 10/11/2015 Document Reviewed: 06/09/2014 Elsevier Interactive Patient Education  2017 [employer]  

## 2016-03-29 ENCOUNTER — Encounter: Payer: BLUE CROSS/BLUE SHIELD | Admitting: *Deleted

## 2016-04-12 ENCOUNTER — Ambulatory Visit (HOSPITAL_COMMUNITY)
Admission: EM | Admit: 2016-04-12 | Discharge: 2016-04-12 | Disposition: A | Discharge status: Home / Self Care | Payer: BLUE CROSS/BLUE SHIELD | Attending: Family Medicine | Admitting: Family Medicine

## 2016-04-12 ENCOUNTER — Encounter (HOSPITAL_COMMUNITY): Payer: Self-pay | Admitting: Emergency Medicine

## 2016-04-12 DIAGNOSIS — R69 Illness, unspecified: Secondary | ICD-10-CM

## 2016-04-12 DIAGNOSIS — J111 Influenza due to unidentified influenza virus with other respiratory manifestations: Secondary | ICD-10-CM

## 2016-04-12 MED ORDER — IPRATROPIUM BROMIDE 0.06 % NA SOLN: 2.0000 | Freq: Four times a day (QID) | NASAL | 1 refills | Status: DC

## 2016-04-12 MED ORDER — ONDANSETRON HCL 4 MG PO TABS: 4.0000 mg | ORAL_TABLET | Freq: Four times a day (QID) | ORAL | 0 refills | Status: DC

## 2016-04-12 MED ORDER — ONDANSETRON 4 MG PO TBDP
ORAL_TABLET | ORAL | Status: AC
  Filled 2016-04-12: qty 1

## 2016-04-12 MED ORDER — ONDANSETRON 4 MG PO TBDP
4.0000 mg | ORAL_TABLET | Freq: Once | ORAL | Status: AC
  Administered 2016-04-12: 4 mg via ORAL
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
## 2016-04-12 NOTE — ED Triage Notes (Signed)
[facility] a [facility] of a sore throat and lower back pain that started yesterday.

## 2016-04-13 ENCOUNTER — Other Ambulatory Visit: Payer: Self-pay | Admitting: Pharmacist

## 2016-04-13 DIAGNOSIS — I10 Essential (primary) hypertension: Secondary | ICD-10-CM

## 2016-04-13 MED ORDER — METOPROLOL TARTRATE 50 MG PO TABS: 50.0000 mg | ORAL_TABLET | Freq: Two times a day (BID) | ORAL | 0 refills | Status: DC

## 2016-04-15 ENCOUNTER — Other Ambulatory Visit: Payer: Self-pay | Admitting: Family Medicine

## 2016-04-15 DIAGNOSIS — E7800 Pure hypercholesterolemia, unspecified: Secondary | ICD-10-CM

## 2016-04-18 ENCOUNTER — Other Ambulatory Visit: Payer: Self-pay | Admitting: Family Medicine

## 2016-04-18 DIAGNOSIS — I10 Essential (primary) hypertension: Secondary | ICD-10-CM

## 2016-04-26 ENCOUNTER — Encounter: Payer: Self-pay | Admitting: Family Medicine

## 2016-04-26 ENCOUNTER — Ambulatory Visit: Payer: BLUE CROSS/BLUE SHIELD | Attending: Family Medicine | Admitting: Family Medicine

## 2016-04-26 VITALS — BP 135/77 | HR 71 | Temp 98.4°F | Ht 65.0 in | Wt 179.8 lb

## 2016-04-26 DIAGNOSIS — E785 Hyperlipidemia, unspecified: Secondary | ICD-10-CM | POA: Insufficient documentation

## 2016-04-26 DIAGNOSIS — R05 Cough: Secondary | ICD-10-CM | POA: Diagnosis not present

## 2016-04-26 DIAGNOSIS — Z9119 Patient's noncompliance with other medical treatment and regimen: Secondary | ICD-10-CM | POA: Diagnosis not present

## 2016-04-26 DIAGNOSIS — J45909 Unspecified asthma, uncomplicated: Secondary | ICD-10-CM | POA: Diagnosis not present

## 2016-04-26 DIAGNOSIS — Z882 Allergy status to sulfonamides status: Secondary | ICD-10-CM | POA: Insufficient documentation

## 2016-04-26 DIAGNOSIS — Z794 Long term (current) use of insulin: Secondary | ICD-10-CM | POA: Insufficient documentation

## 2016-04-26 DIAGNOSIS — I10 Essential (primary) hypertension: Secondary | ICD-10-CM | POA: Diagnosis not present

## 2016-04-26 DIAGNOSIS — E118 Type 2 diabetes mellitus with unspecified complications: Secondary | ICD-10-CM

## 2016-04-26 DIAGNOSIS — Z79899 Other long term (current) drug therapy: Secondary | ICD-10-CM | POA: Diagnosis not present

## 2016-04-26 DIAGNOSIS — M546 Pain in thoracic spine: Secondary | ICD-10-CM | POA: Insufficient documentation

## 2016-04-26 DIAGNOSIS — G4709 Other insomnia: Secondary | ICD-10-CM | POA: Insufficient documentation

## 2016-04-26 DIAGNOSIS — K3184 Gastroparesis: Secondary | ICD-10-CM | POA: Insufficient documentation

## 2016-04-26 DIAGNOSIS — E1143 Type 2 diabetes mellitus with diabetic autonomic (poly)neuropathy: Secondary | ICD-10-CM | POA: Insufficient documentation

## 2016-04-26 LAB — COMPLETE METABOLIC PANEL WITH GFR
ALT: 14 U/L (ref 6–29)
AST: 16 U/L (ref 10–35)
Albumin: 4.2 g/dL (ref 3.6–5.1)
Alkaline Phosphatase: 82 U/L (ref 33–130)
BUN: 13 mg/dL (ref 7–25)
CALCIUM: 9.6 mg/dL (ref 8.6–10.4)
CHLORIDE: 95 mmol/L — AB (ref 98–110)
CO2: 33 mmol/L — AB (ref 20–31)
Creat: 1 mg/dL (ref 0.50–1.05)
GFR, EST AFRICAN AMERICAN: 74 mL/min (ref >=60)
GFR, Est Non African American: 64 mL/min (ref >=60)
Glucose, Bld: 324 mg/dL — ABNORMAL HIGH (ref 65–99)
POTASSIUM: 4.1 mmol/L (ref 3.5–5.3)
Sodium: 138 mmol/L (ref 135–146)
Total Bilirubin: 0.4 mg/dL (ref 0.2–1.2)
Total Protein: 7.4 g/dL (ref 6.1–8.1)

## 2016-04-26 LAB — POCT URINALYSIS DIPSTICK
Bilirubin, UA: NEGATIVE
GLUCOSE UA: 500
Ketones, UA: NEGATIVE
LEUKOCYTES UA: NEGATIVE
NITRITE UA: NEGATIVE
PROTEIN UA: NEGATIVE
Spec Grav, UA: 1.015
UROBILINOGEN UA: 0.2
pH, UA: 6

## 2016-04-26 LAB — POCT GLYCOSYLATED HEMOGLOBIN (HGB A1C): HEMOGLOBIN A1C: 11.8

## 2016-04-26 LAB — GLUCOSE, POCT (MANUAL RESULT ENTRY): POC GLUCOSE: 317 mg/dL — AB (ref 70–99)

## 2016-04-26 MED ORDER — METOPROLOL TARTRATE 100 MG PO TABS: 100.0000 mg | ORAL_TABLET | Freq: Two times a day (BID) | ORAL | 1 refills | Status: DC

## 2016-04-26 MED ORDER — INSULIN GLARGINE 100 UNIT/ML ~~LOC~~ SOLN: 65.0000 [IU] | Freq: Every day | SUBCUTANEOUS | 3 refills | Status: DC

## 2016-04-26 MED ORDER — CYCLOBENZAPRINE HCL 10 MG PO TABS: 10.0000 mg | ORAL_TABLET | Freq: Two times a day (BID) | ORAL | 1 refills | Status: DC | PRN

## 2016-04-26 MED ORDER — TRAZODONE HCL 100 MG PO TABS: 50.0000 mg | ORAL_TABLET | Freq: Every day | ORAL | 1 refills | Status: DC
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
## 2016-04-26 NOTE — Progress Notes (Signed)
Would like to discuss the amlodipine

## 2016-05-24 ENCOUNTER — Ambulatory Visit: Payer: BLUE CROSS/BLUE SHIELD | Attending: Family Medicine | Admitting: Family Medicine

## 2016-05-24 ENCOUNTER — Encounter: Payer: Self-pay | Admitting: Family Medicine

## 2016-05-24 DIAGNOSIS — K3184 Gastroparesis: Secondary | ICD-10-CM | POA: Diagnosis not present

## 2016-05-24 DIAGNOSIS — E118 Type 2 diabetes mellitus with unspecified complications: Secondary | ICD-10-CM

## 2016-05-24 DIAGNOSIS — E7800 Pure hypercholesterolemia, unspecified: Secondary | ICD-10-CM | POA: Diagnosis not present

## 2016-05-24 DIAGNOSIS — E1149 Type 2 diabetes mellitus with other diabetic neurological complication: Secondary | ICD-10-CM

## 2016-05-24 DIAGNOSIS — F411 Generalized anxiety disorder: Secondary | ICD-10-CM | POA: Diagnosis not present

## 2016-05-24 DIAGNOSIS — E1143 Type 2 diabetes mellitus with diabetic autonomic (poly)neuropathy: Secondary | ICD-10-CM | POA: Insufficient documentation

## 2016-05-24 DIAGNOSIS — E785 Hyperlipidemia, unspecified: Secondary | ICD-10-CM | POA: Insufficient documentation

## 2016-05-24 DIAGNOSIS — K253 Acute gastric ulcer without hemorrhage or perforation: Secondary | ICD-10-CM | POA: Insufficient documentation

## 2016-05-24 DIAGNOSIS — J45909 Unspecified asthma, uncomplicated: Secondary | ICD-10-CM | POA: Diagnosis not present

## 2016-05-24 DIAGNOSIS — Z79899 Other long term (current) drug therapy: Secondary | ICD-10-CM | POA: Diagnosis not present

## 2016-05-24 DIAGNOSIS — Z794 Long term (current) use of insulin: Secondary | ICD-10-CM | POA: Insufficient documentation

## 2016-05-24 DIAGNOSIS — G4700 Insomnia, unspecified: Secondary | ICD-10-CM | POA: Insufficient documentation

## 2016-05-24 DIAGNOSIS — I10 Essential (primary) hypertension: Secondary | ICD-10-CM | POA: Diagnosis not present

## 2016-05-24 DIAGNOSIS — Z882 Allergy status to sulfonamides status: Secondary | ICD-10-CM | POA: Insufficient documentation

## 2016-05-24 LAB — GLUCOSE, POCT (MANUAL RESULT ENTRY): POC GLUCOSE: 110 mg/dL — AB (ref 70–99)

## 2016-05-24 MED ORDER — GABAPENTIN 300 MG PO CAPS: 600.0000 mg | ORAL_CAPSULE | Freq: Every day | ORAL | 1 refills | Status: DC

## 2016-05-24 MED ORDER — HYDROXYZINE HCL 25 MG PO TABS: 25.0000 mg | ORAL_TABLET | Freq: Three times a day (TID) | ORAL | 1 refills | Status: DC | PRN

## 2016-05-24 MED ORDER — HYDROCHLOROTHIAZIDE 25 MG PO TABS: 25.0000 mg | ORAL_TABLET | Freq: Every day | ORAL | 1 refills | Status: DC

## 2016-05-24 MED ORDER — OMEPRAZOLE 20 MG PO CPDR: 20.0000 mg | DELAYED_RELEASE_CAPSULE | Freq: Every day | ORAL | 1 refills | Status: DC

## 2016-05-24 MED ORDER — BENAZEPRIL HCL 40 MG PO TABS: 40.0000 mg | ORAL_TABLET | Freq: Every day | ORAL | 1 refills | Status: DC

## 2016-05-24 MED ORDER — INSULIN GLARGINE 100 UNIT/ML ~~LOC~~ SOLN: 75.0000 [IU] | Freq: Every day | SUBCUTANEOUS | 3 refills | Status: DC

## 2016-05-24 MED ORDER — INSULIN ASPART 100 UNIT/ML ~~LOC~~ SOLN: SUBCUTANEOUS | 1 refills | Status: DC

## 2016-05-24 MED ORDER — SIMVASTATIN 10 MG PO TABS: 10.0000 mg | ORAL_TABLET | Freq: Every day | ORAL | 1 refills | Status: DC

## 2016-05-24 MED ORDER — LORATADINE 10 MG PO TABS: 10.0000 mg | ORAL_TABLET | Freq: Every day | ORAL | 3 refills | Status: DC

## 2016-05-24 MED ORDER — KLOR-CON M20 20 MEQ PO TBCR: 20.0000 meq | EXTENDED_RELEASE_TABLET | Freq: Every day | ORAL | 1 refills | Status: DC

## 2016-05-24 MED ORDER — GLIMEPIRIDE 4 MG PO TABS: 4.0000 mg | ORAL_TABLET | Freq: Two times a day (BID) | ORAL | 1 refills | Status: DC

## 2016-05-24 MED ORDER — GLUCOSE BLOOD VI STRP: ORAL_STRIP | 5 refills | Status: AC

## 2016-05-24 NOTE — Patient Instructions (Signed)
Diabetes Mellitus and Exercise Exercising regularly is important for your overall health, especially when you have diabetes (diabetes mellitus). Exercising is not only about losing weight. It has many health benefits, such as increasing muscle strength and bone density and reducing body fat and stress. This leads to improved fitness, flexibility, and endurance, all of which result in better overall health. Exercise has additional benefits for people with diabetes, including:  Reducing appetite.  Helping to lower and control blood glucose.  Lowering blood pressure.  Helping to control amounts of fatty substances (lipids) in the blood, such as cholesterol and triglycerides.  Helping the body to respond better to insulin (improving insulin sensitivity).  Reducing how much insulin the body needs.  Decreasing the risk for heart disease by:  Lowering cholesterol and triglyceride levels.  Increasing the levels of good cholesterol.  Lowering blood glucose levels. What is my activity plan? Your health care provider or certified diabetes educator can help you make a plan for the type and frequency of exercise (activity plan) that works for you. Make sure that you:  Do at least 150 minutes of moderate-intensity or vigorous-intensity exercise each week. This could be brisk walking, biking, or water aerobics.  Do stretching and strength exercises, such as yoga or weightlifting, at least 2 times a week.  Spread out your activity over at least 3 days of the week.  Get some form of physical activity every day.  Do not go more than 2 days in a row without some kind of physical activity.  Avoid being inactive for more than 90 minutes at a time. Take frequent breaks to walk or stretch.  Choose a type of exercise or activity that you enjoy, and set realistic goals.  Start slowly, and gradually increase the intensity of your exercise over time. What do I need to know about managing my  diabetes?  Check your blood glucose before and after exercising.  If your blood glucose is higher than 240 mg/dL (13.3 mmol/L) before you exercise, check your urine for ketones. If you have ketones in your urine, do not exercise until your blood glucose returns to normal.  Know the symptoms of low blood glucose (hypoglycemia) and how to treat it. Your risk for hypoglycemia increases during and after exercise. Common symptoms of hypoglycemia can include:  Hunger.  Anxiety.  Sweating and feeling clammy.  Confusion.  Dizziness or feeling light-headed.  Increased heart rate or palpitations.  Blurry vision.  Tingling or numbness around the mouth, lips, or tongue.  Tremors or shakes.  Irritability.  Keep a rapid-acting carbohydrate snack available before, during, and after exercise to help prevent or treat hypoglycemia.  Avoid injecting insulin into areas of the body that are going to be exercised. For example, avoid injecting insulin into:  The arms, when playing tennis.  The legs, when jogging.  Keep records of your exercise habits. Doing this can help you and your health care provider adjust your diabetes management plan as needed. Write down:  Food that you eat before and after you exercise.  Blood glucose levels before and after you exercise.  The type and amount of exercise you have done.  When your insulin is expected to peak, if you use insulin. Avoid exercising at times when your insulin is peaking.  When you start a new exercise or activity, work with your health care provider to make sure the activity is safe for you, and to adjust your insulin, medicines, or food intake as needed.  Drink plenty   of water while you exercise to prevent dehydration or heat stroke. Drink enough fluid to keep your urine clear or pale yellow. This information is not intended to replace advice given to you by your health care provider. Make sure you discuss any questions you have with  your health care provider. Document Released: 05/07/2003 Document Revised: 09/04/2015 Document Reviewed: 07/27/2015 Elsevier Interactive Patient Education  2017 [employer]  

## 2016-05-24 NOTE — Progress Notes (Signed)
Patient is here for DM  Patient denies pain at this time.  Patient has taken medication today. Patient has eaten today.   
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
## 2016-05-26 ENCOUNTER — Other Ambulatory Visit: Payer: Self-pay | Admitting: Pharmacist

## 2016-05-26 MED ORDER — INSULIN LISPRO 100 UNIT/ML ~~LOC~~ SOLN: 0.0000 [IU] | Freq: Three times a day (TID) | SUBCUTANEOUS | 11 refills | Status: DC

## 2016-07-28 ENCOUNTER — Ambulatory Visit: Payer: BLUE CROSS/BLUE SHIELD | Attending: Family Medicine | Admitting: Family Medicine

## 2016-07-28 VITALS — BP 146/89 | HR 72 | Temp 98.0°F | Resp 18 | Ht 65.0 in | Wt 188.0 lb

## 2016-07-28 DIAGNOSIS — K219 Gastro-esophageal reflux disease without esophagitis: Secondary | ICD-10-CM | POA: Diagnosis not present

## 2016-07-28 DIAGNOSIS — M25561 Pain in right knee: Secondary | ICD-10-CM | POA: Diagnosis not present

## 2016-07-28 DIAGNOSIS — F411 Generalized anxiety disorder: Secondary | ICD-10-CM | POA: Diagnosis not present

## 2016-07-28 DIAGNOSIS — E1149 Type 2 diabetes mellitus with other diabetic neurological complication: Secondary | ICD-10-CM | POA: Diagnosis not present

## 2016-07-28 DIAGNOSIS — M546 Pain in thoracic spine: Secondary | ICD-10-CM | POA: Diagnosis not present

## 2016-07-28 DIAGNOSIS — E1143 Type 2 diabetes mellitus with diabetic autonomic (poly)neuropathy: Secondary | ICD-10-CM | POA: Diagnosis present

## 2016-07-28 DIAGNOSIS — E118 Type 2 diabetes mellitus with unspecified complications: Secondary | ICD-10-CM

## 2016-07-28 DIAGNOSIS — K253 Acute gastric ulcer without hemorrhage or perforation: Secondary | ICD-10-CM | POA: Insufficient documentation

## 2016-07-28 DIAGNOSIS — Z882 Allergy status to sulfonamides status: Secondary | ICD-10-CM | POA: Insufficient documentation

## 2016-07-28 DIAGNOSIS — Z794 Long term (current) use of insulin: Secondary | ICD-10-CM | POA: Insufficient documentation

## 2016-07-28 DIAGNOSIS — E7800 Pure hypercholesterolemia, unspecified: Secondary | ICD-10-CM | POA: Diagnosis not present

## 2016-07-28 DIAGNOSIS — I10 Essential (primary) hypertension: Secondary | ICD-10-CM | POA: Diagnosis not present

## 2016-07-28 DIAGNOSIS — Z79899 Other long term (current) drug therapy: Secondary | ICD-10-CM | POA: Insufficient documentation

## 2016-07-28 DIAGNOSIS — J45909 Unspecified asthma, uncomplicated: Secondary | ICD-10-CM | POA: Diagnosis not present

## 2016-07-28 DIAGNOSIS — G8929 Other chronic pain: Secondary | ICD-10-CM | POA: Diagnosis not present

## 2016-07-28 DIAGNOSIS — K3184 Gastroparesis: Secondary | ICD-10-CM | POA: Diagnosis not present

## 2016-07-28 DIAGNOSIS — G4709 Other insomnia: Secondary | ICD-10-CM

## 2016-07-28 DIAGNOSIS — Z1159 Encounter for screening for other viral diseases: Secondary | ICD-10-CM

## 2016-07-28 DIAGNOSIS — G4700 Insomnia, unspecified: Secondary | ICD-10-CM | POA: Insufficient documentation

## 2016-07-28 LAB — POCT GLYCOSYLATED HEMOGLOBIN (HGB A1C): HEMOGLOBIN A1C: 8.4

## 2016-07-28 LAB — GLUCOSE, POCT (MANUAL RESULT ENTRY): POC Glucose: 151 mg/dl — AB (ref 70–99)

## 2016-07-28 MED ORDER — TRAZODONE HCL 100 MG PO TABS: 50.0000 mg | ORAL_TABLET | Freq: Every day | ORAL | 1 refills | Status: DC

## 2016-07-28 MED ORDER — TRAMADOL HCL 50 MG PO TABS: 50.0000 mg | ORAL_TABLET | Freq: Two times a day (BID) | ORAL | 1 refills | Status: DC | PRN

## 2016-07-28 MED ORDER — HYDROCHLOROTHIAZIDE 25 MG PO TABS: 25.0000 mg | ORAL_TABLET | Freq: Every day | ORAL | 1 refills | Status: DC

## 2016-07-28 MED ORDER — CYCLOBENZAPRINE HCL 10 MG PO TABS: 10.0000 mg | ORAL_TABLET | Freq: Two times a day (BID) | ORAL | 1 refills | Status: DC | PRN

## 2016-07-28 MED ORDER — METOPROLOL TARTRATE 100 MG PO TABS: 100.0000 mg | ORAL_TABLET | Freq: Two times a day (BID) | ORAL | 1 refills | Status: DC

## 2016-07-28 MED ORDER — FLUTICASONE PROPIONATE 50 MCG/ACT NA SUSP: 2.0000 | Freq: Every day | NASAL | 3 refills | Status: DC

## 2016-07-28 MED ORDER — OMEPRAZOLE 20 MG PO CPDR: 20.0000 mg | DELAYED_RELEASE_CAPSULE | Freq: Every day | ORAL | 1 refills | Status: DC

## 2016-07-28 MED ORDER — GABAPENTIN 300 MG PO CAPS: 600.0000 mg | ORAL_CAPSULE | Freq: Every day | ORAL | 1 refills | Status: DC

## 2016-07-28 MED ORDER — GLIMEPIRIDE 4 MG PO TABS: 4.0000 mg | ORAL_TABLET | Freq: Two times a day (BID) | ORAL | 1 refills | Status: DC

## 2016-07-28 MED ORDER — INSULIN GLARGINE 100 UNIT/ML ~~LOC~~ SOLN: 75.0000 [IU] | Freq: Every day | SUBCUTANEOUS | 3 refills | Status: DC

## 2016-07-28 MED ORDER — METOCLOPRAMIDE HCL 5 MG PO TABS: 5.0000 mg | ORAL_TABLET | Freq: Three times a day (TID) | ORAL | 1 refills | Status: DC

## 2016-07-28 MED ORDER — BENAZEPRIL HCL 40 MG PO TABS: 40.0000 mg | ORAL_TABLET | Freq: Every day | ORAL | 1 refills | Status: DC

## 2016-07-28 MED ORDER — LORATADINE 10 MG PO TABS: 10.0000 mg | ORAL_TABLET | Freq: Every day | ORAL | 3 refills | Status: DC

## 2016-07-28 MED ORDER — SIMVASTATIN 10 MG PO TABS: 10.0000 mg | ORAL_TABLET | Freq: Every day | ORAL | 1 refills | Status: DC

## 2016-07-28 MED ORDER — HYDROXYZINE HCL 25 MG PO TABS: 25.0000 mg | ORAL_TABLET | Freq: Three times a day (TID) | ORAL | 1 refills | Status: DC | PRN

## 2016-07-28 MED ORDER — KLOR-CON M20 20 MEQ PO TBCR: 20.0000 meq | EXTENDED_RELEASE_TABLET | Freq: Every day | ORAL | 1 refills | Status: DC
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
## 2016-07-28 NOTE — Progress Notes (Signed)
Patient is here for DM FU  Patient took medication just before appointment.  Patient has only drank green tea this morning.  Patient denies pain at this time.

## 2016-07-28 NOTE — Patient Instructions (Signed)
Gastroparesis °Gastroparesis, also called delayed gastric emptying, is a condition in which food takes longer than normal to empty from the stomach. The condition is usually long-lasting (chronic). °What are the causes? °This condition may be caused by: °· An endocrine disorder, such as hypothyroidism or diabetes. Diabetes is the most common cause of this condition. °· A nervous system disease, such as Parkinson disease or multiple sclerosis. °· Cancer, infection, or surgery of the stomach or vagus nerve. °· A connective tissue disorder, such as scleroderma. °· Certain medicines. ° °In most cases, the cause is not known. °What increases the risk? °This condition is more likely to develop in: °· People with certain disorders, including endocrine disorders, eating disorders, amyloidosis, and scleroderma. °· People with certain diseases, including Parkinson disease or multiple sclerosis. °· People with cancer or infection of the stomach or vagus nerve. °· People who have had surgery on the stomach or vagus nerve. °· People who take certain medicines. °· Women. ° °What are the signs or symptoms? °Symptoms of this condition include: °· An early feeling of fullness when eating. °· Nausea. °· Weight loss. °· Vomiting. °· Heartburn. °· Abdominal bloating. °· Inconsistent blood glucose levels. °· Lack of appetite. °· Acid from the stomach coming up into the esophagus (gastroesophageal reflux). °· Spasms of the stomach. ° °Symptoms may come and go. °How is this diagnosed? °This condition is diagnosed with tests, such as: °· Tests that check how long it takes food to move through the stomach and intestines. These tests include: °? Upper gastrointestinal (GI) series. In this test, X-rays of the intestines are taken after you drink a liquid. The liquid makes the intestines show up better on the X-rays. °? Gastric emptying scintigraphy. In this test, scans are taken after you eat food that contains a small amount of radioactive  material. °? Wireless capsule GI monitoring system. This test involves swallowing a capsule that records information about movement through the stomach. °· Gastric manometry. This test measures electrical and muscular activity in the stomach. It is done with a thin tube that is passed down the throat and into the stomach. °· Endoscopy. This test checks for abnormalities in the lining of the stomach. It is done with a long, thin tube that is passed down the throat and into the stomach. °· An ultrasound. This test can help rule out gallbladder disease or pancreatitis as a cause of your symptoms. It uses sound waves to take pictures of the inside of your body. ° °How is this treated? °There is no cure for gastroparesis. This condition may be managed with: °· Treatment of the underlying condition causing the gastroparesis. °· Lifestyle changes, including exercise and dietary changes. Dietary changes can include: °? Changes in what and when you eat. °? Eating smaller meals more often. °? Eating low-fat foods. °? Eating low-fiber forms of high-fiber foods, such as cooked vegetables instead of raw vegetables. °? Having liquid foods in place of solid foods. Liquid foods are easier to digest. °· Medicines. These may be given to control nausea and vomiting and to stimulate stomach muscles. °· Getting food through a feeding tube. This may be done in severe cases. °· A gastric neurostimulator. This is a device that is inserted into the body with surgery. It helps improve stomach emptying and control nausea and vomiting. ° °Follow these instructions at home: °· Follow your health care provider's instructions about exercise and diet. °· Take medicines only as directed by your health care provider. °Contact a   health care provider if: °· Your symptoms do not improve with treatment. °· You have new symptoms. °Get help right away if: °· You have severe abdominal pain that does not improve with treatment. °· You have nausea that does  not go away. °· You cannot keep fluids down. °This information is not intended to replace advice given to you by your health care provider. Make sure you discuss any questions you have with your health care provider. °Document Released: 02/14/2005 Document Revised: 07/23/2015 Document Reviewed: 02/10/2014 °Elsevier Interactive Patient Education © 2018 [employer] ° °

## 2016-07-29 ENCOUNTER — Encounter: Payer: Self-pay | Admitting: Family Medicine

## 2016-08-03 ENCOUNTER — Ambulatory Visit: Payer: BLUE CROSS/BLUE SHIELD | Attending: Family Medicine

## 2016-08-03 DIAGNOSIS — E118 Type 2 diabetes mellitus with unspecified complications: Secondary | ICD-10-CM

## 2016-08-03 DIAGNOSIS — E7800 Pure hypercholesterolemia, unspecified: Secondary | ICD-10-CM | POA: Insufficient documentation

## 2016-08-03 DIAGNOSIS — Z794 Long term (current) use of insulin: Secondary | ICD-10-CM | POA: Insufficient documentation

## 2016-08-03 DIAGNOSIS — Z1159 Encounter for screening for other viral diseases: Secondary | ICD-10-CM | POA: Insufficient documentation

## 2016-08-03 NOTE — Progress Notes (Signed)
Patient here for lab visit only 

## 2016-08-04 ENCOUNTER — Ambulatory Visit (INDEPENDENT_AMBULATORY_CARE_PROVIDER_SITE_OTHER): Payer: Self-pay | Admitting: Orthopaedic Surgery

## 2016-08-04 ENCOUNTER — Telehealth: Payer: Self-pay | Admitting: Family Medicine

## 2016-08-04 LAB — CMP14+EGFR
ALBUMIN: 4.6 g/dL (ref 3.5–5.5)
ALT: 18 IU/L (ref 0–32)
AST: 20 IU/L (ref 0–40)
Albumin/Globulin Ratio: 2 (ref 1.2–2.2)
Alkaline Phosphatase: 66 IU/L (ref 39–117)
BUN / CREAT RATIO: 19 (ref 9–23)
BUN: 20 mg/dL (ref 6–24)
Bilirubin Total: 0.7 mg/dL (ref 0.0–1.2)
CO2: 27 mmol/L (ref 18–29)
CREATININE: 1.06 mg/dL — AB (ref 0.57–1.00)
Calcium: 9.2 mg/dL (ref 8.7–10.2)
Chloride: 99 mmol/L (ref 96–106)
GFR calc Af Amer: 69 mL/min/{1.73_m2} (ref >59)
GFR, EST NON AFRICAN AMERICAN: 60 mL/min/{1.73_m2} (ref >59)
GLOBULIN, TOTAL: 2.3 g/dL (ref 1.5–4.5)
Glucose: 99 mg/dL (ref 65–99)
Potassium: 3.6 mmol/L (ref 3.5–5.2)
SODIUM: 143 mmol/L (ref 134–144)
TOTAL PROTEIN: 6.9 g/dL (ref 6.0–8.5)

## 2016-08-04 LAB — HEPATITIS C ANTIBODY (REFLEX): HCV Ab: <0.1 s/co ratio (ref 0.0–0.9)

## 2016-08-04 LAB — LIPID PANEL
CHOL/HDL RATIO: 4.1 ratio (ref 0.0–4.4)
Cholesterol, Total: 174 mg/dL (ref 100–199)
HDL: 42 mg/dL (ref >39)
LDL CALC: 88 mg/dL (ref 0–99)
Triglycerides: 219 mg/dL — ABNORMAL HIGH (ref 0–149)
VLDL CHOLESTEROL CAL: 44 mg/dL — AB (ref 5–40)

## 2016-08-04 LAB — HCV COMMENT:

## 2016-08-04 NOTE — Telephone Encounter (Signed)
Pt. Called requesting to speak with her nurse to see if she received FMLA paperwork. Pt. States that paperwork was faxed over by her employer on 07/28/16. Please f/u with pt.

## 2016-08-11 ENCOUNTER — Other Ambulatory Visit: Payer: Self-pay | Admitting: Family Medicine

## 2016-08-19 NOTE — Telephone Encounter (Signed)
Pt. Called stating that nurse had called her and left a VM stating that her FMLA paperwork had been faxed. Pt. States her employer did not receive the paperwork and would like to know if it can be re faxed. Please f/u with pt.

## 2016-08-22 NOTE — Telephone Encounter (Signed)
Are either one of you ladies familiar with this telephone note?

## 2016-08-22 NOTE — Telephone Encounter (Signed)
Left message on voicemail to return call.

## 2016-08-22 NOTE — Telephone Encounter (Signed)
I looked in my fax folder, did not see documents for this patient.

## 2016-08-22 NOTE — Telephone Encounter (Signed)
I did not fax any paperwork for this patient.

## 2016-08-23 NOTE — Telephone Encounter (Addendum)
Pt states she needs forms faxed to [facility] for FMLA:  DM, Gastroparesis, HTN  Pt was given fax number to our facility to have forms faxed again.

## 2016-08-25 NOTE — Telephone Encounter (Signed)
Pt informed that paperwork was received on 08/24/2016. Will call back when faxed and she may pick up at convenience when completed.

## 2016-08-26 NOTE — Telephone Encounter (Signed)
Pt aware to pick up paperwork.

## 2016-09-06 ENCOUNTER — Encounter (INDEPENDENT_AMBULATORY_CARE_PROVIDER_SITE_OTHER): Payer: Self-pay | Admitting: Orthopaedic Surgery

## 2016-09-06 ENCOUNTER — Ambulatory Visit (INDEPENDENT_AMBULATORY_CARE_PROVIDER_SITE_OTHER): Payer: BLUE CROSS/BLUE SHIELD

## 2016-09-06 ENCOUNTER — Ambulatory Visit (INDEPENDENT_AMBULATORY_CARE_PROVIDER_SITE_OTHER): Payer: BLUE CROSS/BLUE SHIELD | Admitting: Orthopaedic Surgery

## 2016-09-06 DIAGNOSIS — M2241 Chondromalacia patellae, right knee: Secondary | ICD-10-CM
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
## 2016-09-09 ENCOUNTER — Other Ambulatory Visit (HOSPITAL_COMMUNITY)
Admission: RE | Admit: 2016-09-09 | Discharge: 2016-09-09 | Disposition: A | Discharge status: Home / Self Care | Payer: BLUE CROSS/BLUE SHIELD | Source: Ambulatory Visit | Attending: Family Medicine | Admitting: Family Medicine

## 2016-09-09 ENCOUNTER — Ambulatory Visit: Payer: BLUE CROSS/BLUE SHIELD | Attending: Family Medicine | Admitting: Physician Assistant

## 2016-09-09 VITALS — BP 159/90 | HR 72 | Temp 98.6°F | Resp 18 | Ht 64.0 in | Wt 190.0 lb

## 2016-09-09 DIAGNOSIS — I10 Essential (primary) hypertension: Secondary | ICD-10-CM | POA: Insufficient documentation

## 2016-09-09 DIAGNOSIS — N930 Postcoital and contact bleeding: Secondary | ICD-10-CM | POA: Diagnosis not present

## 2016-09-09 DIAGNOSIS — Z794 Long term (current) use of insulin: Secondary | ICD-10-CM | POA: Insufficient documentation

## 2016-09-09 DIAGNOSIS — Z7951 Long term (current) use of inhaled steroids: Secondary | ICD-10-CM | POA: Insufficient documentation

## 2016-09-09 DIAGNOSIS — E0800 Diabetes mellitus due to underlying condition with hyperosmolarity without nonketotic hyperglycemic-hyperosmolar coma (NKHHC): Secondary | ICD-10-CM | POA: Diagnosis not present

## 2016-09-09 DIAGNOSIS — Z888 Allergy status to other drugs, medicaments and biological substances status: Secondary | ICD-10-CM | POA: Diagnosis not present

## 2016-09-09 DIAGNOSIS — Z882 Allergy status to sulfonamides status: Secondary | ICD-10-CM | POA: Insufficient documentation

## 2016-09-09 DIAGNOSIS — J45909 Unspecified asthma, uncomplicated: Secondary | ICD-10-CM | POA: Insufficient documentation

## 2016-09-09 DIAGNOSIS — E1143 Type 2 diabetes mellitus with diabetic autonomic (poly)neuropathy: Secondary | ICD-10-CM | POA: Insufficient documentation

## 2016-09-09 DIAGNOSIS — E1100 Type 2 diabetes mellitus with hyperosmolarity without nonketotic hyperglycemic-hyperosmolar coma (NKHHC): Secondary | ICD-10-CM | POA: Insufficient documentation

## 2016-09-09 DIAGNOSIS — K219 Gastro-esophageal reflux disease without esophagitis: Secondary | ICD-10-CM | POA: Insufficient documentation

## 2016-09-09 DIAGNOSIS — N898 Other specified noninflammatory disorders of vagina: Secondary | ICD-10-CM | POA: Diagnosis not present

## 2016-09-09 DIAGNOSIS — K3184 Gastroparesis: Secondary | ICD-10-CM | POA: Diagnosis not present

## 2016-09-09 LAB — GLUCOSE, POCT (MANUAL RESULT ENTRY): POC GLUCOSE: 190 mg/dL — AB (ref 70–99)
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
## 2016-09-09 NOTE — Progress Notes (Signed)
Patient is here   Patient denies pain at this time.  Patient has taken medication today. Patient has not eaten this morning but ate late last night after work.

## 2016-09-09 NOTE — Patient Instructions (Signed)
astroglide with purple lid  Replens vaginal moisturizer  Probiotic daily

## 2016-09-12 ENCOUNTER — Other Ambulatory Visit: Payer: Self-pay | Admitting: Physician Assistant

## 2016-09-12 LAB — CERVICOVAGINAL ANCILLARY ONLY
Bacterial vaginitis: POSITIVE — AB
CHLAMYDIA, DNA PROBE: NEGATIVE
Candida vaginitis: NEGATIVE
NEISSERIA GONORRHEA: NEGATIVE
TRICH (WINDOWPATH): NEGATIVE

## 2016-09-12 MED ORDER — METRONIDAZOLE 500 MG PO TABS: 500.0000 mg | ORAL_TABLET | Freq: Two times a day (BID) | ORAL | 0 refills | Status: DC

## 2016-09-13 ENCOUNTER — Telehealth: Payer: Self-pay

## 2016-09-13 MED ORDER — METRONIDAZOLE 500 MG PO TABS: 500.0000 mg | ORAL_TABLET | Freq: Two times a day (BID) | ORAL | 0 refills | Status: DC

## 2016-09-13 NOTE — Telephone Encounter (Signed)
Pt called to get lab results. Pt was informed of lab results and I changed the medication to the correct pharmacy.

## 2016-09-21 ENCOUNTER — Other Ambulatory Visit: Payer: Self-pay | Admitting: *Deleted

## 2016-09-21 ENCOUNTER — Telehealth: Payer: Self-pay | Admitting: Family Medicine

## 2016-09-21 MED ORDER — MEDROXYPROGESTERONE ACETATE 10 MG PO TABS: 10.0000 mg | ORAL_TABLET | Freq: Every day | ORAL | 0 refills | Status: DC

## 2016-09-21 NOTE — Telephone Encounter (Signed)
Pt call to speak with the triage nurse, need to talk about monospot and her cycle issue please call her back since she has few question

## 2016-09-21 NOTE — Telephone Encounter (Signed)
[facility] [address]  [facility] [address]  Reports medication is on hold and will not fill.

## 2016-09-21 NOTE — Telephone Encounter (Addendum)
Pt states she has been on her menestration for about 2 weeks. She has irregular bleeding in her cycle, "seems like the 1st day of her cycle then I have spotting".  She wanted to know if she could take anything to stop this. She is leaving to go to the beach this week. Pt wanted to know if there was anything that she could take for bleeding.   Denies dizziness, chest pain or SOB.  She denies need for frequent change to sanitary.   Informed that lab test could determine phase of menopause. Could discuss with physician about options of possible hormonal therapy at office visit.   Please advise

## 2016-09-21 NOTE — Telephone Encounter (Signed)
Provera sent to pharmacy

## 2016-09-21 NOTE — Telephone Encounter (Signed)
Pt aware, [facility], on [address]

## 2016-09-26 ENCOUNTER — Other Ambulatory Visit: Payer: Self-pay | Admitting: Family Medicine

## 2016-09-27 NOTE — Telephone Encounter (Signed)
Pt. Called stating that her PCP had prescribed her medroxyPROGESTERone (PROVERA) 10 MG tablet  For her menstrual cycle. Pt. States she has 3 pills left and she is still on her cycle. Please f/u with pt.

## 2016-09-27 NOTE — Telephone Encounter (Signed)
I had given her a prescription for Provera without an office visit because she had an irregular bleed and at that time had said she would be going to the beach. She would need to complete her course of Provera and will need to discuss further management of abnormal uterine bleed at an office visit.

## 2016-09-27 NOTE — Telephone Encounter (Signed)
Patient is requesting a refill on Provera

## 2016-10-04 ENCOUNTER — Telehealth: Payer: Self-pay | Admitting: *Deleted

## 2016-10-04 ENCOUNTER — Ambulatory Visit: Payer: BLUE CROSS/BLUE SHIELD | Attending: Family Medicine | Admitting: Family Medicine

## 2016-10-04 VITALS — BP 139/89 | HR 78 | Temp 98.4°F | Resp 16 | Wt 192.4 lb

## 2016-10-04 DIAGNOSIS — E1100 Type 2 diabetes mellitus with hyperosmolarity without nonketotic hyperglycemic-hyperosmolar coma (NKHHC): Secondary | ICD-10-CM | POA: Insufficient documentation

## 2016-10-04 DIAGNOSIS — J45909 Unspecified asthma, uncomplicated: Secondary | ICD-10-CM | POA: Diagnosis not present

## 2016-10-04 DIAGNOSIS — E1143 Type 2 diabetes mellitus with diabetic autonomic (poly)neuropathy: Secondary | ICD-10-CM | POA: Insufficient documentation

## 2016-10-04 DIAGNOSIS — N939 Abnormal uterine and vaginal bleeding, unspecified: Secondary | ICD-10-CM

## 2016-10-04 DIAGNOSIS — Z794 Long term (current) use of insulin: Secondary | ICD-10-CM | POA: Diagnosis not present

## 2016-10-04 DIAGNOSIS — K219 Gastro-esophageal reflux disease without esophagitis: Secondary | ICD-10-CM | POA: Diagnosis not present

## 2016-10-04 DIAGNOSIS — Z9889 Other specified postprocedural states: Secondary | ICD-10-CM | POA: Diagnosis not present

## 2016-10-04 DIAGNOSIS — Z882 Allergy status to sulfonamides status: Secondary | ICD-10-CM | POA: Diagnosis not present

## 2016-10-04 DIAGNOSIS — Z8711 Personal history of peptic ulcer disease: Secondary | ICD-10-CM | POA: Insufficient documentation

## 2016-10-04 DIAGNOSIS — E0800 Diabetes mellitus due to underlying condition with hyperosmolarity without nonketotic hyperglycemic-hyperosmolar coma (NKHHC): Secondary | ICD-10-CM | POA: Diagnosis not present

## 2016-10-04 DIAGNOSIS — I10 Essential (primary) hypertension: Secondary | ICD-10-CM | POA: Insufficient documentation

## 2016-10-04 DIAGNOSIS — E785 Hyperlipidemia, unspecified: Secondary | ICD-10-CM | POA: Insufficient documentation

## 2016-10-04 DIAGNOSIS — Z886 Allergy status to analgesic agent status: Secondary | ICD-10-CM | POA: Diagnosis not present

## 2016-10-04 DIAGNOSIS — Z888 Allergy status to other drugs, medicaments and biological substances status: Secondary | ICD-10-CM | POA: Insufficient documentation

## 2016-10-04 LAB — GLUCOSE, POCT (MANUAL RESULT ENTRY): POC Glucose: 209 mg/dl — AB (ref 70–99)

## 2016-10-04 MED ORDER — "INSULIN SYRINGE-NEEDLE U-100 31G X 5/16"" 1 ML MISC": 1.0000 | Freq: Four times a day (QID) | 12 refills | Status: AC

## 2016-10-04 MED ORDER — MEDROXYPROGESTERONE ACETATE 10 MG PO TABS: 10.0000 mg | ORAL_TABLET | Freq: Every day | ORAL | 0 refills | Status: DC

## 2016-10-04 NOTE — Telephone Encounter (Addendum)
Spoke to patient, Pt aware of appointment. Aware to arrive 15 mins. Early and have full bladder.  Pt verified understanding. Addressed questions and concerns.

## 2016-10-04 NOTE — Patient Instructions (Signed)
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
## 2016-10-05 ENCOUNTER — Encounter: Payer: Self-pay | Admitting: Family Medicine

## 2016-10-05 LAB — CBC WITH DIFFERENTIAL/PLATELET
BASOS: 0 %
Basophils Absolute: 0 10*3/uL (ref 0.0–0.2)
EOS (ABSOLUTE): 0.1 10*3/uL (ref 0.0–0.4)
Eos: 2 %
HEMATOCRIT: 38.4 % (ref 34.0–46.6)
HEMOGLOBIN: 12.6 g/dL (ref 11.1–15.9)
IMMATURE GRANS (ABS): 0 10*3/uL (ref 0.0–0.1)
Immature Granulocytes: 0 %
LYMPHS: 54 %
Lymphocytes Absolute: 2.8 10*3/uL (ref 0.7–3.1)
MCH: 30.4 pg (ref 26.6–33.0)
MCHC: 32.8 g/dL (ref 31.5–35.7)
MCV: 93 fL (ref 79–97)
MONOCYTES: 7 %
Monocytes Absolute: 0.3 10*3/uL (ref 0.1–0.9)
NEUTROS ABS: 2 10*3/uL (ref 1.4–7.0)
Neutrophils: 37 %
Platelets: 223 10*3/uL (ref 150–379)
RBC: 4.15 x10E6/uL (ref 3.77–5.28)
RDW: 13.7 % (ref 12.3–15.4)
WBC: 5.3 10*3/uL (ref 3.4–10.8)

## 2016-10-07 ENCOUNTER — Telehealth: Payer: Self-pay | Admitting: Family Medicine

## 2016-10-07 NOTE — Telephone Encounter (Signed)
No paperwork is ready for this pt at this time. Will contact pt once paperwork is ready.

## 2016-10-07 NOTE — Telephone Encounter (Signed)
Patient dropped off form to be completed by Dr. Venetia NightAmao Was placed in Dr Jen Mowamao's basket.  Patient called today checking on status  Please follow up with patient.

## 2016-10-13 ENCOUNTER — Ambulatory Visit (HOSPITAL_COMMUNITY)
Admission: RE | Admit: 2016-10-13 | Discharge: 2016-10-13 | Disposition: A | Discharge status: Home / Self Care | Payer: BLUE CROSS/BLUE SHIELD | Source: Ambulatory Visit | Attending: Family Medicine | Admitting: Family Medicine

## 2016-10-13 DIAGNOSIS — R938 Abnormal findings on diagnostic imaging of other specified body structures: Secondary | ICD-10-CM | POA: Insufficient documentation

## 2016-10-13 DIAGNOSIS — N939 Abnormal uterine and vaginal bleeding, unspecified: Secondary | ICD-10-CM | POA: Diagnosis not present

## 2016-10-14 ENCOUNTER — Other Ambulatory Visit: Payer: Self-pay | Admitting: Family Medicine

## 2016-10-14 ENCOUNTER — Telehealth: Payer: Self-pay | Admitting: Family Medicine

## 2016-10-14 DIAGNOSIS — N939 Abnormal uterine and vaginal bleeding, unspecified: Secondary | ICD-10-CM

## 2016-10-14 NOTE — Telephone Encounter (Signed)
Pt called back to review results, no vm was left but states she received a call. Please f/up

## 2016-10-17 NOTE — Telephone Encounter (Signed)
Pt was called and explained lab results. 

## 2016-10-17 NOTE — Telephone Encounter (Signed)
Pt. Called stating that she was saw her results on mychart b/c she does not understand them. Please f/u with pt.

## 2016-10-18 ENCOUNTER — Ambulatory Visit (INDEPENDENT_AMBULATORY_CARE_PROVIDER_SITE_OTHER): Payer: BLUE CROSS/BLUE SHIELD | Admitting: Obstetrics and Gynecology

## 2016-10-18 ENCOUNTER — Encounter: Payer: Self-pay | Admitting: Obstetrics and Gynecology

## 2016-10-18 ENCOUNTER — Other Ambulatory Visit (HOSPITAL_COMMUNITY)
Admission: RE | Admit: 2016-10-18 | Discharge: 2016-10-18 | Disposition: A | Discharge status: Home / Self Care | Payer: BLUE CROSS/BLUE SHIELD | Source: Ambulatory Visit | Attending: Obstetrics and Gynecology | Admitting: Obstetrics and Gynecology

## 2016-10-18 VITALS — BP 130/80 | HR 84 | Ht 64.0 in | Wt 190.0 lb

## 2016-10-18 DIAGNOSIS — N939 Abnormal uterine and vaginal bleeding, unspecified: Secondary | ICD-10-CM | POA: Diagnosis not present

## 2016-10-18 DIAGNOSIS — Z01419 Encounter for gynecological examination (general) (routine) without abnormal findings: Secondary | ICD-10-CM | POA: Diagnosis not present

## 2016-10-18 DIAGNOSIS — E049 Nontoxic goiter, unspecified: Secondary | ICD-10-CM

## 2016-10-18 DIAGNOSIS — N930 Postcoital and contact bleeding: Secondary | ICD-10-CM

## 2016-10-18 DIAGNOSIS — Z01812 Encounter for preprocedural laboratory examination: Secondary | ICD-10-CM

## 2016-10-18 DIAGNOSIS — Z124 Encounter for screening for malignant neoplasm of cervix: Secondary | ICD-10-CM | POA: Diagnosis not present

## 2016-10-18 DIAGNOSIS — N941 Unspecified dyspareunia: Secondary | ICD-10-CM | POA: Diagnosis not present

## 2016-10-18 LAB — POCT URINE PREGNANCY: Preg Test, Ur: NEGATIVE

## 2016-10-18 MED ORDER — NORETHINDRONE ACETATE 5 MG PO TABS: 5.0000 mg | ORAL_TABLET | Freq: Every day | ORAL | 0 refills | Status: DC
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
## 2016-10-18 NOTE — Patient Instructions (Signed)

## 2016-10-19 DIAGNOSIS — N858 Other specified noninflammatory disorders of uterus: Secondary | ICD-10-CM | POA: Diagnosis not present

## 2016-10-19 LAB — CBC
Hematocrit: 31.5 % — ABNORMAL LOW (ref 34.0–46.6)
Hemoglobin: 10.4 g/dL — ABNORMAL LOW (ref 11.1–15.9)
MCH: 30.8 pg (ref 26.6–33.0)
MCHC: 33 g/dL (ref 31.5–35.7)
MCV: 93 fL (ref 79–97)
PLATELETS: 341 10*3/uL (ref 150–379)
RBC: 3.38 x10E6/uL — AB (ref 3.77–5.28)
RDW: 14.2 % (ref 12.3–15.4)
WBC: 7.3 10*3/uL (ref 3.4–10.8)

## 2016-10-19 LAB — THYROID PANEL WITH TSH
Free Thyroxine Index: 1.5 (ref 1.2–4.9)
T3 Uptake Ratio: 24 % (ref 24–39)
T4 TOTAL: 6.4 ug/dL (ref 4.5–12.0)
TSH: 0.733 u[IU]/mL (ref 0.450–4.500)

## 2016-10-19 LAB — FERRITIN: Ferritin: 50 ng/mL (ref 15–150)

## 2016-10-19 NOTE — Addendum Note (Signed)
Addended by: Tobi Bastos on: 10/19/2016 11:27 AM   Modules accepted: Orders

## 2016-10-21 LAB — CYTOLOGY - PAP
Diagnosis: NEGATIVE
HPV: NOT DETECTED

## 2016-10-28 ENCOUNTER — Ambulatory Visit: Payer: BLUE CROSS/BLUE SHIELD | Attending: Family Medicine | Admitting: Family Medicine

## 2016-10-28 ENCOUNTER — Encounter: Payer: Self-pay | Admitting: Family Medicine

## 2016-10-28 VITALS — BP 150/85 | HR 73 | Temp 98.5°F | Ht 64.0 in | Wt 194.8 lb

## 2016-10-28 DIAGNOSIS — Z79891 Long term (current) use of opiate analgesic: Secondary | ICD-10-CM | POA: Insufficient documentation

## 2016-10-28 DIAGNOSIS — K219 Gastro-esophageal reflux disease without esophagitis: Secondary | ICD-10-CM | POA: Insufficient documentation

## 2016-10-28 DIAGNOSIS — Z794 Long term (current) use of insulin: Secondary | ICD-10-CM | POA: Insufficient documentation

## 2016-10-28 DIAGNOSIS — E119 Type 2 diabetes mellitus without complications: Secondary | ICD-10-CM | POA: Insufficient documentation

## 2016-10-28 DIAGNOSIS — E7800 Pure hypercholesterolemia, unspecified: Secondary | ICD-10-CM | POA: Insufficient documentation

## 2016-10-28 DIAGNOSIS — I10 Essential (primary) hypertension: Secondary | ICD-10-CM | POA: Insufficient documentation

## 2016-10-28 DIAGNOSIS — G4700 Insomnia, unspecified: Secondary | ICD-10-CM | POA: Diagnosis not present

## 2016-10-28 DIAGNOSIS — Z79899 Other long term (current) drug therapy: Secondary | ICD-10-CM | POA: Insufficient documentation

## 2016-10-28 DIAGNOSIS — E870 Hyperosmolality and hypernatremia: Secondary | ICD-10-CM | POA: Insufficient documentation

## 2016-10-28 DIAGNOSIS — E0800 Diabetes mellitus due to underlying condition with hyperosmolarity without nonketotic hyperglycemic-hyperosmolar coma (NKHHC): Secondary | ICD-10-CM

## 2016-10-28 DIAGNOSIS — N939 Abnormal uterine and vaginal bleeding, unspecified: Secondary | ICD-10-CM | POA: Insufficient documentation

## 2016-10-28 DIAGNOSIS — G4709 Other insomnia: Secondary | ICD-10-CM | POA: Diagnosis not present

## 2016-10-28 LAB — POCT GLYCOSYLATED HEMOGLOBIN (HGB A1C): HEMOGLOBIN A1C: 7.4

## 2016-10-28 LAB — GLUCOSE, POCT (MANUAL RESULT ENTRY): POC Glucose: 219 mg/dl — AB (ref 70–99)

## 2016-10-28 MED ORDER — CARVEDILOL 12.5 MG PO TABS: 12.5000 mg | ORAL_TABLET | Freq: Two times a day (BID) | ORAL | 1 refills | Status: DC
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
## 2016-11-01 ENCOUNTER — Other Ambulatory Visit: Payer: BLUE CROSS/BLUE SHIELD | Admitting: Obstetrics and Gynecology

## 2016-11-01 ENCOUNTER — Encounter: Payer: Self-pay | Admitting: Obstetrics and Gynecology

## 2016-11-01 ENCOUNTER — Ambulatory Visit (INDEPENDENT_AMBULATORY_CARE_PROVIDER_SITE_OTHER): Payer: BLUE CROSS/BLUE SHIELD

## 2016-11-01 ENCOUNTER — Other Ambulatory Visit: Payer: BLUE CROSS/BLUE SHIELD

## 2016-11-01 ENCOUNTER — Other Ambulatory Visit: Payer: Self-pay | Admitting: Obstetrics and Gynecology

## 2016-11-01 ENCOUNTER — Ambulatory Visit (INDEPENDENT_AMBULATORY_CARE_PROVIDER_SITE_OTHER): Payer: BLUE CROSS/BLUE SHIELD | Admitting: Obstetrics and Gynecology

## 2016-11-01 VITALS — BP 132/78 | HR 80 | Resp 16 | Wt 192.0 lb

## 2016-11-01 DIAGNOSIS — N939 Abnormal uterine and vaginal bleeding, unspecified: Secondary | ICD-10-CM | POA: Diagnosis not present

## 2016-11-01 DIAGNOSIS — Z3009 Encounter for other general counseling and advice on contraception: Secondary | ICD-10-CM | POA: Diagnosis not present
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
## 2016-11-02 ENCOUNTER — Ambulatory Visit: Payer: BLUE CROSS/BLUE SHIELD | Admitting: Obstetrics and Gynecology

## 2016-11-02 DIAGNOSIS — Z3009 Encounter for other general counseling and advice on contraception: Secondary | ICD-10-CM

## 2016-11-02 DIAGNOSIS — N939 Abnormal uterine and vaginal bleeding, unspecified: Secondary | ICD-10-CM

## 2016-11-03 ENCOUNTER — Ambulatory Visit: Payer: BLUE CROSS/BLUE SHIELD | Admitting: Obstetrics and Gynecology

## 2016-11-04 ENCOUNTER — Encounter: Payer: Self-pay | Admitting: Obstetrics and Gynecology

## 2016-11-04 ENCOUNTER — Ambulatory Visit (INDEPENDENT_AMBULATORY_CARE_PROVIDER_SITE_OTHER): Payer: BLUE CROSS/BLUE SHIELD | Admitting: Obstetrics and Gynecology

## 2016-11-04 DIAGNOSIS — Z30430 Encounter for insertion of intrauterine contraceptive device: Secondary | ICD-10-CM | POA: Diagnosis not present

## 2016-11-04 DIAGNOSIS — Z3009 Encounter for other general counseling and advice on contraception: Secondary | ICD-10-CM | POA: Diagnosis not present

## 2016-11-04 DIAGNOSIS — N939 Abnormal uterine and vaginal bleeding, unspecified: Secondary | ICD-10-CM

## 2016-11-04 HISTORY — PX: INTRAUTERINE DEVICE (IUD) INSERTION: SHX5877

## 2016-11-04 NOTE — Patient Instructions (Addendum)
IUD Post-procedure Instructions . Cramping is common.  You may take Ibuprofen, Aleve, or Tylenol for the cramping.  This should resolve within 24 hours.   . You may have a small amount of spotting.  You should wear a mini pad for the next few days. . You may have intercourse in 24 hours. . You need to call the office if you have any pelvic pain, fever, heavy bleeding, or foul smelling vaginal discharge. . Shower or bathe as normal . Use a back up form of contraception for one week  
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
## 2016-11-10 ENCOUNTER — Telehealth: Payer: Self-pay | Admitting: Obstetrics and Gynecology

## 2016-11-10 NOTE — Telephone Encounter (Signed)
Spoke with patient. Reports Mirena IUD placed 11/04/16 for AUB. Patient states she is experiencing bleeding with IUD and unsure if normal.   1. Unable to report how often changing pad. States she is  wearing ultra thin pad and is not changing saturated overnight pad q2 hours as before.  2. Reports intermittent lower back cramping throughout the day while at work 3-5/10. Cramping is better when at home resting.  3. Taking Tylenol 1000mg  once daily and Tramadol 50 mg in the evening when needed.   Denies fatigue, lightheadedness, weakness, SHOB, urinary complaints.   Advised patient can take at least 3 months for cycles to adjust with IUD. Continue to monitor bleeding. Return call to office if bleeding becomes heavy -soaking and changing pad q1-2 hours or if severe pain develops. If after hours, [facility] Keep f/u appointment with Dr. Reyne DumasJerston. Advised patient would review with Dr. Oscar LaJertson and return call with any additional recommendations, patient is agreeable.   Patient asking if thyroid US still recommended? Advised patient Thyroid US recommended for further evaluation of enlarged thyroid, [facility]    Dr. Oscar LaJertson -any additional recommendations?

## 2016-11-10 NOTE — Telephone Encounter (Signed)
Reviewed with Dr. Reyne DumasJerston, thyroid US still recommended. Give IUD a little more time, continue to monitor.   Spoke with patient, advised as seen above per Dr. Oscar LaJertson. Patient scheduled US for 11/18/16. Patient verbalizes understanding and is agreeable.   Patient is agreeable to disposition. Will close encounter.

## 2016-11-10 NOTE — Telephone Encounter (Signed)
Patient is bleeding after iud insertion.

## 2016-11-18 ENCOUNTER — Ambulatory Visit
Admission: RE | Admit: 2016-11-18 | Discharge: 2016-11-18 | Disposition: A | Discharge status: Home / Self Care | Payer: BLUE CROSS/BLUE SHIELD | Source: Ambulatory Visit | Attending: Obstetrics and Gynecology | Admitting: Obstetrics and Gynecology

## 2016-11-18 DIAGNOSIS — E049 Nontoxic goiter, unspecified: Secondary | ICD-10-CM

## 2016-11-18 DIAGNOSIS — E041 Nontoxic single thyroid nodule: Secondary | ICD-10-CM | POA: Diagnosis not present

## 2016-12-07 ENCOUNTER — Encounter: Payer: Self-pay | Admitting: Obstetrics and Gynecology

## 2016-12-07 ENCOUNTER — Ambulatory Visit (INDEPENDENT_AMBULATORY_CARE_PROVIDER_SITE_OTHER): Payer: BLUE CROSS/BLUE SHIELD | Admitting: Obstetrics and Gynecology

## 2016-12-07 VITALS — BP 142/80 | HR 72 | Resp 14 | Wt 192.0 lb

## 2016-12-07 DIAGNOSIS — Z30431 Encounter for routine checking of intrauterine contraceptive device: Secondary | ICD-10-CM | POA: Diagnosis not present
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
## 2016-12-26 ENCOUNTER — Other Ambulatory Visit: Payer: Self-pay | Admitting: Pharmacist

## 2016-12-26 DIAGNOSIS — Z794 Long term (current) use of insulin: Principal | ICD-10-CM

## 2016-12-26 DIAGNOSIS — E118 Type 2 diabetes mellitus with unspecified complications: Secondary | ICD-10-CM

## 2016-12-26 MED ORDER — INSULIN GLARGINE 100 UNIT/ML ~~LOC~~ SOLN: 75.0000 [IU] | Freq: Every day | SUBCUTANEOUS | 0 refills | Status: DC

## 2017-01-27 ENCOUNTER — Encounter: Payer: Self-pay | Admitting: Family Medicine

## 2017-01-27 ENCOUNTER — Ambulatory Visit: Payer: BLUE CROSS/BLUE SHIELD | Attending: Family Medicine | Admitting: Family Medicine

## 2017-01-27 VITALS — BP 138/76 | HR 64 | Temp 97.9°F | Wt 195.2 lb

## 2017-01-27 DIAGNOSIS — K219 Gastro-esophageal reflux disease without esophagitis: Secondary | ICD-10-CM | POA: Insufficient documentation

## 2017-01-27 DIAGNOSIS — E0800 Diabetes mellitus due to underlying condition with hyperosmolarity without nonketotic hyperglycemic-hyperosmolar coma (NKHHC): Secondary | ICD-10-CM | POA: Diagnosis not present

## 2017-01-27 DIAGNOSIS — E1169 Type 2 diabetes mellitus with other specified complication: Secondary | ICD-10-CM | POA: Diagnosis not present

## 2017-01-27 DIAGNOSIS — Z79899 Other long term (current) drug therapy: Secondary | ICD-10-CM | POA: Insufficient documentation

## 2017-01-27 DIAGNOSIS — Z794 Long term (current) use of insulin: Secondary | ICD-10-CM | POA: Insufficient documentation

## 2017-01-27 DIAGNOSIS — E1143 Type 2 diabetes mellitus with diabetic autonomic (poly)neuropathy: Secondary | ICD-10-CM | POA: Diagnosis not present

## 2017-01-27 DIAGNOSIS — I10 Essential (primary) hypertension: Secondary | ICD-10-CM | POA: Diagnosis not present

## 2017-01-27 DIAGNOSIS — E785 Hyperlipidemia, unspecified: Secondary | ICD-10-CM | POA: Diagnosis not present

## 2017-01-27 DIAGNOSIS — J4520 Mild intermittent asthma, uncomplicated: Secondary | ICD-10-CM | POA: Diagnosis not present

## 2017-01-27 DIAGNOSIS — F411 Generalized anxiety disorder: Secondary | ICD-10-CM | POA: Diagnosis not present

## 2017-01-27 LAB — GLUCOSE, POCT (MANUAL RESULT ENTRY): POC GLUCOSE: 274 mg/dL — AB (ref 70–99)

## 2017-01-27 NOTE — Patient Instructions (Signed)
Diabetes Mellitus and Food It is important for you to manage your blood sugar (glucose) level. Your blood glucose level can be greatly affected by what you eat. Eating healthier foods in the appropriate amounts throughout the day at about the same time each day will help you control your blood glucose level. It can also help slow or prevent worsening of your diabetes mellitus. Healthy eating may even help you improve the level of your blood pressure and reach or maintain a healthy weight. General recommendations for healthful eating and cooking habits include:  Eating meals and snacks regularly. Avoid going long periods of time without eating to lose weight.  Eating a diet that consists mainly of plant-based foods, such as fruits, vegetables, nuts, legumes, and whole grains.  Using low-heat cooking methods, such as baking, instead of high-heat cooking methods, such as deep frying.  Work with your dietitian to make sure you understand how to use the Nutrition Facts information on food labels. How can food affect me? Carbohydrates Carbohydrates affect your blood glucose level more than any other type of food. Your dietitian will help you determine how many carbohydrates to eat at each meal and teach you how to count carbohydrates. Counting carbohydrates is important to keep your blood glucose at a healthy level, especially if you are using insulin or taking certain medicines for diabetes mellitus. Alcohol Alcohol can cause sudden decreases in blood glucose (hypoglycemia), especially if you use insulin or take certain medicines for diabetes mellitus. Hypoglycemia can be a life-threatening condition. Symptoms of hypoglycemia (sleepiness, dizziness, and disorientation) are similar to symptoms of having too much alcohol. If your health care provider has given you approval to drink alcohol, do so in moderation and use the following guidelines:  Women should not have more than one drink per day, and men  should not have more than two drinks per day. One drink is equal to: ? 12 oz of beer. ? 5 oz of wine. ? 1 oz of hard liquor.  Do not drink on an empty stomach.  Keep yourself hydrated. Have water, diet soda, or unsweetened iced tea.  Regular soda, juice, and other mixers might contain a lot of carbohydrates and should be counted.  What foods are not recommended? As you make food choices, it is important to remember that all foods are not the same. Some foods have fewer nutrients per serving than other foods, even though they might have the same number of calories or carbohydrates. It is difficult to get your body what it needs when you eat foods with fewer nutrients. Examples of foods that you should avoid that are high in calories and carbohydrates but low in nutrients include:  Trans fats (most processed foods list trans fats on the Nutrition Facts label).  Regular soda.  Juice.  Candy.  Sweets, such as cake, pie, doughnuts, and cookies.  Fried foods.  What foods can I eat? Eat nutrient-rich foods, which will nourish your body and keep you healthy. The food you should eat also will depend on several factors, including:  The calories you need.  The medicines you take.  Your weight.  Your blood glucose level.  Your blood pressure level.  Your cholesterol level.  You should eat a variety of foods, including:  Protein. ? Lean cuts of meat. ? Proteins low in saturated fats, such as fish, egg whites, and beans. Avoid processed meats.  Fruits and vegetables. ? Fruits and vegetables that may help control blood glucose levels, such as apples,   mangoes, and yams.  Dairy products. ? Choose fat-free or low-fat dairy products, such as milk, yogurt, and cheese.  Grains, bread, pasta, and rice. ? Choose whole grain products, such as multigrain bread, whole oats, and brown rice. These foods may help control blood pressure.  Fats. ? Foods containing healthful fats, such as  nuts, avocado, olive oil, canola oil, and fish.  Does everyone with diabetes mellitus have the same meal plan? Because every person with diabetes mellitus is different, there is not one meal plan that works for everyone. It is very important that you meet with a dietitian who will help you create a meal plan that is just right for you. This information is not intended to replace advice given to you by your health care provider. Make sure you discuss any questions you have with your health care provider. Document Released: 11/11/2004 Document Revised: 07/23/2015 Document Reviewed: 01/11/2013 Elsevier Interactive Patient Education  2017 Elsevier Inc.  
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
## 2017-01-28 LAB — CMP14+EGFR
ALK PHOS: 71 IU/L (ref 39–117)
ALT: 22 IU/L (ref 0–32)
AST: 28 IU/L (ref 0–40)
Albumin/Globulin Ratio: 1.5 (ref 1.2–2.2)
Albumin: 4.3 g/dL (ref 3.5–5.5)
BUN/Creatinine Ratio: 14 (ref 9–23)
BUN: 15 mg/dL (ref 6–24)
Bilirubin Total: 0.4 mg/dL (ref 0.0–1.2)
CALCIUM: 9.2 mg/dL (ref 8.7–10.2)
CO2: 27 mmol/L (ref 20–29)
CREATININE: 1.11 mg/dL — AB (ref 0.57–1.00)
Chloride: 94 mmol/L — ABNORMAL LOW (ref 96–106)
GFR calc Af Amer: 65 mL/min/{1.73_m2} (ref >59)
GFR, EST NON AFRICAN AMERICAN: 56 mL/min/{1.73_m2} — AB (ref >59)
GLOBULIN, TOTAL: 2.8 g/dL (ref 1.5–4.5)
GLUCOSE: 279 mg/dL — AB (ref 65–99)
Potassium: 4.4 mmol/L (ref 3.5–5.2)
SODIUM: 138 mmol/L (ref 134–144)
Total Protein: 7.1 g/dL (ref 6.0–8.5)

## 2017-01-28 LAB — HEMOGLOBIN A1C
Est. average glucose Bld gHb Est-mCnc: 260 mg/dL
HEMOGLOBIN A1C: 10.7 % — AB (ref 4.8–5.6)

## 2017-01-30 ENCOUNTER — Other Ambulatory Visit: Payer: Self-pay | Admitting: Family Medicine

## 2017-01-30 ENCOUNTER — Telehealth: Payer: Self-pay

## 2017-01-30 DIAGNOSIS — Z794 Long term (current) use of insulin: Principal | ICD-10-CM

## 2017-01-30 DIAGNOSIS — E118 Type 2 diabetes mellitus with unspecified complications: Secondary | ICD-10-CM

## 2017-01-30 MED ORDER — INSULIN GLARGINE 100 UNIT/ML ~~LOC~~ SOLN: 85.0000 [IU] | Freq: Every day | SUBCUTANEOUS | 3 refills | Status: DC

## 2017-01-30 NOTE — Telephone Encounter (Signed)
Pt was called and a VM was left informing pt to increase lantus due to elevated A1c.

## 2017-01-31 ENCOUNTER — Telehealth: Payer: Self-pay | Admitting: Family Medicine

## 2017-01-31 NOTE — Telephone Encounter (Signed)
Pt called back, would like the information provided one more time since she is unable to hear VM. She also has concerns in regards to her insulin Please follow up

## 2017-02-02 ENCOUNTER — Telehealth: Payer: Self-pay

## 2017-02-02 NOTE — Telephone Encounter (Signed)
Pt was called and another VM was left informing pt of lab results.

## 2017-02-07 ENCOUNTER — Emergency Department (HOSPITAL_COMMUNITY): Payer: BLUE CROSS/BLUE SHIELD

## 2017-02-07 ENCOUNTER — Emergency Department (HOSPITAL_COMMUNITY)
Admission: EM | Admit: 2017-02-07 | Discharge: 2017-02-07 | Disposition: A | Discharge status: Home / Self Care | Payer: BLUE CROSS/BLUE SHIELD | Attending: Emergency Medicine | Admitting: Emergency Medicine

## 2017-02-07 ENCOUNTER — Encounter (HOSPITAL_COMMUNITY): Payer: Self-pay | Admitting: Emergency Medicine

## 2017-02-07 DIAGNOSIS — Z87891 Personal history of nicotine dependence: Secondary | ICD-10-CM | POA: Insufficient documentation

## 2017-02-07 DIAGNOSIS — I10 Essential (primary) hypertension: Secondary | ICD-10-CM | POA: Diagnosis not present

## 2017-02-07 DIAGNOSIS — F329 Major depressive disorder, single episode, unspecified: Secondary | ICD-10-CM | POA: Insufficient documentation

## 2017-02-07 DIAGNOSIS — Z794 Long term (current) use of insulin: Secondary | ICD-10-CM | POA: Diagnosis not present

## 2017-02-07 DIAGNOSIS — E119 Type 2 diabetes mellitus without complications: Secondary | ICD-10-CM | POA: Insufficient documentation

## 2017-02-07 DIAGNOSIS — J4521 Mild intermittent asthma with (acute) exacerbation: Secondary | ICD-10-CM | POA: Diagnosis not present

## 2017-02-07 DIAGNOSIS — F419 Anxiety disorder, unspecified: Secondary | ICD-10-CM | POA: Diagnosis not present

## 2017-02-07 DIAGNOSIS — Z79899 Other long term (current) drug therapy: Secondary | ICD-10-CM | POA: Insufficient documentation

## 2017-02-07 DIAGNOSIS — R0602 Shortness of breath: Secondary | ICD-10-CM | POA: Diagnosis not present

## 2017-02-07 DIAGNOSIS — J45909 Unspecified asthma, uncomplicated: Secondary | ICD-10-CM | POA: Diagnosis not present

## 2017-02-07 DIAGNOSIS — R05 Cough: Secondary | ICD-10-CM | POA: Diagnosis not present

## 2017-02-07 LAB — CBC
HEMATOCRIT: 44.8 % (ref 36.0–46.0)
HEMOGLOBIN: 14.8 g/dL (ref 12.0–15.0)
MCH: 29.2 pg (ref 26.0–34.0)
MCHC: 33 g/dL (ref 30.0–36.0)
MCV: 88.5 fL (ref 78.0–100.0)
Platelets: 205 10*3/uL (ref 150–400)
RBC: 5.06 MIL/uL (ref 3.87–5.11)
RDW: 14.2 % (ref 11.5–15.5)
WBC: 4.9 10*3/uL (ref 4.0–10.5)

## 2017-02-07 LAB — BASIC METABOLIC PANEL
ANION GAP: 10 (ref 5–15)
BUN: 7 mg/dL (ref 6–20)
CO2: 29 mmol/L (ref 22–32)
Calcium: 8.5 mg/dL — ABNORMAL LOW (ref 8.9–10.3)
Chloride: 94 mmol/L — ABNORMAL LOW (ref 101–111)
Creatinine, Ser: 0.99 mg/dL (ref 0.44–1.00)
Glucose, Bld: 333 mg/dL — ABNORMAL HIGH (ref 65–99)
POTASSIUM: 3.3 mmol/L — AB (ref 3.5–5.1)
SODIUM: 133 mmol/L — AB (ref 135–145)

## 2017-02-07 LAB — I-STAT TROPONIN, ED: Troponin i, poc: 0.01 ng/mL (ref 0.00–0.08)

## 2017-02-07 LAB — I-STAT BETA HCG BLOOD, ED (MC, WL, AP ONLY): I-stat hCG, quantitative: <5 m[IU]/mL (ref <5)

## 2017-02-07 MED ORDER — IPRATROPIUM-ALBUTEROL 0.5-2.5 (3) MG/3ML IN SOLN
3.0000 mL | Freq: Once | RESPIRATORY_TRACT | Status: AC
  Administered 2017-02-07: 3 mL via RESPIRATORY_TRACT
  Filled 2017-02-07: qty 3

## 2017-02-07 MED ORDER — ALBUTEROL SULFATE (2.5 MG/3ML) 0.083% IN NEBU
5.0000 mg | INHALATION_SOLUTION | Freq: Once | RESPIRATORY_TRACT | Status: AC
  Administered 2017-02-07: 5 mg via RESPIRATORY_TRACT
  Filled 2017-02-07: qty 6

## 2017-02-07 MED ORDER — PREDNISONE 20 MG PO TABS: 40.0000 mg | ORAL_TABLET | Freq: Every day | ORAL | 0 refills | Status: DC

## 2017-02-07 MED ORDER — ALBUTEROL SULFATE (2.5 MG/3ML) 0.083% IN NEBU
5.0000 mg | INHALATION_SOLUTION | Freq: Once | RESPIRATORY_TRACT | Status: AC
  Administered 2017-02-07: 5 mg via RESPIRATORY_TRACT

## 2017-02-07 MED ORDER — PREDNISONE 20 MG PO TABS
40.0000 mg | ORAL_TABLET | Freq: Once | ORAL | Status: AC
  Administered 2017-02-07: 40 mg via ORAL
  Filled 2017-02-07: qty 2

## 2017-02-07 MED ORDER — ALBUTEROL SULFATE (2.5 MG/3ML) 0.083% IN NEBU
INHALATION_SOLUTION | RESPIRATORY_TRACT | Status: AC
  Filled 2017-02-07: qty 6

## 2017-02-07 NOTE — ED Notes (Signed)
Declined W/C at D/C and was escorted to lobby by RN. 

## 2017-02-07 NOTE — ED Triage Notes (Signed)
Pt reports hx of bronchitis, states Thursday she began feeling sob, having cough and wheezing. Reports symptoms have progressed since and she is now able to cough up phlegm that is tan. Pt denies cp. Nad.
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
## 2017-02-07 NOTE — Discharge Instructions (Signed)
It was my pleasure taking care of you today!   Take steroid as directed starting tomorrow. You received your first dose in the ER tonight.  Continue using inhaler as needed.  Keep a close eye on your blood sugar and be sure to follow a healthy diabetic diet.   Follow up with your primary care doctor.   Return to ER for new or worsening symptoms, any additional concerns.

## 2017-02-09 ENCOUNTER — Emergency Department (HOSPITAL_COMMUNITY)
Admission: EM | Admit: 2017-02-09 | Discharge: 2017-02-09 | Disposition: A | Discharge status: Home / Self Care | Payer: BLUE CROSS/BLUE SHIELD | Attending: Emergency Medicine | Admitting: Emergency Medicine

## 2017-02-09 ENCOUNTER — Telehealth: Payer: Self-pay | Admitting: Family Medicine

## 2017-02-09 ENCOUNTER — Encounter (HOSPITAL_COMMUNITY): Payer: Self-pay | Admitting: Emergency Medicine

## 2017-02-09 DIAGNOSIS — R062 Wheezing: Secondary | ICD-10-CM | POA: Diagnosis not present

## 2017-02-09 DIAGNOSIS — Z79899 Other long term (current) drug therapy: Secondary | ICD-10-CM | POA: Diagnosis not present

## 2017-02-09 DIAGNOSIS — I10 Essential (primary) hypertension: Secondary | ICD-10-CM | POA: Diagnosis not present

## 2017-02-09 DIAGNOSIS — Z87891 Personal history of nicotine dependence: Secondary | ICD-10-CM | POA: Insufficient documentation

## 2017-02-09 DIAGNOSIS — J4521 Mild intermittent asthma with (acute) exacerbation: Secondary | ICD-10-CM | POA: Insufficient documentation

## 2017-02-09 DIAGNOSIS — Z794 Long term (current) use of insulin: Secondary | ICD-10-CM | POA: Insufficient documentation

## 2017-02-09 DIAGNOSIS — J45909 Unspecified asthma, uncomplicated: Secondary | ICD-10-CM | POA: Diagnosis not present

## 2017-02-09 DIAGNOSIS — R9431 Abnormal electrocardiogram [ECG] [EKG]: Secondary | ICD-10-CM | POA: Diagnosis not present

## 2017-02-09 LAB — CBC WITH DIFFERENTIAL/PLATELET
BASOS ABS: 0 10*3/uL (ref 0.0–0.1)
Band Neutrophils: 0 %
Basophils Relative: 0 %
Blasts: 0 %
EOS ABS: 0 10*3/uL (ref 0.0–0.7)
EOS PCT: 0 %
HCT: 44.2 % (ref 36.0–46.0)
Hemoglobin: 14.4 g/dL (ref 12.0–15.0)
LYMPHS PCT: 40 %
Lymphs Abs: 2.8 10*3/uL (ref 0.7–4.0)
MCH: 28.8 pg (ref 26.0–34.0)
MCHC: 32.6 g/dL (ref 30.0–36.0)
MCV: 88.4 fL (ref 78.0–100.0)
MONO ABS: 0.4 10*3/uL (ref 0.1–1.0)
MYELOCYTES: 0 %
Metamyelocytes Relative: 0 %
Monocytes Relative: 6 %
NEUTROS PCT: 54 %
NRBC: 0 /100{WBCs}
Neutro Abs: 3.9 10*3/uL (ref 1.7–7.7)
Other: 0 %
PLATELETS: 211 10*3/uL (ref 150–400)
PROMYELOCYTES ABS: 0 %
RBC: 5 MIL/uL (ref 3.87–5.11)
RDW: 14.1 % (ref 11.5–15.5)
WBC: 7.1 10*3/uL (ref 4.0–10.5)

## 2017-02-09 LAB — CBG MONITORING, ED: GLUCOSE-CAPILLARY: 405 mg/dL — AB (ref 65–99)

## 2017-02-09 LAB — BASIC METABOLIC PANEL
ANION GAP: 11 (ref 5–15)
BUN: 17 mg/dL (ref 6–20)
CO2: 29 mmol/L (ref 22–32)
Calcium: 9.5 mg/dL (ref 8.9–10.3)
Chloride: 94 mmol/L — ABNORMAL LOW (ref 101–111)
Creatinine, Ser: 1.09 mg/dL — ABNORMAL HIGH (ref 0.44–1.00)
GFR, EST NON AFRICAN AMERICAN: 56 mL/min — AB (ref >60)
Glucose, Bld: 389 mg/dL — ABNORMAL HIGH (ref 65–99)
POTASSIUM: 3.4 mmol/L — AB (ref 3.5–5.1)
SODIUM: 134 mmol/L — AB (ref 135–145)

## 2017-02-09 MED ORDER — IPRATROPIUM-ALBUTEROL 0.5-2.5 (3) MG/3ML IN SOLN
3.0000 mL | Freq: Once | RESPIRATORY_TRACT | Status: AC
  Administered 2017-02-09: 3 mL via RESPIRATORY_TRACT
  Filled 2017-02-09: qty 3

## 2017-02-09 MED ORDER — MAGNESIUM SULFATE 2 GM/50ML IV SOLN
2.0000 g | INTRAVENOUS | Status: AC
  Administered 2017-02-09: 2 g via INTRAVENOUS
  Filled 2017-02-09: qty 50

## 2017-02-09 MED ORDER — METHYLPREDNISOLONE SODIUM SUCC 125 MG IJ SOLR
125.0000 mg | Freq: Once | INTRAMUSCULAR | Status: AC
Start: 2017-02-09 — End: 2017-02-09
  Administered 2017-02-09: 125 mg via INTRAVENOUS
  Filled 2017-02-09: qty 2

## 2017-02-09 MED ORDER — GUAIFENESIN ER 600 MG PO TB12: 1200.0000 mg | ORAL_TABLET | Freq: Two times a day (BID) | ORAL | 0 refills | Status: AC

## 2017-02-09 MED ORDER — ALBUTEROL SULFATE (2.5 MG/3ML) 0.083% IN NEBU
5.0000 mg | INHALATION_SOLUTION | Freq: Once | RESPIRATORY_TRACT | Status: AC
  Administered 2017-02-09: 5 mg via RESPIRATORY_TRACT
  Filled 2017-02-09: qty 6

## 2017-02-09 MED ORDER — ALBUTEROL (5 MG/ML) CONTINUOUS INHALATION SOLN
10.0000 mg/h | INHALATION_SOLUTION | RESPIRATORY_TRACT | Status: AC
  Administered 2017-02-09: 10 mg/h via RESPIRATORY_TRACT
  Filled 2017-02-09: qty 20

## 2017-02-09 NOTE — ED Notes (Signed)
ED Provider at bedside. 

## 2017-02-09 NOTE — Telephone Encounter (Signed)
Call placed to the patient and informed her that an appointment is available tomorrow, 02/10/17 @ 0945. She was very appreciative and said she would be there. She explained that she went to the ED last night and was instructed to get mucinex.which she stated she has.  She also noted that she understands when she should  return to the ED.
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
## 2017-02-09 NOTE — ED Provider Notes (Signed)
  Physical Exam  BP (!) 141/75   Pulse 69   Temp 98.4 F (36.9 C) (Oral)   Resp 16   Ht 5\' 4"  (1.626 m)   Wt 88.5 kg (195 lb)   SpO2 94%   BMI 33.47 kg/m   Seen 2 days ago for similar. Continued wheezing despite steroid therapy at home. Received 2 Duonebs already in ED course. Will receive continuous neb and be reassessed.   Physical Exam  Constitutional: She appears well-developed and well-nourished. No distress.  Sitting comfortably in bed.  HENT:  Head: Normocephalic and atraumatic.  Eyes: Conjunctivae are normal. Right eye exhibits no discharge. Left eye exhibits no discharge.  EOMs normal to gross examination.  Neck: Normal range of motion.  Cardiovascular: Normal rate and regular rhythm.  Intact, 2+ radial pulse.  Pulmonary/Chest: Effort normal. She has wheezes.  Normal respiratory effort. Patient converses comfortably. No audible wheeze or stridor.  Patient amatory is good air movement with end expiratory wheezes present in lower lung fields.  Abdominal: She exhibits no distension.  Musculoskeletal: Normal range of motion.  Neurological: She is alert.  Cranial nerves intact to gross observation. Patient moves extremities without difficulty.  Skin: Skin is warm and dry. She is not diaphoretic.  Psychiatric: She has a normal mood and affect. Her behavior is normal. Judgment and thought content normal.  Nursing note and vitals reviewed.   ED Course/Procedures     Procedures  MDM   On my reevaluation of the patient after continuous neb therapy, patient was well-appearing and in no acute distress.  Patient is not exhibiting increased work of breathing.  No tachypnea or tachycardia.  I personally ambulated the patient in the department and oxygen saturation remained consistently between 93-96%.  I feel the patient is stable for discharge with close follow-up with her primary care provider.  Patient reports that she was obtaining follow-up with her primary care provider  after today's visit to discuss both her diabetes management as well as asthma management.  No further steroids at this time due to elevated glucose.  I discussed with patient that she should take her oral antihyperglycemics at home.  Given increasing cough as well as patient subjective report of feeling that she has mucus in her chest, will prescribe Mucinex.  Return precautions given for any fever or chills with her symptoms, increasing cough, shortness of breath, or chest pain.  Patient is understanding agrees with the plan of care.  Case discussed with Dr. Crista Curbana Liu.       Elisha PonderMurray, Thelton Graca B, PA-C 02/09/17 1718    Lavera GuiseLiu, Dana Duo, MD 02/10/17 [phone]

## 2017-02-09 NOTE — Telephone Encounter (Signed)
In there ER they gave her a steroid and it is running her sugar high. She is asking for appointment for tomorrow.

## 2017-02-09 NOTE — Discharge Instructions (Signed)
Please read and follow all provided instructions.  Your diagnoses today include:  1. Mild intermittent asthma with acute exacerbation     Tests performed today include: Blood counts, electrolytes. Vital signs. See below for your results today.   Medications prescribed:   Take any prescribed medications only as directed.  Mucinex.  This is to help you get the secretions out of your lungs.  Home care instructions:  Follow any educational materials contained in this packet.  Follow-up instructions: Please follow-up with your primary care provider in the next 3 days for further evaluation of your symptoms and management of your asthma.  Please also follow-up regarding your elevated sugars.  We will not prescribe any more steroids today.  Return instructions:  Please return to the Emergency Department if you experience worsening symptoms. Please return with worsening wheezing, shortness of breath, or difficulty breathing, or chest pain. Return with persistent fever above 101F.  Please return if you have any other emergent concerns.  Additional Information:  Your vital signs today were: BP (!) 159/82    Pulse 73    Temp 98.4 F (36.9 C) (Oral)    Resp 16    Ht 5\' 4"  (1.626 m)    Wt 88.5 kg (195 lb)    SpO2 96%    BMI 33.47 kg/m  If your blood pressure (BP) was elevated above 135/85 this visit, please have this repeated by your doctor within one month. --------------

## 2017-02-09 NOTE — ED Triage Notes (Signed)
Patient reports persistent wheezing with productive cough and chest congestion onset Thursday last week unrelieved by MDI and prescription Prednisone . Denies fever or chills .

## 2017-02-10 ENCOUNTER — Ambulatory Visit: Payer: BLUE CROSS/BLUE SHIELD | Attending: Family Medicine | Admitting: Family Medicine

## 2017-02-10 ENCOUNTER — Encounter: Payer: Self-pay | Admitting: Family Medicine

## 2017-02-10 VITALS — BP 120/75 | HR 67 | Temp 98.2°F | Ht 65.0 in | Wt 198.4 lb

## 2017-02-10 DIAGNOSIS — K219 Gastro-esophageal reflux disease without esophagitis: Secondary | ICD-10-CM | POA: Diagnosis not present

## 2017-02-10 DIAGNOSIS — E1169 Type 2 diabetes mellitus with other specified complication: Secondary | ICD-10-CM | POA: Diagnosis not present

## 2017-02-10 DIAGNOSIS — E1165 Type 2 diabetes mellitus with hyperglycemia: Secondary | ICD-10-CM | POA: Diagnosis not present

## 2017-02-10 DIAGNOSIS — F419 Anxiety disorder, unspecified: Secondary | ICD-10-CM | POA: Diagnosis not present

## 2017-02-10 DIAGNOSIS — I10 Essential (primary) hypertension: Secondary | ICD-10-CM | POA: Insufficient documentation

## 2017-02-10 DIAGNOSIS — Z794 Long term (current) use of insulin: Secondary | ICD-10-CM | POA: Diagnosis not present

## 2017-02-10 DIAGNOSIS — J4541 Moderate persistent asthma with (acute) exacerbation: Secondary | ICD-10-CM | POA: Insufficient documentation

## 2017-02-10 LAB — GLUCOSE, POCT (MANUAL RESULT ENTRY): POC GLUCOSE: 323 mg/dL — AB (ref 70–99)

## 2017-02-10 MED ORDER — AZITHROMYCIN 250 MG PO TABS
ORAL_TABLET | ORAL | 0 refills | Status: DC
Start: 2017-02-10 — End: 2017-05-01

## 2017-02-10 MED ORDER — ALBUTEROL SULFATE (2.5 MG/3ML) 0.083% IN NEBU
2.5000 mg | INHALATION_SOLUTION | Freq: Once | RESPIRATORY_TRACT | Status: AC
Start: 2017-02-10 — End: 2017-02-10
  Administered 2017-02-10: 2.5 mg via RESPIRATORY_TRACT

## 2017-02-10 MED ORDER — MOMETASONE FURO-FORMOTEROL FUM 100-5 MCG/ACT IN AERO: 2.0000 | INHALATION_SPRAY | Freq: Two times a day (BID) | RESPIRATORY_TRACT | 3 refills | Status: DC

## 2017-02-10 MED ORDER — IPRATROPIUM-ALBUTEROL 0.5-2.5 (3) MG/3ML IN SOLN: 3.0000 mL | Freq: Four times a day (QID) | RESPIRATORY_TRACT | 3 refills | Status: DC | PRN

## 2017-02-10 MED ORDER — INSULIN GLARGINE 100 UNIT/ML ~~LOC~~ SOLN: 45.0000 [IU] | Freq: Two times a day (BID) | SUBCUTANEOUS | 3 refills | Status: DC

## 2017-02-10 MED ORDER — CEFTRIAXONE SODIUM 500 MG IJ SOLR
500.0000 mg | Freq: Once | INTRAMUSCULAR | Status: AC
  Administered 2017-02-10: 500 mg via INTRAMUSCULAR

## 2017-02-10 NOTE — Patient Instructions (Signed)

## 2017-02-10 NOTE — Progress Notes (Signed)
CC: Asthma
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
## 2017-02-12 ENCOUNTER — Encounter: Payer: Self-pay | Admitting: Family Medicine

## 2017-02-13 DIAGNOSIS — J4520 Mild intermittent asthma, uncomplicated: Secondary | ICD-10-CM | POA: Diagnosis not present

## 2017-02-16 ENCOUNTER — Other Ambulatory Visit: Payer: Self-pay | Admitting: Family Medicine

## 2017-02-16 DIAGNOSIS — E7800 Pure hypercholesterolemia, unspecified: Secondary | ICD-10-CM

## 2017-02-16 DIAGNOSIS — E1149 Type 2 diabetes mellitus with other diabetic neurological complication: Secondary | ICD-10-CM

## 2017-02-16 DIAGNOSIS — I10 Essential (primary) hypertension: Secondary | ICD-10-CM

## 2017-02-16 DIAGNOSIS — Z794 Long term (current) use of insulin: Secondary | ICD-10-CM

## 2017-02-23 ENCOUNTER — Other Ambulatory Visit: Payer: Self-pay | Admitting: Family Medicine

## 2017-02-23 DIAGNOSIS — I10 Essential (primary) hypertension: Secondary | ICD-10-CM

## 2017-02-24 ENCOUNTER — Telehealth: Payer: Self-pay | Admitting: Family Medicine

## 2017-02-24 ENCOUNTER — Telehealth: Payer: Self-pay | Admitting: *Deleted

## 2017-02-24 DIAGNOSIS — E7800 Pure hypercholesterolemia, unspecified: Secondary | ICD-10-CM

## 2017-02-24 DIAGNOSIS — F411 Generalized anxiety disorder: Secondary | ICD-10-CM

## 2017-02-24 DIAGNOSIS — E1165 Type 2 diabetes mellitus with hyperglycemia: Secondary | ICD-10-CM

## 2017-02-24 DIAGNOSIS — J4520 Mild intermittent asthma, uncomplicated: Secondary | ICD-10-CM

## 2017-02-24 DIAGNOSIS — E118 Type 2 diabetes mellitus with unspecified complications: Secondary | ICD-10-CM

## 2017-02-24 DIAGNOSIS — I10 Essential (primary) hypertension: Secondary | ICD-10-CM

## 2017-02-24 DIAGNOSIS — Z794 Long term (current) use of insulin: Secondary | ICD-10-CM

## 2017-02-24 DIAGNOSIS — K253 Acute gastric ulcer without hemorrhage or perforation: Secondary | ICD-10-CM

## 2017-02-24 DIAGNOSIS — G4709 Other insomnia: Secondary | ICD-10-CM

## 2017-02-24 MED ORDER — MOMETASONE FURO-FORMOTEROL FUM 100-5 MCG/ACT IN AERO: 2.0000 | INHALATION_SPRAY | Freq: Two times a day (BID) | RESPIRATORY_TRACT | 3 refills | Status: DC

## 2017-02-24 MED ORDER — BENAZEPRIL HCL 40 MG PO TABS: 40.0000 mg | ORAL_TABLET | Freq: Every day | ORAL | 0 refills | Status: DC

## 2017-02-24 MED ORDER — INSULIN GLARGINE 100 UNIT/ML ~~LOC~~ SOLN: 45.0000 [IU] | Freq: Two times a day (BID) | SUBCUTANEOUS | 3 refills | Status: DC

## 2017-02-24 MED ORDER — HYDROCHLOROTHIAZIDE 25 MG PO TABS: 25.0000 mg | ORAL_TABLET | Freq: Every day | ORAL | 1 refills | Status: DC

## 2017-02-24 MED ORDER — CARVEDILOL 12.5 MG PO TABS: 12.5000 mg | ORAL_TABLET | Freq: Two times a day (BID) | ORAL | 0 refills | Status: DC

## 2017-02-24 MED ORDER — INSULIN LISPRO 100 UNIT/ML ~~LOC~~ SOLN: 0.0000 [IU] | Freq: Three times a day (TID) | SUBCUTANEOUS | 11 refills | Status: DC

## 2017-02-24 MED ORDER — HYDROXYZINE HCL 25 MG PO TABS: 25.0000 mg | ORAL_TABLET | Freq: Three times a day (TID) | ORAL | 0 refills | Status: DC | PRN

## 2017-02-24 MED ORDER — ALBUTEROL SULFATE HFA 108 (90 BASE) MCG/ACT IN AERS: 1.0000 | INHALATION_SPRAY | Freq: Four times a day (QID) | RESPIRATORY_TRACT | 1 refills | Status: DC | PRN

## 2017-02-24 MED ORDER — OMEPRAZOLE 20 MG PO CPDR: 20.0000 mg | DELAYED_RELEASE_CAPSULE | Freq: Every day | ORAL | 0 refills | Status: DC

## 2017-02-24 MED ORDER — IPRATROPIUM-ALBUTEROL 0.5-2.5 (3) MG/3ML IN SOLN: 3.0000 mL | Freq: Four times a day (QID) | RESPIRATORY_TRACT | 3 refills | Status: DC | PRN

## 2017-02-24 MED ORDER — TRAZODONE HCL 100 MG PO TABS: 50.0000 mg | ORAL_TABLET | Freq: Every day | ORAL | 0 refills | Status: DC

## 2017-02-24 MED ORDER — GLIMEPIRIDE 4 MG PO TABS: 4.0000 mg | ORAL_TABLET | Freq: Two times a day (BID) | ORAL | 1 refills | Status: DC

## 2017-02-24 MED ORDER — SIMVASTATIN 10 MG PO TABS: 10.0000 mg | ORAL_TABLET | Freq: Every day | ORAL | 0 refills | Status: DC

## 2017-02-24 NOTE — Telephone Encounter (Signed)
Pt aware of paperwork: to come in office and sign. She addresses concern with blood sugar level and medication.   Pt states: Blood sugar this morning was 90. Otherwise blood sugars have been "low 100's". Pt advised levels are WNL, should not drop below 77. Eat snack prior to bed. Takes insulin BID ac/hs. Do you want her to adjust insulin? She is no longer taking Prednisone.

## 2017-02-24 NOTE — Telephone Encounter (Signed)
All chronic medication refills were sent. Did not refill glimepiride due to allergy alert. Will forward to Dr. Venetia NightAmao for review

## 2017-02-24 NOTE — Telephone Encounter (Signed)
Continue current dose of Lantus.  She should notice a decrease in need of Humalog given she is off prednisone.

## 2017-02-24 NOTE — Telephone Encounter (Signed)
Message from Dr. Venetia NightAmao Left message on voicemail.

## 2017-02-24 NOTE — Telephone Encounter (Signed)
[facility] [address] for all her prescription

## 2017-02-24 NOTE — Telephone Encounter (Signed)
Refilled glimepiride.

## 2017-03-07 ENCOUNTER — Ambulatory Visit: Payer: BLUE CROSS/BLUE SHIELD | Admitting: Family Medicine

## 2017-03-21 ENCOUNTER — Ambulatory Visit: Payer: Self-pay | Attending: Family Medicine

## 2017-03-21 MED FILL — ?CARVEDILOL 12.5 MG TABLET: 12.5 | 30 days supply | Qty: 60 | Fill #0

## 2017-03-21 MED FILL — ?SIMVASTATIN 10 MG TABS: 10 | 30 days supply | Qty: 30 | Fill #0

## 2017-03-21 MED FILL — POTASSIUM CL ER 20 MEQ TAB: 20 | 30 days supply | Qty: 30 | Fill #0

## 2017-03-21 MED FILL — GABAPENTIN 300 MG CAPSULE: 300 | 30 days supply | Qty: 60 | Fill #0

## 2017-03-21 MED FILL — BENAZEPRIL HCL 40 MG TABLET: 40 | 30 days supply | Qty: 30 | Fill #0

## 2017-03-21 MED FILL — HYDROCHLOROTHIAZIDE 25 MG T: 25 | 30 days supply | Qty: 30 | Fill #0

## 2017-03-21 MED FILL — ?GLIMEPIRIDE 4 MG TABLET: 4 | 30 days supply | Qty: 60 | Fill #0

## 2017-03-21 MED FILL — !VENTOLIN HFA INHALER: 108 (90 BAS | 25 days supply | Qty: 18 | Fill #0

## 2017-03-21 MED FILL — traZODone HCL 100 MG TABS: 100 | 30 days supply | Qty: 15 | Fill #0

## 2017-03-21 MED FILL — ?OMEPRAZOLE DR 20MG CAPSULE: 20 | 30 days supply | Qty: 30 | Fill #0

## 2017-03-21 MED FILL — ?HUMALOG 100 UNITS/ML VIAL: 100 | 27 days supply | Qty: 10 | Fill #0

## 2017-03-21 MED FILL — !LANTUS 100 UNITS/ML VIAL: 100 | 22 days supply | Qty: 20 | Fill #0

## 2017-03-30 ENCOUNTER — Ambulatory Visit: Payer: Self-pay | Attending: Family Medicine

## 2017-03-30 MED FILL — DULERA 100 MCG/5 MCG INH: 100-5 | 30 days supply | Qty: 13 | Fill #0

## 2017-04-17 MED FILL — !LANTUS 100 UNITS/ML VIAL: 100 | 22 days supply | Qty: 20 | Fill #1

## 2017-05-01 ENCOUNTER — Ambulatory Visit: Payer: Self-pay | Attending: Family Medicine | Admitting: Family Medicine

## 2017-05-01 ENCOUNTER — Encounter: Payer: Self-pay | Admitting: Family Medicine

## 2017-05-01 VITALS — BP 151/90 | HR 83 | Temp 98.3°F | Ht 65.0 in | Wt 195.4 lb

## 2017-05-01 DIAGNOSIS — Z9889 Other specified postprocedural states: Secondary | ICD-10-CM | POA: Insufficient documentation

## 2017-05-01 DIAGNOSIS — K219 Gastro-esophageal reflux disease without esophagitis: Secondary | ICD-10-CM | POA: Insufficient documentation

## 2017-05-01 DIAGNOSIS — E1149 Type 2 diabetes mellitus with other diabetic neurological complication: Secondary | ICD-10-CM

## 2017-05-01 DIAGNOSIS — E7800 Pure hypercholesterolemia, unspecified: Secondary | ICD-10-CM | POA: Insufficient documentation

## 2017-05-01 DIAGNOSIS — E118 Type 2 diabetes mellitus with unspecified complications: Secondary | ICD-10-CM

## 2017-05-01 DIAGNOSIS — E1165 Type 2 diabetes mellitus with hyperglycemia: Secondary | ICD-10-CM | POA: Insufficient documentation

## 2017-05-01 DIAGNOSIS — Z886 Allergy status to analgesic agent status: Secondary | ICD-10-CM | POA: Insufficient documentation

## 2017-05-01 DIAGNOSIS — K253 Acute gastric ulcer without hemorrhage or perforation: Secondary | ICD-10-CM | POA: Insufficient documentation

## 2017-05-01 DIAGNOSIS — Z882 Allergy status to sulfonamides status: Secondary | ICD-10-CM | POA: Insufficient documentation

## 2017-05-01 DIAGNOSIS — Z794 Long term (current) use of insulin: Secondary | ICD-10-CM | POA: Insufficient documentation

## 2017-05-01 DIAGNOSIS — Z79899 Other long term (current) drug therapy: Secondary | ICD-10-CM | POA: Insufficient documentation

## 2017-05-01 DIAGNOSIS — I10 Essential (primary) hypertension: Secondary | ICD-10-CM | POA: Insufficient documentation

## 2017-05-01 DIAGNOSIS — F411 Generalized anxiety disorder: Secondary | ICD-10-CM | POA: Insufficient documentation

## 2017-05-01 DIAGNOSIS — J4520 Mild intermittent asthma, uncomplicated: Secondary | ICD-10-CM | POA: Insufficient documentation

## 2017-05-01 LAB — POCT GLYCOSYLATED HEMOGLOBIN (HGB A1C): Hemoglobin A1C: 9.1

## 2017-05-01 LAB — GLUCOSE, POCT (MANUAL RESULT ENTRY): POC Glucose: 259 mg/dl — AB (ref 70–99)

## 2017-05-01 MED ORDER — FLUTICASONE PROPIONATE 50 MCG/ACT NA SUSP: 2.0000 | Freq: Every day | NASAL | 6 refills | Status: DC

## 2017-05-01 MED ORDER — LORATADINE 10 MG PO TABS: 10.0000 mg | ORAL_TABLET | Freq: Every day | ORAL | 3 refills | Status: DC

## 2017-05-01 MED ORDER — HYDROXYZINE HCL 25 MG PO TABS: 25.0000 mg | ORAL_TABLET | Freq: Three times a day (TID) | ORAL | 0 refills | Status: DC | PRN

## 2017-05-01 MED ORDER — INSULIN GLARGINE 100 UNIT/ML ~~LOC~~ SOLN: 53.0000 [IU] | Freq: Two times a day (BID) | SUBCUTANEOUS | 3 refills | Status: DC

## 2017-05-01 MED ORDER — SIMVASTATIN 10 MG PO TABS: 10.0000 mg | ORAL_TABLET | Freq: Every day | ORAL | 0 refills | Status: DC

## 2017-05-01 MED ORDER — ALBUTEROL SULFATE HFA 108 (90 BASE) MCG/ACT IN AERS: 1.0000 | INHALATION_SPRAY | Freq: Four times a day (QID) | RESPIRATORY_TRACT | 1 refills | Status: DC | PRN

## 2017-05-01 MED ORDER — LIRAGLUTIDE 18 MG/3ML ~~LOC~~ SOPN: PEN_INJECTOR | SUBCUTANEOUS | 3 refills | Status: DC

## 2017-05-01 MED ORDER — OMEPRAZOLE 20 MG PO CPDR: 20.0000 mg | DELAYED_RELEASE_CAPSULE | Freq: Every day | ORAL | 0 refills | Status: DC

## 2017-05-01 MED ORDER — BENAZEPRIL HCL 40 MG PO TABS: 40.0000 mg | ORAL_TABLET | Freq: Every day | ORAL | 0 refills | Status: DC

## 2017-05-01 MED ORDER — GABAPENTIN 300 MG PO CAPS: 600.0000 mg | ORAL_CAPSULE | Freq: Two times a day (BID) | ORAL | 3 refills | Status: DC

## 2017-05-01 MED ORDER — MOMETASONE FURO-FORMOTEROL FUM 100-5 MCG/ACT IN AERO: 2.0000 | INHALATION_SPRAY | Freq: Two times a day (BID) | RESPIRATORY_TRACT | 3 refills | Status: DC

## 2017-05-01 MED ORDER — CARVEDILOL 12.5 MG PO TABS: 12.5000 mg | ORAL_TABLET | Freq: Two times a day (BID) | ORAL | 0 refills | Status: DC

## 2017-05-01 MED ORDER — GLIMEPIRIDE 4 MG PO TABS: 4.0000 mg | ORAL_TABLET | Freq: Two times a day (BID) | ORAL | 1 refills | Status: DC

## 2017-05-01 MED FILL — FLUTICASONE PROP 50 MCG SPR: 50 | 30 days supply | Qty: 16 | Fill #0

## 2017-05-01 MED FILL — !VENTOLIN HFA INHALER: 108 (90 BAS | 25 days supply | Qty: 18 | Fill #0

## 2017-05-01 MED FILL — **VICTOZA 18 MG/3 ML INJECT: 18 | 25 days supply | Qty: 3 | Fill #0

## 2017-05-01 MED FILL — !LANTUS 100 UNITS/ML VIAL: 100 | 28 days supply | Qty: 30 | Fill #0

## 2017-05-01 MED FILL — ?OMEPRAZOLE DR 20MG CAPSULE: 20 | 30 days supply | Qty: 30 | Fill #0

## 2017-05-01 MED FILL — ?GLIMEPIRIDE 4 MG TABLET: 4 | 30 days supply | Qty: 60 | Fill #0

## 2017-05-01 MED FILL — BENAZEPRIL HCL 40 MG TABLET: 40 | 30 days supply | Qty: 30 | Fill #0

## 2017-05-01 MED FILL — ?SIMVASTATIN 10 MG TABS: 10 | 30 days supply | Qty: 30 | Fill #0

## 2017-05-01 MED FILL — GABAPENTIN 300 MG CAPSULE: 300 | 30 days supply | Qty: 120 | Fill #0

## 2017-05-01 MED FILL — ?CARVEDILOL 12.5 MG TABLET: 12.5 | 30 days supply | Qty: 60 | Fill #0

## 2017-05-01 MED FILL — !DULERA 100 MCG/5 MCG INH: 100-5 | 30 days supply | Qty: 13 | Fill #0

## 2017-05-01 NOTE — Patient Instructions (Signed)
Diabetes Mellitus and Nutrition When you have diabetes (diabetes mellitus), it is very important to have healthy eating habits because your blood sugar (glucose) levels are greatly affected by what you eat and drink. Eating healthy foods in the appropriate amounts, at about the same times every day, can help you:  Control your blood glucose.  Lower your risk of heart disease.  Improve your blood pressure.  Reach or maintain a healthy weight.  Every person with diabetes is different, and each person has different needs for a meal plan. Your health care provider may recommend that you work with a diet and nutrition specialist (dietitian) to make a meal plan that is best for you. Your meal plan may vary depending on factors such as:  The calories you need.  The medicines you take.  Your weight.  Your blood glucose, blood pressure, and cholesterol levels.  Your activity level.  Other health conditions you have, such as heart or kidney disease.  How do carbohydrates affect me? Carbohydrates affect your blood glucose level more than any other type of food. Eating carbohydrates naturally increases the amount of glucose in your blood. Carbohydrate counting is a method for keeping track of how many carbohydrates you eat. Counting carbohydrates is important to keep your blood glucose at a healthy level, especially if you use insulin or take certain oral diabetes medicines. It is important to know how many carbohydrates you can safely have in each meal. This is different for every person. Your dietitian can help you calculate how many carbohydrates you should have at each meal and for snack. Foods that contain carbohydrates include:  Bread, cereal, rice, pasta, and crackers.  Potatoes and corn.  Peas, beans, and lentils.  Milk and yogurt.  Fruit and juice.  Desserts, such as cakes, cookies, ice cream, and candy.  How does alcohol affect me? Alcohol can cause a sudden decrease in blood  glucose (hypoglycemia), especially if you use insulin or take certain oral diabetes medicines. Hypoglycemia can be a life-threatening condition. Symptoms of hypoglycemia (sleepiness, dizziness, and confusion) are similar to symptoms of having too much alcohol. If your health care provider says that alcohol is safe for you, follow these guidelines:  Limit alcohol intake to no more than 1 drink per day for nonpregnant women and 2 drinks per day for men. One drink equals 12 oz of beer, 5 oz of wine, or 1 oz of hard liquor.  Do not drink on an empty stomach.  Keep yourself hydrated with water, diet soda, or unsweetened iced tea.  Keep in mind that regular soda, juice, and other mixers may contain a lot of sugar and must be counted as carbohydrates.  What are tips for following this plan? Reading food labels  Start by checking the serving size on the label. The amount of calories, carbohydrates, fats, and other nutrients listed on the label are based on one serving of the food. Many foods contain more than one serving per package.  Check the total grams (g) of carbohydrates in one serving. You can calculate the number of servings of carbohydrates in one serving by dividing the total carbohydrates by 15. For example, if a food has 30 g of total carbohydrates, it would be equal to 2 servings of carbohydrates.  Check the number of grams (g) of saturated and trans fats in one serving. Choose foods that have low or no amount of these fats.  Check the number of milligrams (mg) of sodium in one serving. Most people   should limit total sodium intake to less than 2,300 mg per day.  Always check the nutrition information of foods labeled as "low-fat" or "nonfat". These foods may be higher in added sugar or refined carbohydrates and should be avoided.  Talk to your dietitian to identify your daily goals for nutrients listed on the label. Shopping  Avoid buying canned, premade, or processed foods. These  foods tend to be high in fat, sodium, and added sugar.  Shop around the outside edge of the grocery store. This includes fresh fruits and vegetables, bulk grains, fresh meats, and fresh dairy. Cooking  Use low-heat cooking methods, such as baking, instead of high-heat cooking methods like deep frying.  Cook using healthy oils, such as olive, canola, or sunflower oil.  Avoid cooking with butter, cream, or high-fat meats. Meal planning  Eat meals and snacks regularly, preferably at the same times every day. Avoid going long periods of time without eating.  Eat foods high in fiber, such as fresh fruits, vegetables, beans, and whole grains. Talk to your dietitian about how many servings of carbohydrates you can eat at each meal.  Eat 4-6 ounces of lean protein each day, such as lean meat, chicken, fish, eggs, or tofu. 1 ounce is equal to 1 ounce of meat, chicken, or fish, 1 egg, or 1/4 cup of tofu.  Eat some foods each day that contain healthy fats, such as avocado, nuts, seeds, and fish. Lifestyle   Check your blood glucose regularly.  Exercise at least 30 minutes 5 or more days each week, or as told by your health care provider.  Take medicines as told by your health care provider.  Do not use any products that contain nicotine or tobacco, such as cigarettes and e-cigarettes. If you need help quitting, ask your health care provider.  Work with a counselor or diabetes educator to identify strategies to manage stress and any emotional and social challenges. What are some questions to ask my health care provider?  Do I need to meet with a diabetes educator?  Do I need to meet with a dietitian?  What number can I call if I have questions?  When are the best times to check my blood glucose? Where to find more information:  American Diabetes Association: diabetes.org/food-and-fitness/food  Academy of Nutrition and Dietetics:  www.eatright.org/resources/health/diseases-and-conditions/diabetes  National Institute of Diabetes and Digestive and Kidney Diseases (NIH): www.niddk.nih.gov/health-information/diabetes/overview/diet-eating-physical-activity Summary  A healthy meal plan will help you control your blood glucose and maintain a healthy lifestyle.  Working with a diet and nutrition specialist (dietitian) can help you make a meal plan that is best for you.  Keep in mind that carbohydrates and alcohol have immediate effects on your blood glucose levels. It is important to count carbohydrates and to use alcohol carefully. This information is not intended to replace advice given to you by your health care provider. Make sure you discuss any questions you have with your health care provider. Document Released: 11/11/2004 Document Revised: 03/21/2016 Document Reviewed: 03/21/2016 Elsevier Interactive Patient Education  2018 [employer]  
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
## 2017-05-08 ENCOUNTER — Telehealth: Payer: Self-pay | Admitting: Family Medicine

## 2017-05-08 ENCOUNTER — Emergency Department (HOSPITAL_COMMUNITY)
Admission: EM | Admit: 2017-05-08 | Discharge: 2017-05-08 | Disposition: A | Discharge status: Home / Self Care | Payer: Self-pay | Attending: Emergency Medicine | Admitting: Emergency Medicine

## 2017-05-08 ENCOUNTER — Other Ambulatory Visit: Payer: Self-pay

## 2017-05-08 ENCOUNTER — Encounter (HOSPITAL_COMMUNITY): Payer: Self-pay | Admitting: Emergency Medicine

## 2017-05-08 DIAGNOSIS — R112 Nausea with vomiting, unspecified: Secondary | ICD-10-CM

## 2017-05-08 DIAGNOSIS — J45909 Unspecified asthma, uncomplicated: Secondary | ICD-10-CM | POA: Insufficient documentation

## 2017-05-08 DIAGNOSIS — E860 Dehydration: Secondary | ICD-10-CM | POA: Insufficient documentation

## 2017-05-08 DIAGNOSIS — Z87891 Personal history of nicotine dependence: Secondary | ICD-10-CM | POA: Insufficient documentation

## 2017-05-08 DIAGNOSIS — Z794 Long term (current) use of insulin: Secondary | ICD-10-CM | POA: Insufficient documentation

## 2017-05-08 DIAGNOSIS — R1013 Epigastric pain: Secondary | ICD-10-CM | POA: Insufficient documentation

## 2017-05-08 DIAGNOSIS — E876 Hypokalemia: Secondary | ICD-10-CM | POA: Insufficient documentation

## 2017-05-08 DIAGNOSIS — I10 Essential (primary) hypertension: Secondary | ICD-10-CM | POA: Insufficient documentation

## 2017-05-08 DIAGNOSIS — Z79899 Other long term (current) drug therapy: Secondary | ICD-10-CM | POA: Insufficient documentation

## 2017-05-08 DIAGNOSIS — E119 Type 2 diabetes mellitus without complications: Secondary | ICD-10-CM | POA: Insufficient documentation

## 2017-05-08 LAB — I-STAT VENOUS BLOOD GAS, ED
Acid-Base Excess: 5 mmol/L — ABNORMAL HIGH (ref 0.0–2.0)
BICARBONATE: 31 mmol/L — AB (ref 20.0–28.0)
O2 SAT: 65 %
PCO2 VEN: 51.7 mmHg (ref 44.0–60.0)
PO2 VEN: 35 mmHg (ref 32.0–45.0)
TCO2: 33 mmol/L — ABNORMAL HIGH (ref 22–32)
pH, Ven: 7.387 (ref 7.250–7.430)

## 2017-05-08 LAB — COMPREHENSIVE METABOLIC PANEL
ALT: 17 U/L (ref 14–54)
AST: 24 U/L (ref 15–41)
Albumin: 4.1 g/dL (ref 3.5–5.0)
Alkaline Phosphatase: 57 U/L (ref 38–126)
Anion gap: 16 — ABNORMAL HIGH (ref 5–15)
BILIRUBIN TOTAL: 1.2 mg/dL (ref 0.3–1.2)
BUN: 12 mg/dL (ref 6–20)
CO2: 27 mmol/L (ref 22–32)
CREATININE: 1.04 mg/dL — AB (ref 0.44–1.00)
Calcium: 9.8 mg/dL (ref 8.9–10.3)
Chloride: 95 mmol/L — ABNORMAL LOW (ref 101–111)
GFR, EST NON AFRICAN AMERICAN: 59 mL/min — AB (ref >60)
Glucose, Bld: 95 mg/dL (ref 65–99)
Potassium: 2.8 mmol/L — ABNORMAL LOW (ref 3.5–5.1)
Sodium: 138 mmol/L (ref 135–145)
TOTAL PROTEIN: 7.5 g/dL (ref 6.5–8.1)

## 2017-05-08 LAB — CBC
HCT: 48.3 % — ABNORMAL HIGH (ref 36.0–46.0)
Hemoglobin: 16.5 g/dL — ABNORMAL HIGH (ref 12.0–15.0)
MCH: 31.7 pg (ref 26.0–34.0)
MCHC: 34.2 g/dL (ref 30.0–36.0)
MCV: 92.7 fL (ref 78.0–100.0)
PLATELETS: 279 10*3/uL (ref 150–400)
RBC: 5.21 MIL/uL — ABNORMAL HIGH (ref 3.87–5.11)
RDW: 13.3 % (ref 11.5–15.5)
WBC: 8.2 10*3/uL (ref 4.0–10.5)

## 2017-05-08 LAB — URINALYSIS, ROUTINE W REFLEX MICROSCOPIC
Bacteria, UA: NONE SEEN
Bilirubin Urine: NEGATIVE
GLUCOSE, UA: NEGATIVE mg/dL
Ketones, ur: 5 mg/dL — AB
LEUKOCYTES UA: NEGATIVE
NITRITE: NEGATIVE
PH: 5 (ref 5.0–8.0)
Protein, ur: 100 mg/dL — AB
SPECIFIC GRAVITY, URINE: 1.028 (ref 1.005–1.030)

## 2017-05-08 LAB — CBG MONITORING, ED
GLUCOSE-CAPILLARY: 90 mg/dL (ref 65–99)
Glucose-Capillary: 124 mg/dL — ABNORMAL HIGH (ref 65–99)

## 2017-05-08 LAB — LIPASE, BLOOD: Lipase: 36 U/L (ref 11–51)

## 2017-05-08 MED ORDER — POTASSIUM CHLORIDE 10 MEQ/100ML IV SOLN
10.0000 meq | INTRAVENOUS | Status: DC
  Administered 2017-05-08 (×2): 10 meq via INTRAVENOUS
  Filled 2017-05-08 (×2): qty 100

## 2017-05-08 MED ORDER — SODIUM CHLORIDE 0.9 % IV BOLUS (SEPSIS)
500.0000 mL | Freq: Once | INTRAVENOUS | Status: AC
  Administered 2017-05-08: 500 mL via INTRAVENOUS

## 2017-05-08 MED ORDER — METOCLOPRAMIDE HCL 5 MG/ML IJ SOLN
10.0000 mg | Freq: Once | INTRAMUSCULAR | Status: AC
  Administered 2017-05-08: 10 mg via INTRAVENOUS
  Filled 2017-05-08: qty 2

## 2017-05-08 MED ORDER — SODIUM CHLORIDE 0.9 % IV BOLUS (SEPSIS)
1000.0000 mL | Freq: Once | INTRAVENOUS | Status: AC
  Administered 2017-05-08: 1000 mL via INTRAVENOUS

## 2017-05-08 MED ORDER — METOCLOPRAMIDE HCL 10 MG PO TABS: 10.0000 mg | ORAL_TABLET | Freq: Four times a day (QID) | ORAL | 0 refills | Status: DC | PRN

## 2017-05-08 MED ORDER — POTASSIUM CHLORIDE CRYS ER 20 MEQ PO TBCR
40.0000 meq | EXTENDED_RELEASE_TABLET | Freq: Once | ORAL | Status: AC
  Administered 2017-05-08: 40 meq via ORAL
  Filled 2017-05-08: qty 2

## 2017-05-08 MED ORDER — FENTANYL CITRATE (PF) 100 MCG/2ML IJ SOLN
50.0000 ug | Freq: Once | INTRAMUSCULAR | Status: AC
  Administered 2017-05-08: 50 ug via INTRAVENOUS
  Filled 2017-05-08: qty 2

## 2017-05-08 MED ORDER — POTASSIUM CHLORIDE CRYS ER 20 MEQ PO TBCR: 20.0000 meq | EXTENDED_RELEASE_TABLET | Freq: Every day | ORAL | 0 refills | Status: DC

## 2017-05-08 NOTE — Telephone Encounter (Signed)
I don't understand this message. Could you please follow up on this? Thank you.
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
## 2017-05-08 NOTE — Telephone Encounter (Signed)
Mikle BosworthCarlos what does this message mean.

## 2017-05-08 NOTE — ED Triage Notes (Addendum)
Started taking Victoza on Friday and states she felt fatigued and mild nausea.  Increased nausea and vomiting since Saturday with mid upper abd pain.  Vomited x 6 in the last 24 hours.

## 2017-05-08 NOTE — Discharge Instructions (Signed)
1. Medications: Reglan, Klor Con (potassium), usual home medications 2. Treatment: rest, drink plenty of fluids,  3. Follow Up: Please followup with your primary doctor in this morning for discussion of your diagnoses and further evaluation after today's visit; Please return to the ER for fever, persistent vomiting, worsening pain or other concerns.

## 2017-05-08 NOTE — Telephone Encounter (Signed)
Pt came to the office to make an appt with the pcp but is not appt available, she need to talk about her l discharge for her to make this appointment for further discussion of her insulin management. , please follow up

## 2017-05-09 NOTE — Telephone Encounter (Signed)
She was discharge from the ED and was told to talk to the pcp about her insulin, is not appt available, to schedule one for the Pt, so she ask the pcp or the nurse to call her back for futher discussion of management her insulin, please follow up

## 2017-05-10 NOTE — Telephone Encounter (Signed)
Noted  

## 2017-05-10 NOTE — Telephone Encounter (Signed)
Patient has HFU on 05/18/17 with tiffany.

## 2017-05-18 ENCOUNTER — Ambulatory Visit: Payer: Self-pay | Attending: Family Medicine | Admitting: Physician Assistant

## 2017-05-18 VITALS — BP 136/86 | HR 79 | Temp 98.6°F | Resp 18 | Ht 64.0 in | Wt 197.2 lb

## 2017-05-18 DIAGNOSIS — J45909 Unspecified asthma, uncomplicated: Secondary | ICD-10-CM | POA: Insufficient documentation

## 2017-05-18 DIAGNOSIS — E1143 Type 2 diabetes mellitus with diabetic autonomic (poly)neuropathy: Secondary | ICD-10-CM | POA: Insufficient documentation

## 2017-05-18 DIAGNOSIS — E1165 Type 2 diabetes mellitus with hyperglycemia: Secondary | ICD-10-CM

## 2017-05-18 DIAGNOSIS — Z794 Long term (current) use of insulin: Secondary | ICD-10-CM | POA: Insufficient documentation

## 2017-05-18 DIAGNOSIS — E785 Hyperlipidemia, unspecified: Secondary | ICD-10-CM | POA: Insufficient documentation

## 2017-05-18 DIAGNOSIS — Z79899 Other long term (current) drug therapy: Secondary | ICD-10-CM | POA: Insufficient documentation

## 2017-05-18 DIAGNOSIS — K3184 Gastroparesis: Secondary | ICD-10-CM | POA: Insufficient documentation

## 2017-05-18 DIAGNOSIS — R112 Nausea with vomiting, unspecified: Secondary | ICD-10-CM

## 2017-05-18 DIAGNOSIS — I10 Essential (primary) hypertension: Secondary | ICD-10-CM | POA: Insufficient documentation

## 2017-05-18 LAB — GLUCOSE, POCT (MANUAL RESULT ENTRY): POC GLUCOSE: 117 mg/dL — AB (ref 70–99)

## 2017-05-18 NOTE — Patient Instructions (Signed)
Stay off Victoza for now Gradually progress back to your normal diet

## 2017-05-18 NOTE — Progress Notes (Signed)
Hospital f/up  vomiting & nauseas comes & goes

## 2017-05-18 NOTE — Progress Notes (Signed)
Chief Complaint: ED follow up  Subjective: His is a 58 year old female with a history of diabetes mellitus type 2, located by neuropathy and gastroparesis, peptic ulcer disease, hypertension, hyperlipidemia and asthma. She is here for emergency Department follow-up. On 05/08/2017 she presented to the hospital with increasing vomiting and nausea. It has gradually been persistent over 3-4 days. Just prior to her episode of nausea Victoza was added to her diabetes regimen. Immediately she suffered nausea and later vomiting (took for 3 days). Her potassium in the emergency department was 2.8. Creatinine 1.0. Ketones 5. Glucose only 124. She was treated with potassium, normal saline, Reglan and fentanyl.  Her blood sugars have been in the 110s lately off the Victoza. She has been compliant with her Lantus and her sliding scale insulin. She does admit that she is fasting intermittently for the season of Lent. She also has not had increase in appetite since her episodes of nausea and vomiting. Sometimes misses SSI and BS checks due to her work schedule. N and V has subsided. She is slowly progressing her diet.    ROS:  GEN: denies fever or chills, denies change in weight Skin: denies lesions or rashes HEENT: denies headache, earache, epistaxis, sore throat, or neck pain LUNGS: denies SHOB, dyspnea, PND, orthopnea CV: denies CP or palpitations ABD: no abd pain, + N and V but better  Objective:  Vitals:   05/18/17 1458  BP: 136/86  Pulse: 79  Resp: 18  Temp: 98.6 F (37 C)  TempSrc: Oral  SpO2: 95%  Weight: 197 lb 3.2 oz (89.4 kg)  Height: '5\' 4"'$  (1.626 m)    Physical Exam:benign  General: in no acute distress. Heart: Normal  s1 &s2  Regular rate and rhythm, without murmurs, rubs, gallops. Lungs: Clear to auscultation bilaterally. Abdomen: Soft, nontender, nondistended, positive bowel sounds.  Pertinent Lab Results:CBG 117   Medications: Prior to Admission medications   Medication  Sig Start Date End Date Taking? Authorizing Provider  albuterol (PROVENTIL HFA;VENTOLIN HFA) 108 (90 Base) MCG/ACT inhaler Inhale 1-2 puffs into the lungs every 6 (six) hours as needed for wheezing or shortness of breath. 05/01/17   Charlott Rakes, MD  benazepril (LOTENSIN) 40 MG tablet Take 1 tablet (40 mg total) by mouth daily. 05/01/17   Charlott Rakes, MD  blood glucose meter kit and supplies KIT Dispense based on patient and insurance preference. Use up to four times daily as directed. (FOR ICD-9 250.00, 250.01). 04/18/15   Regalado, Belkys A, MD  carvedilol (COREG) 12.5 MG tablet Take 1 tablet (12.5 mg total) by mouth 2 (two) times daily with a meal. 05/01/17   Charlott Rakes, MD  cyclobenzaprine (FLEXERIL) 10 MG tablet Take 1 tablet (10 mg total) by mouth 2 (two) times daily as needed for muscle spasms. 07/28/16   Charlott Rakes, MD  fluticasone (FLONASE) 50 MCG/ACT nasal spray Place 2 sprays into both nostrils daily. 05/01/17   Charlott Rakes, MD  gabapentin (NEURONTIN) 300 MG capsule Take 2 capsules (600 mg total) by mouth 2 (two) times daily. 05/01/17   Charlott Rakes, MD  glimepiride (AMARYL) 4 MG tablet Take 1 tablet (4 mg total) by mouth 2 (two) times daily. 05/01/17   Charlott Rakes, MD  glucose blood (RELION GLUCOSE TEST STRIPS) test strip Use as instructed 05/24/16   Charlott Rakes, MD  hydrochlorothiazide (HYDRODIURIL) 25 MG tablet Take 1 tablet (25 mg total) by mouth daily. 02/24/17   Charlott Rakes, MD  hydrOXYzine (ATARAX/VISTARIL) 25 MG tablet Take 1 tablet (25  mg total) by mouth 3 (three) times daily as needed. 05/01/17   Charlott Rakes, MD  insulin glargine (LANTUS) 100 UNIT/ML injection Inject 0.53 mLs (53 Units total) into the skin 2 (two) times daily. 05/01/17   Charlott Rakes, MD  insulin lispro (HUMALOG) 100 UNIT/ML injection Inject 0-0.12 mLs (0-12 Units total) into the skin 3 (three) times daily before meals. 02/24/17   Charlott Rakes, MD  Insulin Syringe-Needle U-100 31G X 5/16"  1 ML MISC 1 each by Does not apply route 4 (four) times daily. 10/04/16   Charlott Rakes, MD  ipratropium-albuterol (DUONEB) 0.5-2.5 (3) MG/3ML SOLN Take 3 mLs by nebulization every 6 (six) hours as needed. 02/24/17   Charlott Rakes, MD  levonorgestrel (MIRENA) 20 MCG/24HR IUD 1 each by Intrauterine route once.    [provider]  liraglutide (VICTOZA) 18 MG/3ML SOPN Inject subcutaneously daily with breakfast 0.6 mg for 1 week then 1.2 mg for 1 week then 1.8 mg thereafter Patient taking differently: Inject 0.6 mg into the skin daily. Use 0.6 mg for 1 week then 1.2 mg for 1 week then 1.8 mg thereafter 05/01/17   Charlott Rakes, MD  loratadine (CLARITIN) 10 MG tablet Take 1 tablet (10 mg total) by mouth daily. 05/01/17   Charlott Rakes, MD  metoCLOPramide (REGLAN) 10 MG tablet Take 1 tablet (10 mg total) by mouth every 6 (six) hours as needed for nausea (nausea/headache). 05/08/17   Muthersbaugh, Jarrett Soho, PA-C  mometasone-formoterol (DULERA) 100-5 MCG/ACT AERO Inhale 2 puffs into the lungs 2 (two) times daily. 05/01/17   Charlott Rakes, MD  Multiple Vitamin (MULTIVITAMIN WITH MINERALS) TABS tablet Take 1 tablet by mouth daily.    [provider]  norethindrone (AYGESTIN) 5 MG tablet Take 1 tablet (5 mg total) by mouth daily. Patient not taking: Reported on 01/27/2017 10/18/16   Salvadore Dom, MD  omeprazole (PRILOSEC) 20 MG capsule Take 1 capsule (20 mg total) by mouth daily. 05/01/17   Charlott Rakes, MD  potassium chloride SA (K-DUR,KLOR-CON) 20 MEQ tablet Take 1 tablet (20 mEq total) by mouth daily. 05/08/17   Muthersbaugh, Jarrett Soho, PA-C  RELION LANCETS MICRO-THIN 33G MISC 1 each by Does not apply route at bedtime. 07/24/15   Charlott Rakes, MD  simvastatin (ZOCOR) 10 MG tablet Take 1 tablet (10 mg total) by mouth daily. 05/01/17   Charlott Rakes, MD  Syringe/Needle, Disp, (SYRINGE 3CC/21GX1-1/4") 21G X 1-1/4" 3 ML MISC Inject 1 application as directed daily. 04/18/15   Regalado, Belkys  A, MD  traMADol (ULTRAM) 50 MG tablet Take 1 tablet (50 mg total) by mouth every 12 (twelve) hours as needed. 07/28/16   Charlott Rakes, MD  traZODone (DESYREL) 100 MG tablet Take 0.5 tablets (50 mg total) by mouth at bedtime. 02/24/17   Charlott Rakes, MD    Assessment: 1. Nausea/Vomiting likely related to Victoza 2. DM 2 Insulin requiring 3. Diabetic Gastroparesis  Plan: Stay off Victoza for now Cont Lantus and SSI Reglan Keep a BS log and bring to next appt  Follow up:2 weeks  The patient was given clear instructions to go to ER or return to medical center if symptoms don't improve, worsen or new problems develop. The patient verbalized understanding. The patient was told to call to get lab results if they haven't heard anything in the next week.   This note has been created with Surveyor, quantity. Any transcriptional errors are unintentional.   Zettie Pho, PA-C 05/18/2017, 3:25 PM

## 2017-05-22 MED FILL — ?HUMALOG 100 UNITS/ML VIAL: 100 | 27 days supply | Qty: 10 | Fill #1

## 2017-05-29 MED FILL — ?GLIMEPIRIDE 4 MG TABLET: 4 | 30 days supply | Qty: 60 | Fill #1

## 2017-05-29 MED FILL — !LANTUS 100 UNITS/ML VIAL: 100 | 28 days supply | Qty: 30 | Fill #1

## 2017-06-05 ENCOUNTER — Telehealth: Payer: Self-pay

## 2017-06-05 ENCOUNTER — Other Ambulatory Visit: Payer: Self-pay

## 2017-06-05 MED ORDER — METOCLOPRAMIDE HCL 10 MG PO TABS: 10.0000 mg | ORAL_TABLET | Freq: Four times a day (QID) | ORAL | 0 refills | Status: DC | PRN

## 2017-06-05 MED FILL — ?METOCLOPRAMIDE 10 MG TABLE: 10 | 5 days supply | Qty: 20 | Fill #0

## 2017-06-05 MED FILL — ?SIMVASTATIN 10 MG TABS: 10 | 30 days supply | Qty: 30 | Fill #1

## 2017-06-05 NOTE — Telephone Encounter (Signed)
reglan was prescribed by Hannah(PA-C) on 05/08/17 oty:6 no refills.

## 2017-06-05 NOTE — Telephone Encounter (Signed)
Pt contacted the office requesting a refill of metoCLOPramide (REGLAN) 10 MG tablet. [facility]. Please f/u

## 2017-06-05 NOTE — Telephone Encounter (Signed)
Refilled

## 2017-06-06 NOTE — Telephone Encounter (Signed)
Patient was called and informed of medication being sent to onsite pharmacy. 

## 2017-06-12 MED FILL — ?OMEPRAZOLE DR 20MG CAPSULE: 20 | 30 days supply | Qty: 30 | Fill #1

## 2017-06-14 ENCOUNTER — Ambulatory Visit: Payer: Self-pay | Attending: Family Medicine | Admitting: Family Medicine

## 2017-06-14 ENCOUNTER — Encounter: Payer: Self-pay | Admitting: Family Medicine

## 2017-06-14 VITALS — BP 140/83 | HR 83 | Temp 98.7°F | Ht 64.0 in | Wt 195.4 lb

## 2017-06-14 DIAGNOSIS — Z79899 Other long term (current) drug therapy: Secondary | ICD-10-CM | POA: Insufficient documentation

## 2017-06-14 DIAGNOSIS — Z794 Long term (current) use of insulin: Secondary | ICD-10-CM | POA: Insufficient documentation

## 2017-06-14 DIAGNOSIS — K3184 Gastroparesis: Secondary | ICD-10-CM | POA: Insufficient documentation

## 2017-06-14 DIAGNOSIS — E785 Hyperlipidemia, unspecified: Secondary | ICD-10-CM | POA: Insufficient documentation

## 2017-06-14 DIAGNOSIS — E119 Type 2 diabetes mellitus without complications: Secondary | ICD-10-CM

## 2017-06-14 DIAGNOSIS — K279 Peptic ulcer, site unspecified, unspecified as acute or chronic, without hemorrhage or perforation: Secondary | ICD-10-CM | POA: Insufficient documentation

## 2017-06-14 DIAGNOSIS — I10 Essential (primary) hypertension: Secondary | ICD-10-CM | POA: Insufficient documentation

## 2017-06-14 DIAGNOSIS — E1143 Type 2 diabetes mellitus with diabetic autonomic (poly)neuropathy: Secondary | ICD-10-CM | POA: Insufficient documentation

## 2017-06-14 DIAGNOSIS — E876 Hypokalemia: Secondary | ICD-10-CM

## 2017-06-14 DIAGNOSIS — J45909 Unspecified asthma, uncomplicated: Secondary | ICD-10-CM | POA: Insufficient documentation

## 2017-06-14 LAB — GLUCOSE, POCT (MANUAL RESULT ENTRY): POC Glucose: 206 mg/dl — AB (ref 70–99)
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
## 2017-06-14 NOTE — Patient Instructions (Signed)

## 2017-06-15 ENCOUNTER — Other Ambulatory Visit: Payer: Self-pay | Admitting: Family Medicine

## 2017-06-15 LAB — BASIC METABOLIC PANEL
BUN/Creatinine Ratio: 11 (ref 9–23)
BUN: 11 mg/dL (ref 6–24)
CALCIUM: 9.1 mg/dL (ref 8.7–10.2)
CHLORIDE: 97 mmol/L (ref 96–106)
CO2: 26 mmol/L (ref 20–29)
Creatinine, Ser: 1.03 mg/dL — ABNORMAL HIGH (ref 0.57–1.00)
GFR calc Af Amer: 71 mL/min/{1.73_m2} (ref >59)
GFR calc non Af Amer: 61 mL/min/{1.73_m2} (ref >59)
GLUCOSE: 193 mg/dL — AB (ref 65–99)
POTASSIUM: 3.5 mmol/L (ref 3.5–5.2)
Sodium: 139 mmol/L (ref 134–144)

## 2017-06-15 MED ORDER — POTASSIUM CHLORIDE CRYS ER 20 MEQ PO TBCR: 20.0000 meq | EXTENDED_RELEASE_TABLET | Freq: Every day | ORAL | 3 refills | Status: DC

## 2017-06-15 MED FILL — POTASSIUM CL ER 20 MEQ TAB: 20 | 30 days supply | Qty: 30 | Fill #0

## 2017-06-23 MED FILL — !LANTUS 100 UNITS/ML VIAL: 100 | 28 days supply | Qty: 30 | Fill #2

## 2017-07-10 MED FILL — ?CARVEDILOL 12.5 MG TABLET: 12.5 | 30 days supply | Qty: 60 | Fill #1

## 2017-07-10 MED FILL — ?OMEPRAZOLE DR 20MG CAPSULE: 20 | 30 days supply | Qty: 30 | Fill #2

## 2017-07-10 MED FILL — ?SIMVASTATIN 10 MG TABS: 10 | 30 days supply | Qty: 30 | Fill #2

## 2017-07-11 MED FILL — ?HUMALOG 100 UNITS/ML VIAL: 100 | 27 days supply | Qty: 10 | Fill #2

## 2017-07-18 MED FILL — POTASSIUM CL ER 20 MEQ TAB: 20 | 30 days supply | Qty: 30 | Fill #1

## 2017-07-18 MED FILL — !LANTUS 100 UNITS/ML VIAL: 100 | 28 days supply | Qty: 30 | Fill #3

## 2017-07-18 MED FILL — GABAPENTIN 300 MG CAPSULE: 300 | 30 days supply | Qty: 60 | Fill #1

## 2017-07-31 MED FILL — ?GLIMEPIRIDE 4 MG TABLET: 4 | 30 days supply | Qty: 60 | Fill #2

## 2017-08-02 ENCOUNTER — Ambulatory Visit: Payer: Self-pay | Admitting: Family Medicine

## 2017-08-09 MED FILL — ?CARVEDILOL 12.5 MG TABLET: 12.5 | 30 days supply | Qty: 60 | Fill #2

## 2017-08-09 MED FILL — ?OMEPRAZOLE DR 20MG CAPSULE: 20 | 30 days supply | Qty: 30 | Fill #1

## 2017-08-09 MED FILL — BENAZEPRIL HCL 40 MG TABLET: 40 | 30 days supply | Qty: 30 | Fill #1

## 2017-08-10 ENCOUNTER — Ambulatory Visit: Payer: Self-pay | Attending: Family Medicine | Admitting: Family Medicine

## 2017-08-10 ENCOUNTER — Encounter: Payer: Self-pay | Admitting: Family Medicine

## 2017-08-10 VITALS — BP 154/88 | HR 79 | Temp 98.1°F | Ht 64.0 in | Wt 197.0 lb

## 2017-08-10 DIAGNOSIS — R252 Cramp and spasm: Secondary | ICD-10-CM | POA: Insufficient documentation

## 2017-08-10 DIAGNOSIS — Z79899 Other long term (current) drug therapy: Secondary | ICD-10-CM | POA: Insufficient documentation

## 2017-08-10 DIAGNOSIS — M25561 Pain in right knee: Secondary | ICD-10-CM | POA: Insufficient documentation

## 2017-08-10 DIAGNOSIS — G8929 Other chronic pain: Secondary | ICD-10-CM | POA: Insufficient documentation

## 2017-08-10 DIAGNOSIS — E1143 Type 2 diabetes mellitus with diabetic autonomic (poly)neuropathy: Secondary | ICD-10-CM | POA: Insufficient documentation

## 2017-08-10 DIAGNOSIS — I10 Essential (primary) hypertension: Secondary | ICD-10-CM | POA: Insufficient documentation

## 2017-08-10 DIAGNOSIS — Z886 Allergy status to analgesic agent status: Secondary | ICD-10-CM | POA: Insufficient documentation

## 2017-08-10 DIAGNOSIS — K257 Chronic gastric ulcer without hemorrhage or perforation: Secondary | ICD-10-CM | POA: Insufficient documentation

## 2017-08-10 DIAGNOSIS — K219 Gastro-esophageal reflux disease without esophagitis: Secondary | ICD-10-CM | POA: Insufficient documentation

## 2017-08-10 DIAGNOSIS — Z794 Long term (current) use of insulin: Secondary | ICD-10-CM | POA: Insufficient documentation

## 2017-08-10 DIAGNOSIS — E118 Type 2 diabetes mellitus with unspecified complications: Secondary | ICD-10-CM | POA: Diagnosis not present

## 2017-08-10 DIAGNOSIS — E7800 Pure hypercholesterolemia, unspecified: Secondary | ICD-10-CM | POA: Insufficient documentation

## 2017-08-10 DIAGNOSIS — J4520 Mild intermittent asthma, uncomplicated: Secondary | ICD-10-CM | POA: Insufficient documentation

## 2017-08-10 DIAGNOSIS — Z882 Allergy status to sulfonamides status: Secondary | ICD-10-CM | POA: Insufficient documentation

## 2017-08-10 DIAGNOSIS — E1165 Type 2 diabetes mellitus with hyperglycemia: Secondary | ICD-10-CM

## 2017-08-10 DIAGNOSIS — K3184 Gastroparesis: Secondary | ICD-10-CM | POA: Insufficient documentation

## 2017-08-10 DIAGNOSIS — E1149 Type 2 diabetes mellitus with other diabetic neurological complication: Secondary | ICD-10-CM

## 2017-08-10 LAB — GLUCOSE, POCT (MANUAL RESULT ENTRY): POC GLUCOSE: 165 mg/dL — AB (ref 70–99)

## 2017-08-10 LAB — POCT GLYCOSYLATED HEMOGLOBIN (HGB A1C): HBA1C, POC (CONTROLLED DIABETIC RANGE): 8 % — AB (ref 0.0–7.0)

## 2017-08-10 MED ORDER — INSULIN LISPRO 100 UNIT/ML ~~LOC~~ SOLN: 0.0000 [IU] | Freq: Three times a day (TID) | SUBCUTANEOUS | 11 refills | Status: DC

## 2017-08-10 MED ORDER — GABAPENTIN 300 MG PO CAPS: 600.0000 mg | ORAL_CAPSULE | Freq: Two times a day (BID) | ORAL | 3 refills | Status: DC

## 2017-08-10 MED ORDER — SIMVASTATIN 10 MG PO TABS: 10.0000 mg | ORAL_TABLET | Freq: Every day | ORAL | 0 refills | Status: DC

## 2017-08-10 MED ORDER — MOMETASONE FURO-FORMOTEROL FUM 100-5 MCG/ACT IN AERO: 2.0000 | INHALATION_SPRAY | Freq: Two times a day (BID) | RESPIRATORY_TRACT | 3 refills | Status: DC

## 2017-08-10 MED ORDER — POTASSIUM CHLORIDE CRYS ER 20 MEQ PO TBCR: 20.0000 meq | EXTENDED_RELEASE_TABLET | Freq: Every day | ORAL | 3 refills | Status: DC

## 2017-08-10 MED ORDER — IPRATROPIUM-ALBUTEROL 0.5-2.5 (3) MG/3ML IN SOLN: 3.0000 mL | Freq: Four times a day (QID) | RESPIRATORY_TRACT | 3 refills | Status: DC | PRN

## 2017-08-10 MED ORDER — INSULIN GLARGINE 100 UNIT/ML ~~LOC~~ SOLN: 55.0000 [IU] | Freq: Two times a day (BID) | SUBCUTANEOUS | 6 refills | Status: DC

## 2017-08-10 MED ORDER — CARVEDILOL 12.5 MG PO TABS: 12.5000 mg | ORAL_TABLET | Freq: Two times a day (BID) | ORAL | 1 refills | Status: DC

## 2017-08-10 MED ORDER — HYDROCHLOROTHIAZIDE 25 MG PO TABS: 25.0000 mg | ORAL_TABLET | Freq: Every day | ORAL | 1 refills | Status: DC

## 2017-08-10 MED ORDER — GLIMEPIRIDE 4 MG PO TABS: 4.0000 mg | ORAL_TABLET | Freq: Two times a day (BID) | ORAL | 1 refills | Status: DC

## 2017-08-10 MED ORDER — BENAZEPRIL HCL 40 MG PO TABS: 40.0000 mg | ORAL_TABLET | Freq: Every day | ORAL | 0 refills | Status: DC

## 2017-08-10 MED ORDER — OMEPRAZOLE 20 MG PO CPDR: 20.0000 mg | DELAYED_RELEASE_CAPSULE | Freq: Every day | ORAL | 1 refills | Status: DC

## 2017-08-10 MED FILL — HYDROCHLOROTHIAZIDE 25 MG T: 25 | 30 days supply | Qty: 30 | Fill #0

## 2017-08-10 MED FILL — ?HUMALOG 100 UNITS/ML VIAL: 100 | 27 days supply | Qty: 10 | Fill #3

## 2017-08-10 MED FILL — !DULERA 100 MCG/5 MCG INH: 100-5 | 30 days supply | Qty: 13 | Fill #1

## 2017-08-10 MED FILL — ?SIMVASTATIN 10 MG TABS: 10 | 90 days supply | Qty: 90 | Fill #0

## 2017-08-10 NOTE — Patient Instructions (Signed)
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
## 2017-08-11 LAB — CMP14+EGFR
ALBUMIN: 4.4 g/dL (ref 3.5–5.5)
ALT: 17 IU/L (ref 0–32)
AST: 18 IU/L (ref 0–40)
Albumin/Globulin Ratio: 1.8 (ref 1.2–2.2)
Alkaline Phosphatase: 68 IU/L (ref 39–117)
BUN / CREAT RATIO: 15 (ref 9–23)
BUN: 16 mg/dL (ref 6–24)
Bilirubin Total: 0.5 mg/dL (ref 0.0–1.2)
CALCIUM: 9.5 mg/dL (ref 8.7–10.2)
CHLORIDE: 98 mmol/L (ref 96–106)
CO2: 28 mmol/L (ref 20–29)
CREATININE: 1.04 mg/dL — AB (ref 0.57–1.00)
GFR, EST AFRICAN AMERICAN: 70 mL/min/{1.73_m2} (ref >59)
GFR, EST NON AFRICAN AMERICAN: 61 mL/min/{1.73_m2} (ref >59)
Globulin, Total: 2.5 g/dL (ref 1.5–4.5)
Glucose: 131 mg/dL — ABNORMAL HIGH (ref 65–99)
Potassium: 3.7 mmol/L (ref 3.5–5.2)
Sodium: 141 mmol/L (ref 134–144)
TOTAL PROTEIN: 6.9 g/dL (ref 6.0–8.5)

## 2017-08-11 LAB — LIPID PANEL
CHOL/HDL RATIO: 4.8 ratio — AB (ref 0.0–4.4)
Cholesterol, Total: 152 mg/dL (ref 100–199)
HDL: 32 mg/dL — AB (ref >39)
LDL CALC: 89 mg/dL (ref 0–99)
Triglycerides: 155 mg/dL — ABNORMAL HIGH (ref 0–149)
VLDL CHOLESTEROL CAL: 31 mg/dL (ref 5–40)

## 2017-08-11 LAB — MICROALBUMIN / CREATININE URINE RATIO
CREATININE, UR: 90.3 mg/dL
MICROALB/CREAT RATIO: 25.6 mg/g{creat} (ref 0.0–30.0)
MICROALBUM., U, RANDOM: 23.1 ug/mL

## 2017-08-14 MED FILL — POTASSIUM CL ER 20 MEQ TAB: 20 | 30 days supply | Qty: 30 | Fill #2

## 2017-08-15 MED FILL — $LANTUS 100 UNITS/ML VIAL: 100 | 27 days supply | Qty: 30 | Fill #0

## 2017-08-21 MED FILL — GABAPENTIN 300 MG CAPSULE: 300 | 30 days supply | Qty: 60 | Fill #2

## 2017-09-05 MED FILL — ?GLIMEPIRIDE 4 MG TABLET: 4 | 30 days supply | Qty: 60 | Fill #0

## 2017-09-11 MED FILL — $LANTUS 100 UNITS/ML VIAL: 100 | 27 days supply | Qty: 30 | Fill #1

## 2017-09-13 MED FILL — ?CARVEDILOL 12.5 MG TABLET: 12.5 | 30 days supply | Qty: 60 | Fill #0

## 2017-09-13 MED FILL — HYDROCHLOROTHIAZIDE 25 MG T: 25 | 30 days supply | Qty: 30 | Fill #1

## 2017-09-13 MED FILL — POTASSIUM CL ER 20 MEQ TAB: 20 | 30 days supply | Qty: 30 | Fill #3

## 2017-09-13 MED FILL — BENAZEPRIL HCL 40 MG TABLET: 40 | 30 days supply | Qty: 30 | Fill #2

## 2017-09-13 MED FILL — ?OMEPRazole 20mg CPDR: 20 | 30 days supply | Qty: 30 | Fill #2

## 2017-09-15 ENCOUNTER — Telehealth: Payer: Self-pay

## 2017-09-15 ENCOUNTER — Ambulatory Visit: Payer: Self-pay

## 2017-09-15 NOTE — Telephone Encounter (Signed)
Patient stopped by office to asked if she can have her Mirana checked. Is that something that you can do for her?

## 2017-09-19 MED FILL — GABAPENTIN 300 MG CAPSULE: 300 | 30 days supply | Qty: 120 | Fill #0

## 2017-09-20 ENCOUNTER — Ambulatory Visit: Payer: Self-pay | Attending: Family Medicine | Admitting: Family Medicine

## 2017-09-20 ENCOUNTER — Encounter: Payer: Self-pay | Admitting: Family Medicine

## 2017-09-20 VITALS — BP 156/92 | HR 76 | Temp 98.7°F | Ht 64.0 in | Wt 194.8 lb

## 2017-09-20 DIAGNOSIS — B350 Tinea barbae and tinea capitis: Secondary | ICD-10-CM | POA: Insufficient documentation

## 2017-09-20 DIAGNOSIS — E1165 Type 2 diabetes mellitus with hyperglycemia: Secondary | ICD-10-CM | POA: Insufficient documentation

## 2017-09-20 DIAGNOSIS — Z888 Allergy status to other drugs, medicaments and biological substances status: Secondary | ICD-10-CM | POA: Insufficient documentation

## 2017-09-20 DIAGNOSIS — J45909 Unspecified asthma, uncomplicated: Secondary | ICD-10-CM | POA: Insufficient documentation

## 2017-09-20 DIAGNOSIS — I10 Essential (primary) hypertension: Secondary | ICD-10-CM | POA: Insufficient documentation

## 2017-09-20 DIAGNOSIS — K279 Peptic ulcer, site unspecified, unspecified as acute or chronic, without hemorrhage or perforation: Secondary | ICD-10-CM | POA: Insufficient documentation

## 2017-09-20 DIAGNOSIS — K219 Gastro-esophageal reflux disease without esophagitis: Secondary | ICD-10-CM | POA: Insufficient documentation

## 2017-09-20 DIAGNOSIS — E1143 Type 2 diabetes mellitus with diabetic autonomic (poly)neuropathy: Secondary | ICD-10-CM | POA: Insufficient documentation

## 2017-09-20 DIAGNOSIS — Z794 Long term (current) use of insulin: Secondary | ICD-10-CM | POA: Insufficient documentation

## 2017-09-20 DIAGNOSIS — Z9889 Other specified postprocedural states: Secondary | ICD-10-CM | POA: Insufficient documentation

## 2017-09-20 DIAGNOSIS — Z30431 Encounter for routine checking of intrauterine contraceptive device: Secondary | ICD-10-CM | POA: Insufficient documentation

## 2017-09-20 DIAGNOSIS — Z886 Allergy status to analgesic agent status: Secondary | ICD-10-CM | POA: Insufficient documentation

## 2017-09-20 DIAGNOSIS — Z79899 Other long term (current) drug therapy: Secondary | ICD-10-CM | POA: Insufficient documentation

## 2017-09-20 DIAGNOSIS — Z79891 Long term (current) use of opiate analgesic: Secondary | ICD-10-CM | POA: Insufficient documentation

## 2017-09-20 DIAGNOSIS — K3184 Gastroparesis: Secondary | ICD-10-CM | POA: Insufficient documentation

## 2017-09-20 DIAGNOSIS — Z882 Allergy status to sulfonamides status: Secondary | ICD-10-CM | POA: Insufficient documentation

## 2017-09-20 MED ORDER — TERBINAFINE HCL 1 % EX CREA: 1.0000 "application " | TOPICAL_CREAM | Freq: Two times a day (BID) | CUTANEOUS | 1 refills | Status: DC
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
## 2017-09-20 NOTE — Patient Instructions (Signed)
Athlete's Foot Athlete's foot (tinea pedis) is a fungal infection of the skin on the feet. It often occurs on the skin that is between or underneath the toes. It can also occur on the soles of the feet. The infection can spread from person to person (is contagious). Follow these instructions at home:  Apply or take over-the-counter and prescription medicines only as told by your doctor.  Keep all follow-up visits as told by your doctor. This is important.  Do not scratch your feet.  Keep your feet dry: ? Wear cotton or wool socks. Change your socks every day or if they become wet. ? Wear shoes that allow air to move around, such as sandals or canvas tennis shoes.  Wash and dry your feet: ? Every day or as told by your doctor. ? After exercising. ? Including the area between your toes.  Wear sandals in wet areas, such as locker rooms and shared showers.  Do not share any of these items: ? Towels. ? Nail clippers. ? Other personal items that touch your feet.  If you have diabetes, keep your blood sugar under control. Contact a doctor if:  You have a fever.  You have swelling, soreness, warmth, or redness in your foot.  You are not getting better with treatment.  Your symptoms get worse.  You have new symptoms. This information is not intended to replace advice given to you by your health care provider. Make sure you discuss any questions you have with your health care provider. Document Released: 08/03/2007 Document Revised: 07/23/2015 Document Reviewed: 08/18/2014 Elsevier Interactive Patient Education  2018 [employer]  

## 2017-10-02 MED FILL — GLIMEPIRIDE 4 MG TABS: 4 | 30 days supply | Qty: 60 | Fill #1

## 2017-10-05 MED FILL — $LANTUS 100 UNITS/ML VIAL: 100 | 27 days supply | Qty: 30 | Fill #2

## 2017-10-16 MED FILL — ?OMEPRazole 20mg CPDR: 20 | 30 days supply | Qty: 30 | Fill #0

## 2017-10-16 MED FILL — ?CARVEDILOL 12.5 MG TABLET: 12.5 | 30 days supply | Qty: 60 | Fill #1

## 2017-10-16 MED FILL — BENAZEPRIL HCL 40 MG TABLET: 40 | 30 days supply | Qty: 30 | Fill #0

## 2017-10-16 MED FILL — POTASSIUM CL ER 20 MEQ TAB: 20 | 30 days supply | Qty: 30 | Fill #0

## 2017-10-18 MED FILL — HYDROCHLOROTHIAZIDE 25 MG T: 25 | 30 days supply | Qty: 30 | Fill #2

## 2017-10-31 MED FILL — $LANTUS 100 UNITS/ML VIAL: 100 | 27 days supply | Qty: 30 | Fill #3

## 2017-11-02 MED FILL — ?GLIMEPIRIDE 4 MG TABLET: 4 | 30 days supply | Qty: 60 | Fill #1

## 2017-11-13 ENCOUNTER — Encounter: Payer: Self-pay | Admitting: Family Medicine

## 2017-11-13 ENCOUNTER — Ambulatory Visit: Payer: Self-pay | Attending: Family Medicine | Admitting: Family Medicine

## 2017-11-13 VITALS — BP 135/85 | HR 70 | Temp 98.5°F | Ht 64.0 in | Wt 194.0 lb

## 2017-11-13 DIAGNOSIS — E118 Type 2 diabetes mellitus with unspecified complications: Secondary | ICD-10-CM

## 2017-11-13 DIAGNOSIS — I10 Essential (primary) hypertension: Secondary | ICD-10-CM

## 2017-11-13 DIAGNOSIS — Z7951 Long term (current) use of inhaled steroids: Secondary | ICD-10-CM | POA: Insufficient documentation

## 2017-11-13 DIAGNOSIS — Z794 Long term (current) use of insulin: Secondary | ICD-10-CM

## 2017-11-13 DIAGNOSIS — E1149 Type 2 diabetes mellitus with other diabetic neurological complication: Secondary | ICD-10-CM

## 2017-11-13 DIAGNOSIS — K3184 Gastroparesis: Secondary | ICD-10-CM | POA: Insufficient documentation

## 2017-11-13 DIAGNOSIS — Z23 Encounter for immunization: Secondary | ICD-10-CM

## 2017-11-13 DIAGNOSIS — G4700 Insomnia, unspecified: Secondary | ICD-10-CM | POA: Insufficient documentation

## 2017-11-13 DIAGNOSIS — Z79899 Other long term (current) drug therapy: Secondary | ICD-10-CM | POA: Insufficient documentation

## 2017-11-13 DIAGNOSIS — E1165 Type 2 diabetes mellitus with hyperglycemia: Secondary | ICD-10-CM

## 2017-11-13 DIAGNOSIS — G4709 Other insomnia: Secondary | ICD-10-CM

## 2017-11-13 DIAGNOSIS — E785 Hyperlipidemia, unspecified: Secondary | ICD-10-CM | POA: Insufficient documentation

## 2017-11-13 DIAGNOSIS — K257 Chronic gastric ulcer without hemorrhage or perforation: Secondary | ICD-10-CM

## 2017-11-13 DIAGNOSIS — E1143 Type 2 diabetes mellitus with diabetic autonomic (poly)neuropathy: Secondary | ICD-10-CM | POA: Insufficient documentation

## 2017-11-13 DIAGNOSIS — Z975 Presence of (intrauterine) contraceptive device: Secondary | ICD-10-CM | POA: Insufficient documentation

## 2017-11-13 DIAGNOSIS — E7800 Pure hypercholesterolemia, unspecified: Secondary | ICD-10-CM

## 2017-11-13 DIAGNOSIS — J4520 Mild intermittent asthma, uncomplicated: Secondary | ICD-10-CM

## 2017-11-13 LAB — POCT GLYCOSYLATED HEMOGLOBIN (HGB A1C): Hemoglobin A1C: 8 % — AB (ref 4.0–5.6)

## 2017-11-13 LAB — GLUCOSE, POCT (MANUAL RESULT ENTRY): POC Glucose: 124 mg/dl — AB (ref 70–99)

## 2017-11-13 MED ORDER — TRAZODONE HCL 100 MG PO TABS: 50.0000 mg | ORAL_TABLET | Freq: Every day | ORAL | 0 refills | Status: DC

## 2017-11-13 MED ORDER — BENAZEPRIL HCL 40 MG PO TABS: 40.0000 mg | ORAL_TABLET | Freq: Every day | ORAL | 1 refills | Status: DC

## 2017-11-13 MED ORDER — INSULIN GLARGINE 100 UNIT/ML ~~LOC~~ SOLN: 55.0000 [IU] | Freq: Two times a day (BID) | SUBCUTANEOUS | 6 refills | Status: DC

## 2017-11-13 MED ORDER — GABAPENTIN 300 MG PO CAPS: 600.0000 mg | ORAL_CAPSULE | Freq: Two times a day (BID) | ORAL | 1 refills | Status: DC

## 2017-11-13 MED ORDER — POTASSIUM CHLORIDE CRYS ER 20 MEQ PO TBCR: 20.0000 meq | EXTENDED_RELEASE_TABLET | Freq: Every day | ORAL | 6 refills | Status: DC

## 2017-11-13 MED ORDER — MOMETASONE FURO-FORMOTEROL FUM 100-5 MCG/ACT IN AERO: 2.0000 | INHALATION_SPRAY | Freq: Two times a day (BID) | RESPIRATORY_TRACT | 3 refills | Status: DC

## 2017-11-13 MED ORDER — CARVEDILOL 12.5 MG PO TABS: 12.5000 mg | ORAL_TABLET | Freq: Two times a day (BID) | ORAL | 1 refills | Status: DC

## 2017-11-13 MED ORDER — OMEPRAZOLE 20 MG PO CPDR: 20.0000 mg | DELAYED_RELEASE_CAPSULE | Freq: Every day | ORAL | 1 refills | Status: DC

## 2017-11-13 MED ORDER — GLIMEPIRIDE 4 MG PO TABS: 4.0000 mg | ORAL_TABLET | Freq: Two times a day (BID) | ORAL | 1 refills | Status: DC

## 2017-11-13 MED ORDER — SIMVASTATIN 10 MG PO TABS: 10.0000 mg | ORAL_TABLET | Freq: Every day | ORAL | 1 refills | Status: DC

## 2017-11-13 MED ORDER — INSULIN LISPRO 100 UNIT/ML ~~LOC~~ SOLN: 0.0000 [IU] | Freq: Three times a day (TID) | SUBCUTANEOUS | 11 refills | Status: DC

## 2017-11-13 MED ORDER — HYDROCHLOROTHIAZIDE 25 MG PO TABS: 25.0000 mg | ORAL_TABLET | Freq: Every day | ORAL | 1 refills | Status: DC

## 2017-11-13 MED FILL — ?HUMALOG 100 UNITS/ML VIAL: 100 | 27 days supply | Qty: 10 | Fill #0

## 2017-11-13 MED FILL — ?OMEPRazole 20mg CPDR: 20 | 30 days supply | Qty: 30 | Fill #0

## 2017-11-13 MED FILL — ?CARVEDILOL 12.5 MG TABLET: 12.5 | 30 days supply | Qty: 60 | Fill #0

## 2017-11-13 MED FILL — POTASSIUM CL ER 20 MEQ TAB: 20 | 30 days supply | Qty: 30 | Fill #0

## 2017-11-13 MED FILL — BENAZEPRIL HCL 40 MG TABLET: 40 | 30 days supply | Qty: 30 | Fill #0

## 2017-11-13 MED FILL — SIMVASTATIN 10 MG TABLET: 10 | 30 days supply | Qty: 30 | Fill #0

## 2017-11-13 NOTE — Patient Instructions (Signed)
Diabetes Mellitus and Nutrition When you have diabetes (diabetes mellitus), it is very important to have healthy eating habits because your blood sugar (glucose) levels are greatly affected by what you eat and drink. Eating healthy foods in the appropriate amounts, at about the same times every day, can help you:  Control your blood glucose.  Lower your risk of heart disease.  Improve your blood pressure.  Reach or maintain a healthy weight.  Every person with diabetes is different, and each person has different needs for a meal plan. Your health care provider may recommend that you work with a diet and nutrition specialist (dietitian) to make a meal plan that is best for you. Your meal plan may vary depending on factors such as:  The calories you need.  The medicines you take.  Your weight.  Your blood glucose, blood pressure, and cholesterol levels.  Your activity level.  Other health conditions you have, such as heart or kidney disease.  How do carbohydrates affect me? Carbohydrates affect your blood glucose level more than any other type of food. Eating carbohydrates naturally increases the amount of glucose in your blood. Carbohydrate counting is a method for keeping track of how many carbohydrates you eat. Counting carbohydrates is important to keep your blood glucose at a healthy level, especially if you use insulin or take certain oral diabetes medicines. It is important to know how many carbohydrates you can safely have in each meal. This is different for every person. Your dietitian can help you calculate how many carbohydrates you should have at each meal and for snack. Foods that contain carbohydrates include:  Bread, cereal, rice, pasta, and crackers.  Potatoes and corn.  Peas, beans, and lentils.  Milk and yogurt.  Fruit and juice.  Desserts, such as cakes, cookies, ice cream, and candy.  How does alcohol affect me? Alcohol can cause a sudden decrease in blood  glucose (hypoglycemia), especially if you use insulin or take certain oral diabetes medicines. Hypoglycemia can be a life-threatening condition. Symptoms of hypoglycemia (sleepiness, dizziness, and confusion) are similar to symptoms of having too much alcohol. If your health care provider says that alcohol is safe for you, follow these guidelines:  Limit alcohol intake to no more than 1 drink per day for nonpregnant women and 2 drinks per day for men. One drink equals 12 oz of beer, 5 oz of wine, or 1 oz of hard liquor.  Do not drink on an empty stomach.  Keep yourself hydrated with water, diet soda, or unsweetened iced tea.  Keep in mind that regular soda, juice, and other mixers may contain a lot of sugar and must be counted as carbohydrates.  What are tips for following this plan? Reading food labels  Start by checking the serving size on the label. The amount of calories, carbohydrates, fats, and other nutrients listed on the label are based on one serving of the food. Many foods contain more than one serving per package.  Check the total grams (g) of carbohydrates in one serving. You can calculate the number of servings of carbohydrates in one serving by dividing the total carbohydrates by 15. For example, if a food has 30 g of total carbohydrates, it would be equal to 2 servings of carbohydrates.  Check the number of grams (g) of saturated and trans fats in one serving. Choose foods that have low or no amount of these fats.  Check the number of milligrams (mg) of sodium in one serving. Most people   should limit total sodium intake to less than 2,300 mg per day.  Always check the nutrition information of foods labeled as "low-fat" or "nonfat". These foods may be higher in added sugar or refined carbohydrates and should be avoided.  Talk to your dietitian to identify your daily goals for nutrients listed on the label. Shopping  Avoid buying canned, premade, or processed foods. These  foods tend to be high in fat, sodium, and added sugar.  Shop around the outside edge of the grocery store. This includes fresh fruits and vegetables, bulk grains, fresh meats, and fresh dairy. Cooking  Use low-heat cooking methods, such as baking, instead of high-heat cooking methods like deep frying.  Cook using healthy oils, such as olive, canola, or sunflower oil.  Avoid cooking with butter, cream, or high-fat meats. Meal planning  Eat meals and snacks regularly, preferably at the same times every day. Avoid going long periods of time without eating.  Eat foods high in fiber, such as fresh fruits, vegetables, beans, and whole grains. Talk to your dietitian about how many servings of carbohydrates you can eat at each meal.  Eat 4-6 ounces of lean protein each day, such as lean meat, chicken, fish, eggs, or tofu. 1 ounce is equal to 1 ounce of meat, chicken, or fish, 1 egg, or 1/4 cup of tofu.  Eat some foods each day that contain healthy fats, such as avocado, nuts, seeds, and fish. Lifestyle   Check your blood glucose regularly.  Exercise at least 30 minutes 5 or more days each week, or as told by your health care provider.  Take medicines as told by your health care provider.  Do not use any products that contain nicotine or tobacco, such as cigarettes and e-cigarettes. If you need help quitting, ask your health care provider.  Work with a counselor or diabetes educator to identify strategies to manage stress and any emotional and social challenges. What are some questions to ask my health care provider?  Do I need to meet with a diabetes educator?  Do I need to meet with a dietitian?  What number can I call if I have questions?  When are the best times to check my blood glucose? Where to find more information:  American Diabetes Association: diabetes.org/food-and-fitness/food  Academy of Nutrition and Dietetics:  www.eatright.org/resources/health/diseases-and-conditions/diabetes  National Institute of Diabetes and Digestive and Kidney Diseases (NIH): www.niddk.nih.gov/health-information/diabetes/overview/diet-eating-physical-activity Summary  A healthy meal plan will help you control your blood glucose and maintain a healthy lifestyle.  Working with a diet and nutrition specialist (dietitian) can help you make a meal plan that is best for you.  Keep in mind that carbohydrates and alcohol have immediate effects on your blood glucose levels. It is important to count carbohydrates and to use alcohol carefully. This information is not intended to replace advice given to you by your health care provider. Make sure you discuss any questions you have with your health care provider. Document Released: 11/11/2004 Document Revised: 03/21/2016 Document Reviewed: 03/21/2016 Elsevier Interactive Patient Education  2018 [employer]  
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
## 2017-11-28 MED FILL — $LANTUS 100 UNITS/ML VIAL: 100 | 27 days supply | Qty: 30 | Fill #4

## 2017-12-04 MED FILL — ?GLIMEPIRIDE 4 MG TABLET: 4 | 30 days supply | Qty: 60 | Fill #2

## 2017-12-13 MED FILL — ?OMEPRazole 20mg CPDR: 20 | 30 days supply | Qty: 30 | Fill #1

## 2017-12-13 MED FILL — POTASSIUM CL ER 20 MEQ TABL: 20 | 30 days supply | Qty: 30 | Fill #1

## 2017-12-13 MED FILL — ?HUMALOG 100 UNITS/ML VIAL: 100 | 27 days supply | Qty: 10 | Fill #1

## 2017-12-13 MED FILL — HYDROCHLOROTHIAZIDE 25 MG T: 25 | 30 days supply | Qty: 30 | Fill #3

## 2017-12-13 MED FILL — BENAZEPRIL HCL 40 MG TABLET: 40 | 30 days supply | Qty: 30 | Fill #1

## 2017-12-13 MED FILL — ?CARVEDILOL 12.5 MG TABLET: 12.5 | 30 days supply | Qty: 60 | Fill #1

## 2017-12-14 MED FILL — SIMVASTATIN 10 MG TABLET: 10 | 30 days supply | Qty: 30 | Fill #1

## 2017-12-25 MED FILL — $LANTUS 100 UNITS/ML VIAL: 100 | 27 days supply | Qty: 30 | Fill #5

## 2018-01-01 MED FILL — ?GLIMEPIRIDE 4 MG TABLET: 4 | 30 days supply | Qty: 60 | Fill #3

## 2018-01-01 MED FILL — GABAPENTIN 300 MG CAPSULE: 300 | 30 days supply | Qty: 120 | Fill #1

## 2018-01-09 ENCOUNTER — Ambulatory Visit: Payer: Self-pay | Attending: Critical Care Medicine | Admitting: Critical Care Medicine

## 2018-01-09 ENCOUNTER — Encounter: Payer: Self-pay | Admitting: Critical Care Medicine

## 2018-01-09 VITALS — BP 131/80 | HR 82 | Temp 98.1°F | Wt 198.0 lb

## 2018-01-09 DIAGNOSIS — M62830 Muscle spasm of back: Secondary | ICD-10-CM | POA: Insufficient documentation

## 2018-01-09 DIAGNOSIS — I10 Essential (primary) hypertension: Secondary | ICD-10-CM | POA: Insufficient documentation

## 2018-01-09 DIAGNOSIS — Z794 Long term (current) use of insulin: Secondary | ICD-10-CM | POA: Insufficient documentation

## 2018-01-09 DIAGNOSIS — E1143 Type 2 diabetes mellitus with diabetic autonomic (poly)neuropathy: Secondary | ICD-10-CM | POA: Insufficient documentation

## 2018-01-09 DIAGNOSIS — M546 Pain in thoracic spine: Secondary | ICD-10-CM | POA: Insufficient documentation

## 2018-01-09 DIAGNOSIS — J45909 Unspecified asthma, uncomplicated: Secondary | ICD-10-CM | POA: Insufficient documentation

## 2018-01-09 DIAGNOSIS — Z8711 Personal history of peptic ulcer disease: Secondary | ICD-10-CM | POA: Insufficient documentation

## 2018-01-09 DIAGNOSIS — R252 Cramp and spasm: Secondary | ICD-10-CM | POA: Insufficient documentation

## 2018-01-09 DIAGNOSIS — M25531 Pain in right wrist: Secondary | ICD-10-CM | POA: Insufficient documentation

## 2018-01-09 DIAGNOSIS — K3184 Gastroparesis: Secondary | ICD-10-CM | POA: Insufficient documentation

## 2018-01-09 DIAGNOSIS — E1142 Type 2 diabetes mellitus with diabetic polyneuropathy: Secondary | ICD-10-CM | POA: Insufficient documentation

## 2018-01-09 DIAGNOSIS — Z760 Encounter for issue of repeat prescription: Secondary | ICD-10-CM | POA: Insufficient documentation

## 2018-01-09 DIAGNOSIS — M25532 Pain in left wrist: Secondary | ICD-10-CM | POA: Insufficient documentation

## 2018-01-09 MED ORDER — CYCLOBENZAPRINE HCL 10 MG PO TABS: 10.0000 mg | ORAL_TABLET | Freq: Two times a day (BID) | ORAL | 2 refills | Status: DC | PRN

## 2018-01-09 MED FILL — OMEPRAZOLE 20 MG CAP: 20 | 30 days supply | Qty: 30 | Fill #2

## 2018-01-09 MED FILL — ?HUMALOG 100 UNITS/ML VIAL: 100 | 27 days supply | Qty: 10 | Fill #2

## 2018-01-09 MED FILL — ?CARVEDILOL 12.5 MG TABLET: 12.5 | 30 days supply | Qty: 60 | Fill #2

## 2018-01-09 MED FILL — CYCLOBENZAPRINE 10 MG TAB: 10 | 30 days supply | Qty: 60 | Fill #0

## 2018-01-09 NOTE — Assessment & Plan Note (Signed)
History of bilateral wrist pain with muscle spasticity and associated low back pain Plan We will refill cyclobenzaprine to take as needed

## 2018-01-09 NOTE — Assessment & Plan Note (Signed)
Diabetic gastroparesis stable at this time

## 2018-01-09 NOTE — Progress Notes (Signed)
   Subjective:    Patient ID: Kelly Santana, female    DOB: [birth year], 57 y.o.   MRN: [id number]  57 y.o.F here for med refill of flexeril for muscle spasms. Hx of DM2, HTN, PUD, DM complications, also asthma  Hx of back muscle spasm, and knee issues and also wrist pain x 2 weeks.  Uncomfortable to sleep at night.    DM2:  CBGs 170 this am.       Review of Systems  Constitutional: Positive for fatigue. Negative for activity change and diaphoresis.  HENT: Negative.   Eyes: Negative.   Respiratory: Negative.   Genitourinary: Negative.   Musculoskeletal: Positive for back pain and myalgias.       Wrist pain, foot pain   Skin: Negative.   Neurological: Negative for weakness, numbness and headaches.  Hematological: Negative.   Psychiatric/Behavioral: Negative.        Objective:   Physical Exam Vitals:   01/09/18 1426  BP: 131/80  Pulse: 82  Temp: 98.1 F (36.7 C)  SpO2: 96%  Weight: 198 lb (89.8 kg)    Gen: Pleasant, obese, in no distress,  normal affect  ENT: No lesions,  mouth clear,  oropharynx clear, no postnasal drip  Neck: No JVD, no TMG, no carotid bruits  Lungs: No use of accessory muscles, no dullness to percussion, clear without rales or rhonchi  Cardiovascular: RRR, heart sounds normal, no murmur or gallops, no peripheral edema  Abdomen: soft and NT, no HSM,  BS normal  Musculoskeletal: No deformities, no cyanosis or clubbing, tenderness laterally in both wrists with full range of motion.  Lower back is nontender spine in good alignment  Neuro: alert, non focal  Skin: Warm, no lesions or rashes  No results found.       Assessment & Plan:  I personally reviewed all images and lab data in the [facility] system as well as any outside material available during this office visit and agree with the  radiology impressions.   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  Diagnoses and all orders for this visit:  Muscle cramps  Acute left-sided thoracic back pain -     cyclobenzaprine (FLEXERIL) 10 MG tablet; Take 1 tablet (10 mg total) by mouth 2 (two) times daily as needed for muscle spasms.  Bilateral wrist pain  Type 2 diabetes mellitus with diabetic polyneuropathy, with long-term current use of insulin (HCC)  Gastroparesis due to DM (HCC)

## 2018-01-09 NOTE — Progress Notes (Signed)
Medication RF on Muscle relaxer: Cyclobenzaprine  Pain in wrists due to carpal tunnel Pain radiated in arms on last week  Nerve pain in feet is shooting up leg but states will discuss with PCP next  week.

## 2018-01-09 NOTE — Patient Instructions (Signed)
A refill on Flexeril was obtained Please keep your next scheduled primary care visit

## 2018-01-09 NOTE — Assessment & Plan Note (Signed)
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

## 2018-01-10 NOTE — Assessment & Plan Note (Signed)
Chronic low back muscle spasticity and bilateral wrist pain Previously improved with Flexeril Plan Will refill Flexeril to take 10 mg 2 times daily as needed

## 2018-01-12 MED FILL — POTASSIUM CL ER 20 MEQ TAB: 20 | 30 days supply | Qty: 30 | Fill #2

## 2018-01-12 MED FILL — BENAZEPRIL HCL 40 MG TABLET: 40 | 30 days supply | Qty: 30 | Fill #2

## 2018-01-12 MED FILL — SIMVASTATIN 10 MG TABLET: 10 | 30 days supply | Qty: 30 | Fill #2

## 2018-01-22 MED FILL — $LANTUS 100 UNITS/ML VIAL: 100 | 27 days supply | Qty: 30 | Fill #6

## 2018-01-23 ENCOUNTER — Other Ambulatory Visit: Payer: Self-pay | Admitting: Family Medicine

## 2018-01-23 MED FILL — HYDROCHLOROTHIAZIDE 25 MG T: 25 | 30 days supply | Qty: 30 | Fill #4

## 2018-01-23 MED FILL — ?METOCLOPRAMIDE 10MG TABLET: 10 | 5 days supply | Qty: 20 | Fill #0

## 2018-01-29 MED FILL — traZODone HCL 100 MG TABS: 100 | 30 days supply | Qty: 15 | Fill #0

## 2018-01-29 MED FILL — hydrOXYzine HCL 25 MG TABS: 25 | 30 days supply | Qty: 90 | Fill #0

## 2018-01-30 MED FILL — ?GLIMEPIRIDE 4 MG TABLET: 4 | 30 days supply | Qty: 60 | Fill #4

## 2018-02-07 MED FILL — ?HUMALOG 100 UNITS/ML VIAL: 100 | 27 days supply | Qty: 10 | Fill #3

## 2018-02-08 MED FILL — SIMVASTATIN 10 MG TABLET: 10 | 30 days supply | Qty: 30 | Fill #3

## 2018-02-08 MED FILL — BENAZEPRIL HCL 40 MG TABLET: 40 | 30 days supply | Qty: 30 | Fill #3

## 2018-02-08 MED FILL — ?CARVEDILOL 12.5 MG TABLET: 12.5 | 30 days supply | Qty: 60 | Fill #3

## 2018-02-08 MED FILL — OMEPRAZOLE 20 MG CAP: 20 | 30 days supply | Qty: 30 | Fill #3

## 2018-02-12 ENCOUNTER — Ambulatory Visit: Payer: Self-pay | Admitting: Family Medicine

## 2018-02-13 ENCOUNTER — Ambulatory Visit: Payer: Self-pay | Attending: Family Medicine | Admitting: Family Medicine

## 2018-02-13 ENCOUNTER — Encounter: Payer: Self-pay | Admitting: Family Medicine

## 2018-02-13 VITALS — BP 150/92 | HR 81 | Temp 97.6°F | Ht 64.0 in | Wt 195.6 lb

## 2018-02-13 DIAGNOSIS — K3184 Gastroparesis: Secondary | ICD-10-CM | POA: Insufficient documentation

## 2018-02-13 DIAGNOSIS — G4700 Insomnia, unspecified: Secondary | ICD-10-CM | POA: Insufficient documentation

## 2018-02-13 DIAGNOSIS — G4709 Other insomnia: Secondary | ICD-10-CM

## 2018-02-13 DIAGNOSIS — I10 Essential (primary) hypertension: Secondary | ICD-10-CM | POA: Insufficient documentation

## 2018-02-13 DIAGNOSIS — Z79899 Other long term (current) drug therapy: Secondary | ICD-10-CM | POA: Insufficient documentation

## 2018-02-13 DIAGNOSIS — Z794 Long term (current) use of insulin: Secondary | ICD-10-CM | POA: Insufficient documentation

## 2018-02-13 DIAGNOSIS — Z888 Allergy status to other drugs, medicaments and biological substances status: Secondary | ICD-10-CM | POA: Insufficient documentation

## 2018-02-13 DIAGNOSIS — Z882 Allergy status to sulfonamides status: Secondary | ICD-10-CM | POA: Insufficient documentation

## 2018-02-13 DIAGNOSIS — E1142 Type 2 diabetes mellitus with diabetic polyneuropathy: Secondary | ICD-10-CM | POA: Insufficient documentation

## 2018-02-13 DIAGNOSIS — E118 Type 2 diabetes mellitus with unspecified complications: Secondary | ICD-10-CM

## 2018-02-13 DIAGNOSIS — E1143 Type 2 diabetes mellitus with diabetic autonomic (poly)neuropathy: Secondary | ICD-10-CM | POA: Insufficient documentation

## 2018-02-13 DIAGNOSIS — Z886 Allergy status to analgesic agent status: Secondary | ICD-10-CM | POA: Insufficient documentation

## 2018-02-13 DIAGNOSIS — Z1239 Encounter for other screening for malignant neoplasm of breast: Secondary | ICD-10-CM

## 2018-02-13 DIAGNOSIS — J45909 Unspecified asthma, uncomplicated: Secondary | ICD-10-CM | POA: Insufficient documentation

## 2018-02-13 DIAGNOSIS — K219 Gastro-esophageal reflux disease without esophagitis: Secondary | ICD-10-CM | POA: Insufficient documentation

## 2018-02-13 LAB — POCT GLYCOSYLATED HEMOGLOBIN (HGB A1C): Hemoglobin A1C: 8.3 % — AB (ref 4.0–5.6)

## 2018-02-13 LAB — GLUCOSE, POCT (MANUAL RESULT ENTRY): POC GLUCOSE: 186 mg/dL — AB (ref 70–99)

## 2018-02-13 MED ORDER — TRAZODONE HCL 100 MG PO TABS: 50.0000 mg | ORAL_TABLET | Freq: Every day | ORAL | 1 refills | Status: DC

## 2018-02-13 MED ORDER — INSULIN GLARGINE 100 UNIT/ML ~~LOC~~ SOLN: 63.0000 [IU] | Freq: Two times a day (BID) | SUBCUTANEOUS | 6 refills | Status: DC

## 2018-02-13 MED FILL — $LANTUS 100 UNITS/ML VIAL: 100 | 23 days supply | Qty: 30 | Fill #0

## 2018-02-13 MED FILL — POTASSIUM CL ER 20 MEQ TAB: 20 | 30 days supply | Qty: 30 | Fill #3

## 2018-02-13 MED FILL — HYDROCHLOROTHIAZIDE 25 MG T: 25 | 30 days supply | Qty: 30 | Fill #5
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
## 2018-02-14 LAB — BASIC METABOLIC PANEL
BUN/Creatinine Ratio: 12 (ref 9–23)
BUN: 12 mg/dL (ref 6–24)
CHLORIDE: 98 mmol/L (ref 96–106)
CO2: 28 mmol/L (ref 20–29)
Calcium: 9.3 mg/dL (ref 8.7–10.2)
Creatinine, Ser: 1.03 mg/dL — ABNORMAL HIGH (ref 0.57–1.00)
GFR calc Af Amer: 70 mL/min/{1.73_m2} (ref >59)
GFR calc non Af Amer: 61 mL/min/{1.73_m2} (ref >59)
Glucose: 164 mg/dL — ABNORMAL HIGH (ref 65–99)
POTASSIUM: 3.7 mmol/L (ref 3.5–5.2)
Sodium: 141 mmol/L (ref 134–144)

## 2018-02-15 ENCOUNTER — Telehealth: Payer: Self-pay | Admitting: Family Medicine

## 2018-02-15 NOTE — Telephone Encounter (Signed)
Patient came in to notify that she was not able to stay off of her omeprazole. Patient states she would like to get a refill if possible.   Please follow up.

## 2018-02-15 NOTE — Telephone Encounter (Signed)
Will route to PCP for review. 

## 2018-03-08 MED FILL — ?GLIMEPIRIDE 4 MG TABLET: 4 | 30 days supply | Qty: 60 | Fill #5

## 2018-03-09 MED FILL — BENAZEPRIL HCL 40 MG TABLET: 40 | 30 days supply | Qty: 30 | Fill #4

## 2018-03-09 MED FILL — ?HUMALOG 100 UNITS/ML VIAL: 100 | 27 days supply | Qty: 10 | Fill #4

## 2018-03-09 MED FILL — ?OMEPRAZOLE 20 MG CAPSULE D: 20 | 30 days supply | Qty: 30 | Fill #4

## 2018-03-09 MED FILL — ?CARVEDILOL 12.5 MG TABLET: 12.5 | 30 days supply | Qty: 60 | Fill #4

## 2018-03-15 MED FILL — GABAPENTIN 300 MG CAPSULE: 300 | 30 days supply | Qty: 120 | Fill #2

## 2018-03-15 MED FILL — SIMVASTATIN 10 MG TABLET: 10 | 30 days supply | Qty: 30 | Fill #4

## 2018-03-15 MED FILL — $LANTUS 100 UNITS/ML VIAL: 100 | 95 days supply | Qty: 120 | Fill #1

## 2018-03-15 MED FILL — HYDROCHLOROTHIAZIDE 25 MG T: 25 | 30 days supply | Qty: 30 | Fill #0

## 2018-03-15 MED FILL — POTASSIUM CL ER 20 MEQ TAB: 20 | 30 days supply | Qty: 30 | Fill #4

## 2018-03-21 ENCOUNTER — Telehealth: Payer: Self-pay | Admitting: Family Medicine

## 2018-03-21 NOTE — Telephone Encounter (Signed)
Patient believes she has the flu and would like to speak to the triage nurse before being seen.   Please follow up.

## 2018-03-23 NOTE — Telephone Encounter (Signed)
Pt states she is feeling better. She had body aches and nausea. She took cold medication and tylenol  And was able to get rest. Her blood sugar was 102 last night. She did not take them today. Advised to keep level between 80 - 130, good hand hygiene, and wipe area. She was advised if not better on Monday, she could call the office for an appointment. Pt verbalized understanding.

## 2018-04-02 MED FILL — ?HUMALOG 100 UNITS/ML VIAL: 100 | 27 days supply | Qty: 10 | Fill #5

## 2018-04-06 MED FILL — GLIMEPIRIDE 4 MG TABS: 4 | 30 days supply | Qty: 60 | Fill #0

## 2018-04-16 MED FILL — ?CARVEDILOL 12.5 MG TABLET: 12.5 | 30 days supply | Qty: 60 | Fill #5

## 2018-04-16 MED FILL — ?OMEPRAZOLE 20 MG CAPSULE D: 20 | 30 days supply | Qty: 30 | Fill #5

## 2018-04-16 MED FILL — ?SIMVASTATIN 10MG TABLE: 10 | 30 days supply | Qty: 30 | Fill #5

## 2018-04-16 MED FILL — BENAZEPRIL HCL 40 MG TABLET: 40 | 30 days supply | Qty: 30 | Fill #5

## 2018-04-16 MED FILL — HYDROCHLOROTHIAZIDE 25 MG T: 25 | 30 days supply | Qty: 30 | Fill #1

## 2018-04-16 MED FILL — POTASSIUM CL ER 20 MEQ TAB: 20 | 30 days supply | Qty: 30 | Fill #5

## 2018-05-08 ENCOUNTER — Other Ambulatory Visit: Payer: Self-pay | Admitting: Family Medicine

## 2018-05-08 DIAGNOSIS — K257 Chronic gastric ulcer without hemorrhage or perforation: Secondary | ICD-10-CM

## 2018-05-08 DIAGNOSIS — I10 Essential (primary) hypertension: Secondary | ICD-10-CM

## 2018-05-08 MED FILL — GLIMEPIRIDE 4 MG TABS: 4 | 30 days supply | Qty: 60 | Fill #1

## 2018-05-11 MED FILL — POTASSIUM CL ER 20 MEQ TAB: 20 | 30 days supply | Qty: 30 | Fill #6

## 2018-05-11 MED FILL — HYDROCHLOROTHIAZIDE 25 MG T: 25 | 90 days supply | Qty: 90 | Fill #2

## 2018-05-11 MED FILL — ?CARVEDILOL 12.5 MG TABLET: 12.5 | 90 days supply | Qty: 180 | Fill #0

## 2018-05-11 MED FILL — ?OMEPRAZOLE 20 MG CAPSULE D: 20 | 90 days supply | Qty: 90 | Fill #0

## 2018-05-11 MED FILL — BENAZEPRIL HCL 40 MG TABLET: 40 | 90 days supply | Qty: 90 | Fill #0

## 2018-05-14 ENCOUNTER — Other Ambulatory Visit: Payer: Self-pay | Admitting: Family Medicine

## 2018-05-14 DIAGNOSIS — E7800 Pure hypercholesterolemia, unspecified: Secondary | ICD-10-CM

## 2018-05-14 MED ORDER — SIMVASTATIN 10 MG PO TABS: 10.0000 mg | ORAL_TABLET | Freq: Every day | ORAL | 1 refills | Status: DC

## 2018-05-21 ENCOUNTER — Ambulatory Visit: Payer: Self-pay | Admitting: Family Medicine

## 2018-05-24 ENCOUNTER — Encounter: Payer: Self-pay | Admitting: Family Medicine

## 2018-05-24 ENCOUNTER — Other Ambulatory Visit: Payer: Self-pay

## 2018-05-24 ENCOUNTER — Ambulatory Visit: Payer: BLUE CROSS/BLUE SHIELD | Attending: Family Medicine | Admitting: Family Medicine

## 2018-05-24 DIAGNOSIS — E876 Hypokalemia: Secondary | ICD-10-CM

## 2018-05-24 DIAGNOSIS — E1142 Type 2 diabetes mellitus with diabetic polyneuropathy: Secondary | ICD-10-CM

## 2018-05-24 DIAGNOSIS — I10 Essential (primary) hypertension: Secondary | ICD-10-CM | POA: Diagnosis not present

## 2018-05-24 DIAGNOSIS — R21 Rash and other nonspecific skin eruption: Secondary | ICD-10-CM

## 2018-05-24 DIAGNOSIS — Z794 Long term (current) use of insulin: Secondary | ICD-10-CM

## 2018-05-24 MED ORDER — POTASSIUM CHLORIDE CRYS ER 20 MEQ PO TBCR: 20.0000 meq | EXTENDED_RELEASE_TABLET | Freq: Every day | ORAL | 1 refills | Status: DC

## 2018-05-24 MED ORDER — CLOTRIMAZOLE-BETAMETHASONE 1-0.05 % EX LOTN: TOPICAL_LOTION | Freq: Two times a day (BID) | CUTANEOUS | 1 refills | Status: DC
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
## 2018-05-26 ENCOUNTER — Encounter: Payer: Self-pay | Admitting: Family Medicine

## 2018-05-28 ENCOUNTER — Other Ambulatory Visit: Payer: Self-pay | Admitting: Family Medicine

## 2018-05-28 ENCOUNTER — Encounter: Payer: Self-pay | Admitting: Family Medicine

## 2018-05-28 DIAGNOSIS — G4709 Other insomnia: Secondary | ICD-10-CM

## 2018-05-28 DIAGNOSIS — I10 Essential (primary) hypertension: Secondary | ICD-10-CM

## 2018-05-28 DIAGNOSIS — E1149 Type 2 diabetes mellitus with other diabetic neurological complication: Secondary | ICD-10-CM

## 2018-05-28 DIAGNOSIS — E118 Type 2 diabetes mellitus with unspecified complications: Secondary | ICD-10-CM

## 2018-05-28 DIAGNOSIS — Z794 Long term (current) use of insulin: Secondary | ICD-10-CM

## 2018-05-28 DIAGNOSIS — E7800 Pure hypercholesterolemia, unspecified: Secondary | ICD-10-CM

## 2018-05-28 DIAGNOSIS — E876 Hypokalemia: Secondary | ICD-10-CM

## 2018-05-28 DIAGNOSIS — F411 Generalized anxiety disorder: Secondary | ICD-10-CM

## 2018-05-28 DIAGNOSIS — J4520 Mild intermittent asthma, uncomplicated: Secondary | ICD-10-CM

## 2018-05-28 DIAGNOSIS — R21 Rash and other nonspecific skin eruption: Secondary | ICD-10-CM

## 2018-05-28 DIAGNOSIS — K257 Chronic gastric ulcer without hemorrhage or perforation: Secondary | ICD-10-CM

## 2018-05-28 MED ORDER — POTASSIUM CHLORIDE CRYS ER 20 MEQ PO TBCR: 20.0000 meq | EXTENDED_RELEASE_TABLET | Freq: Every day | ORAL | 1 refills | Status: DC

## 2018-05-28 MED ORDER — GLIMEPIRIDE 4 MG PO TABS: 4.0000 mg | ORAL_TABLET | Freq: Two times a day (BID) | ORAL | 1 refills | Status: DC

## 2018-05-28 MED ORDER — TRAZODONE HCL 100 MG PO TABS: 50.0000 mg | ORAL_TABLET | Freq: Every day | ORAL | 1 refills | Status: DC

## 2018-05-28 MED ORDER — CLOTRIMAZOLE-BETAMETHASONE 1-0.05 % EX LOTN: TOPICAL_LOTION | Freq: Two times a day (BID) | CUTANEOUS | 1 refills | Status: DC

## 2018-05-28 MED ORDER — INSULIN GLARGINE 100 UNIT/ML ~~LOC~~ SOLN: 63.0000 [IU] | Freq: Two times a day (BID) | SUBCUTANEOUS | 6 refills | Status: DC

## 2018-05-28 MED ORDER — IPRATROPIUM-ALBUTEROL 0.5-2.5 (3) MG/3ML IN SOLN: 3.0000 mL | Freq: Four times a day (QID) | RESPIRATORY_TRACT | 3 refills | Status: DC | PRN

## 2018-05-28 MED ORDER — CARVEDILOL 12.5 MG PO TABS: ORAL_TABLET | ORAL | 0 refills | Status: DC

## 2018-05-28 MED ORDER — METOCLOPRAMIDE HCL 10 MG PO TABS: ORAL_TABLET | ORAL | 0 refills | Status: DC

## 2018-05-28 MED ORDER — BENAZEPRIL HCL 40 MG PO TABS: 40.0000 mg | ORAL_TABLET | Freq: Every day | ORAL | 1 refills | Status: DC

## 2018-05-28 MED ORDER — OMEPRAZOLE 20 MG PO CPDR: 20.0000 mg | DELAYED_RELEASE_CAPSULE | Freq: Every day | ORAL | 1 refills | Status: DC

## 2018-05-28 MED ORDER — MOMETASONE FURO-FORMOTEROL FUM 100-5 MCG/ACT IN AERO: 2.0000 | INHALATION_SPRAY | Freq: Two times a day (BID) | RESPIRATORY_TRACT | 3 refills | Status: DC

## 2018-05-28 MED ORDER — FLUTICASONE PROPIONATE 50 MCG/ACT NA SUSP: 2.0000 | Freq: Every day | NASAL | 6 refills | Status: DC

## 2018-05-28 MED ORDER — SIMVASTATIN 10 MG PO TABS: 10.0000 mg | ORAL_TABLET | Freq: Every day | ORAL | 1 refills | Status: DC

## 2018-05-28 MED ORDER — GABAPENTIN 300 MG PO CAPS: 600.0000 mg | ORAL_CAPSULE | Freq: Two times a day (BID) | ORAL | 1 refills | Status: DC

## 2018-05-28 MED ORDER — HYDROXYZINE HCL 25 MG PO TABS: 25.0000 mg | ORAL_TABLET | Freq: Three times a day (TID) | ORAL | 1 refills | Status: DC | PRN

## 2018-05-28 MED ORDER — CARVEDILOL 12.5 MG PO TABS: ORAL_TABLET | ORAL | 1 refills | Status: DC

## 2018-05-28 MED ORDER — HYDROCHLOROTHIAZIDE 25 MG PO TABS: 25.0000 mg | ORAL_TABLET | Freq: Every day | ORAL | 1 refills | Status: DC

## 2018-05-28 MED ORDER — HYDROXYZINE HCL 25 MG PO TABS: 25.0000 mg | ORAL_TABLET | Freq: Three times a day (TID) | ORAL | 0 refills | Status: DC | PRN

## 2018-05-28 MED ORDER — INSULIN LISPRO 100 UNIT/ML ~~LOC~~ SOLN: 0.0000 [IU] | Freq: Three times a day (TID) | SUBCUTANEOUS | 6 refills | Status: DC

## 2018-06-06 MED FILL — GLIMEPIRIDE 4 MG TABS: 4 | 30 days supply | Qty: 60 | Fill #2

## 2018-06-06 MED FILL — POTASSIUM CL ER 20 MEQ TAB: 20 | 30 days supply | Qty: 30 | Fill #0

## 2018-06-20 MED FILL — $LANTUS 100 UNITS/ML VIAL: 100 | 47 days supply | Qty: 60 | Fill #2

## 2018-07-07 MED FILL — GLIMEPIRIDE 4 MG TABS: 4 | 30 days supply | Qty: 60 | Fill #3

## 2018-07-10 MED FILL — POTASSIUM CL ER 20 MEQ TAB: 20 | 30 days supply | Qty: 30 | Fill #1

## 2018-07-17 MED FILL — ?OMEPRAZOLE 20 MG CAPSULE D: 20 | 30 days supply | Qty: 30 | Fill #1

## 2018-07-17 MED FILL — BENAZEPRIL HCL 40 MG TABLET: 40 | 30 days supply | Qty: 30 | Fill #1

## 2018-07-17 MED FILL — GABAPENTIN 300 MG CAPSULE: 300 | 30 days supply | Qty: 120 | Fill #0

## 2018-08-06 MED FILL — GLIMEPIRIDE 4 MG TABS: 4 | 30 days supply | Qty: 60 | Fill #4

## 2018-08-06 MED FILL — HYDROCHLOROTHIAZIDE 25 MG T: 25 | 30 days supply | Qty: 30 | Fill #3

## 2018-08-06 MED FILL — POTASSIUM CL ER 20 MEQ TAB: 20 | 30 days supply | Qty: 30 | Fill #2

## 2018-08-13 MED FILL — !LANTUS 100 UNITS/ML VIAL: 100 | 23 days supply | Qty: 30 | Fill #0

## 2018-08-13 MED FILL — CARVEDILOL 12.5 MG TABLET: 12.5 | 90 days supply | Qty: 180 | Fill #0

## 2018-08-14 MED FILL — SIMVASTATIN 10 MG TABLET: 10 | 30 days supply | Qty: 30 | Fill #0

## 2018-08-21 ENCOUNTER — Other Ambulatory Visit: Payer: Self-pay

## 2018-08-21 ENCOUNTER — Encounter: Payer: Self-pay | Admitting: Family Medicine

## 2018-08-21 ENCOUNTER — Ambulatory Visit: Payer: BC Managed Care – PPO | Attending: Family Medicine | Admitting: Family Medicine

## 2018-08-21 VITALS — BP 170/92 | HR 80 | Temp 98.8°F | Ht 64.0 in | Wt 198.6 lb

## 2018-08-21 DIAGNOSIS — K3184 Gastroparesis: Secondary | ICD-10-CM

## 2018-08-21 DIAGNOSIS — J4520 Mild intermittent asthma, uncomplicated: Secondary | ICD-10-CM

## 2018-08-21 DIAGNOSIS — E7800 Pure hypercholesterolemia, unspecified: Secondary | ICD-10-CM

## 2018-08-21 DIAGNOSIS — E1143 Type 2 diabetes mellitus with diabetic autonomic (poly)neuropathy: Secondary | ICD-10-CM

## 2018-08-21 DIAGNOSIS — E1142 Type 2 diabetes mellitus with diabetic polyneuropathy: Secondary | ICD-10-CM | POA: Diagnosis not present

## 2018-08-21 DIAGNOSIS — I10 Essential (primary) hypertension: Secondary | ICD-10-CM | POA: Diagnosis not present

## 2018-08-21 DIAGNOSIS — Z794 Long term (current) use of insulin: Secondary | ICD-10-CM

## 2018-08-21 DIAGNOSIS — K257 Chronic gastric ulcer without hemorrhage or perforation: Secondary | ICD-10-CM

## 2018-08-21 DIAGNOSIS — R21 Rash and other nonspecific skin eruption: Secondary | ICD-10-CM

## 2018-08-21 DIAGNOSIS — E876 Hypokalemia: Secondary | ICD-10-CM

## 2018-08-21 LAB — POCT GLYCOSYLATED HEMOGLOBIN (HGB A1C): HbA1c, POC (controlled diabetic range): 6.7 % (ref 0.0–7.0)

## 2018-08-21 LAB — GLUCOSE, POCT (MANUAL RESULT ENTRY): POC Glucose: 124 mg/dl — AB (ref 70–99)

## 2018-08-21 MED ORDER — CARVEDILOL 12.5 MG PO TABS: 18.7500 mg | ORAL_TABLET | Freq: Two times a day (BID) | ORAL | 1 refills | Status: DC

## 2018-08-21 MED ORDER — OMEPRAZOLE 20 MG PO CPDR: 20.0000 mg | DELAYED_RELEASE_CAPSULE | Freq: Every day | ORAL | 1 refills | Status: DC

## 2018-08-21 MED ORDER — GLIMEPIRIDE 4 MG PO TABS: 4.0000 mg | ORAL_TABLET | Freq: Two times a day (BID) | ORAL | 1 refills | Status: DC

## 2018-08-21 MED ORDER — POTASSIUM CHLORIDE CRYS ER 20 MEQ PO TBCR: 20.0000 meq | EXTENDED_RELEASE_TABLET | Freq: Every day | ORAL | 1 refills | Status: DC

## 2018-08-21 MED ORDER — DULERA 100-5 MCG/ACT IN AERO: 2.0000 | INHALATION_SPRAY | Freq: Two times a day (BID) | RESPIRATORY_TRACT | 3 refills | Status: DC

## 2018-08-21 MED ORDER — FLUTICASONE PROPIONATE 50 MCG/ACT NA SUSP: 2.0000 | Freq: Every day | NASAL | 6 refills | Status: AC

## 2018-08-21 MED ORDER — BENAZEPRIL HCL 40 MG PO TABS: 40.0000 mg | ORAL_TABLET | Freq: Every day | ORAL | 1 refills | Status: DC

## 2018-08-21 MED ORDER — LORATADINE 10 MG PO TABS: 10.0000 mg | ORAL_TABLET | Freq: Every day | ORAL | 3 refills | Status: AC

## 2018-08-21 MED ORDER — HYDROCHLOROTHIAZIDE 25 MG PO TABS: 25.0000 mg | ORAL_TABLET | Freq: Every day | ORAL | 1 refills | Status: DC

## 2018-08-21 MED ORDER — METOCLOPRAMIDE HCL 10 MG PO TABS: ORAL_TABLET | ORAL | 0 refills | Status: DC

## 2018-08-21 MED ORDER — GABAPENTIN 300 MG PO CAPS: 600.0000 mg | ORAL_CAPSULE | Freq: Two times a day (BID) | ORAL | 1 refills | Status: DC

## 2018-08-21 MED ORDER — INSULIN GLARGINE 100 UNIT/ML ~~LOC~~ SOLN: 63.0000 [IU] | Freq: Two times a day (BID) | SUBCUTANEOUS | 6 refills | Status: DC

## 2018-08-21 MED ORDER — SIMVASTATIN 10 MG PO TABS: 10.0000 mg | ORAL_TABLET | Freq: Every day | ORAL | 1 refills | Status: DC

## 2018-08-21 MED FILL — OMEPRAZOLE 20 MG CAP: 20 | 30 days supply | Qty: 30 | Fill #0

## 2018-08-21 MED FILL — BENAZEPRIL HCL 40 MG TABLET: 40 | 30 days supply | Qty: 30 | Fill #0

## 2018-08-21 MED FILL — METOCLOPRAMIDE 10 MG TABLET: 10 | 7 days supply | Qty: 30 | Fill #0

## 2018-08-21 MED FILL — GABAPENTIN 300 MG CAPSULE: 300 | 30 days supply | Qty: 120 | Fill #0
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
## 2018-08-21 NOTE — Progress Notes (Signed)
Patient has rash on both feet.

## 2018-08-22 LAB — CMP14+EGFR
ALT: 16 IU/L (ref 0–32)
AST: 20 IU/L (ref 0–40)
Albumin/Globulin Ratio: 1.9 (ref 1.2–2.2)
Albumin: 4.5 g/dL (ref 3.8–4.9)
Alkaline Phosphatase: 74 IU/L (ref 39–117)
BUN/Creatinine Ratio: 14 (ref 9–23)
BUN: 13 mg/dL (ref 6–24)
Bilirubin Total: 0.4 mg/dL (ref 0.0–1.2)
CO2: 29 mmol/L (ref 20–29)
Calcium: 9.4 mg/dL (ref 8.7–10.2)
Chloride: 98 mmol/L (ref 96–106)
Creatinine, Ser: 0.93 mg/dL (ref 0.57–1.00)
GFR calc Af Amer: 79 mL/min/{1.73_m2} (ref >59)
GFR calc non Af Amer: 69 mL/min/{1.73_m2} (ref >59)
Globulin, Total: 2.4 g/dL (ref 1.5–4.5)
Glucose: 103 mg/dL — ABNORMAL HIGH (ref 65–99)
Potassium: 3.4 mmol/L — ABNORMAL LOW (ref 3.5–5.2)
Sodium: 140 mmol/L (ref 134–144)
Total Protein: 6.9 g/dL (ref 6.0–8.5)

## 2018-08-22 LAB — MICROALBUMIN / CREATININE URINE RATIO
Creatinine, Urine: 110.8 mg/dL
Microalb/Creat Ratio: 65 mg/g creat — ABNORMAL HIGH (ref 0–29)
Microalbumin, Urine: 71.5 ug/mL

## 2018-08-27 IMAGING — CR DG CHEST 2V
2 series · 2 of 2 positions shown · non-contrast
Comparison: 03/23/2015

CLINICAL DATA: Cough, wheeze and shortness of breath for 5 days

EXAM:
CHEST  2 VIEW

[chest pa]
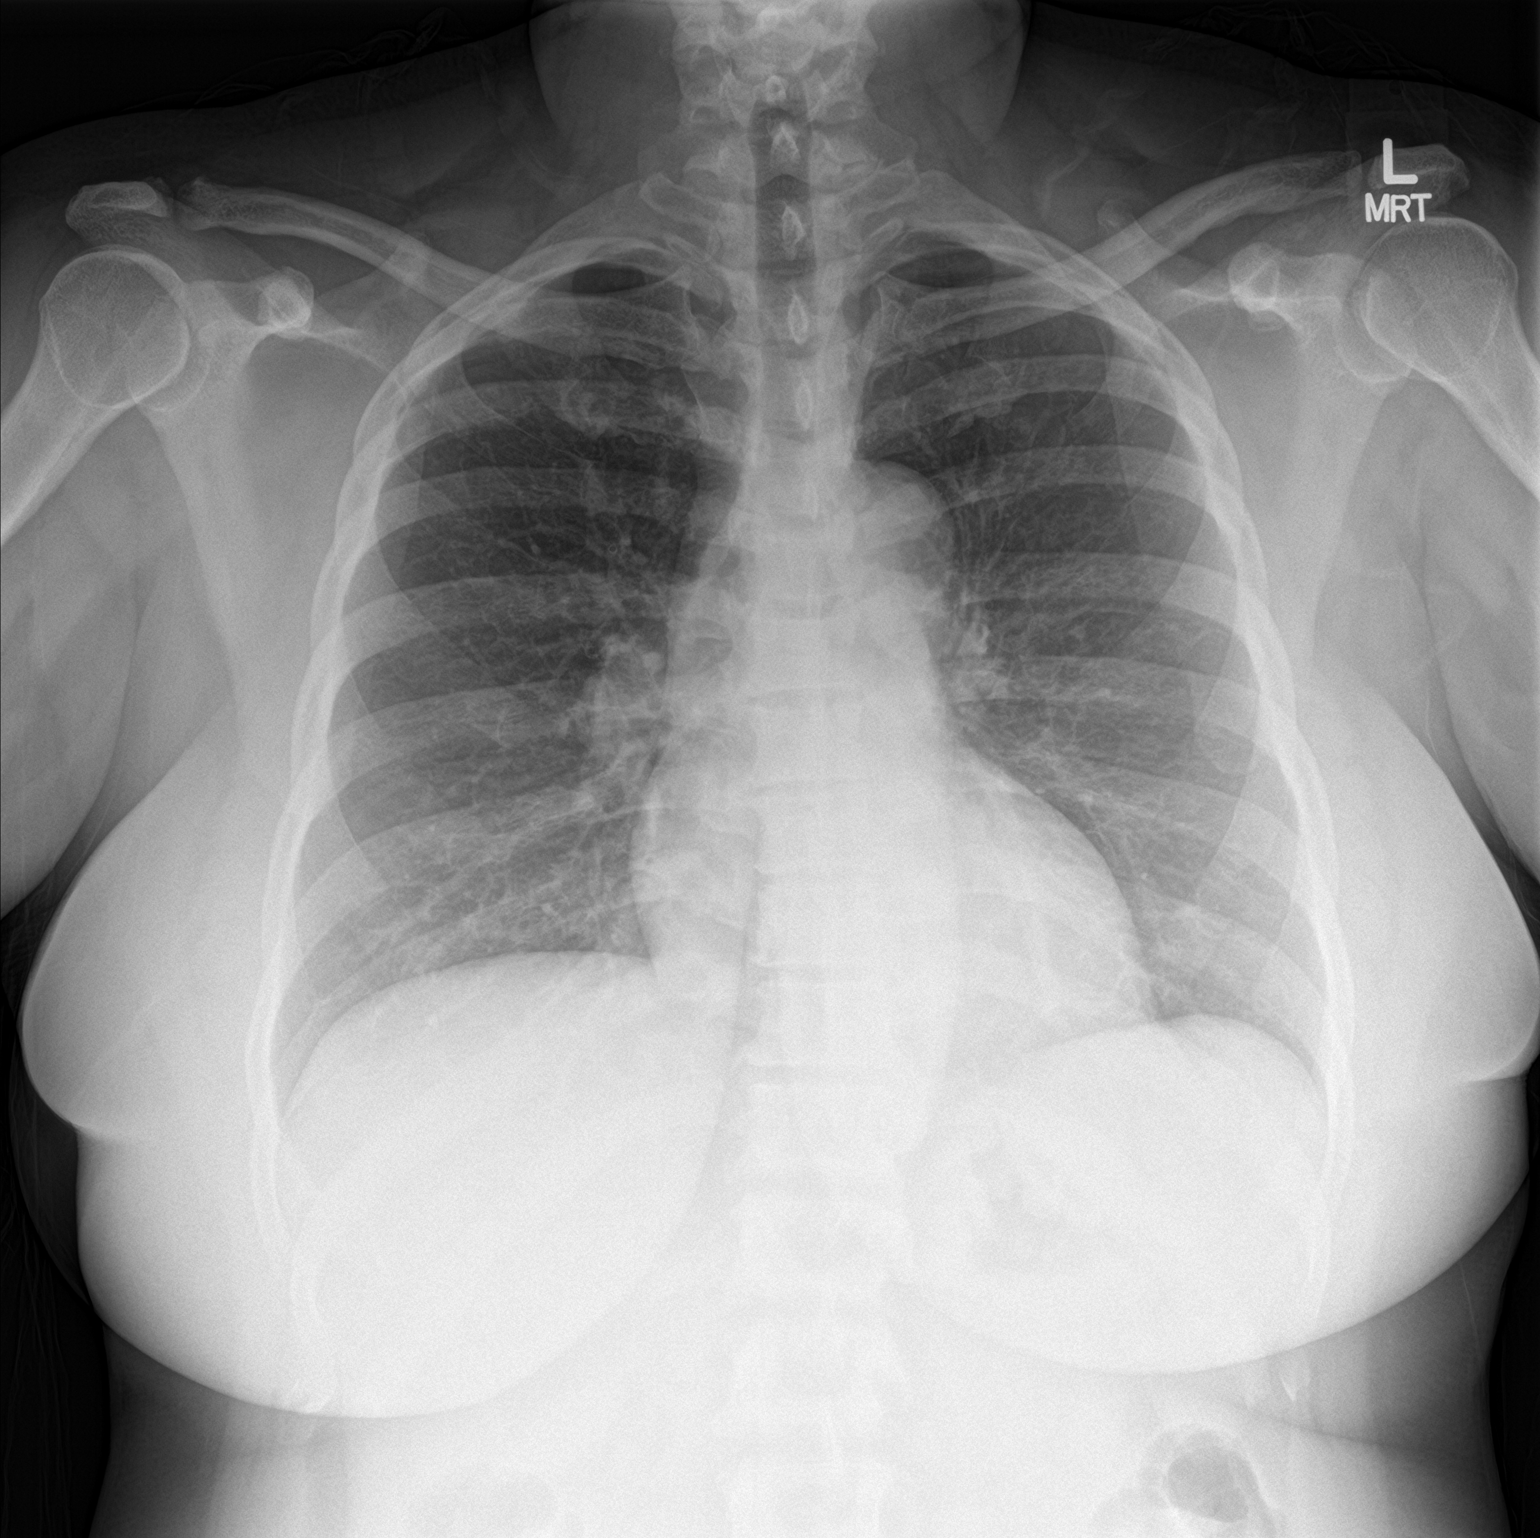

[chest lat]
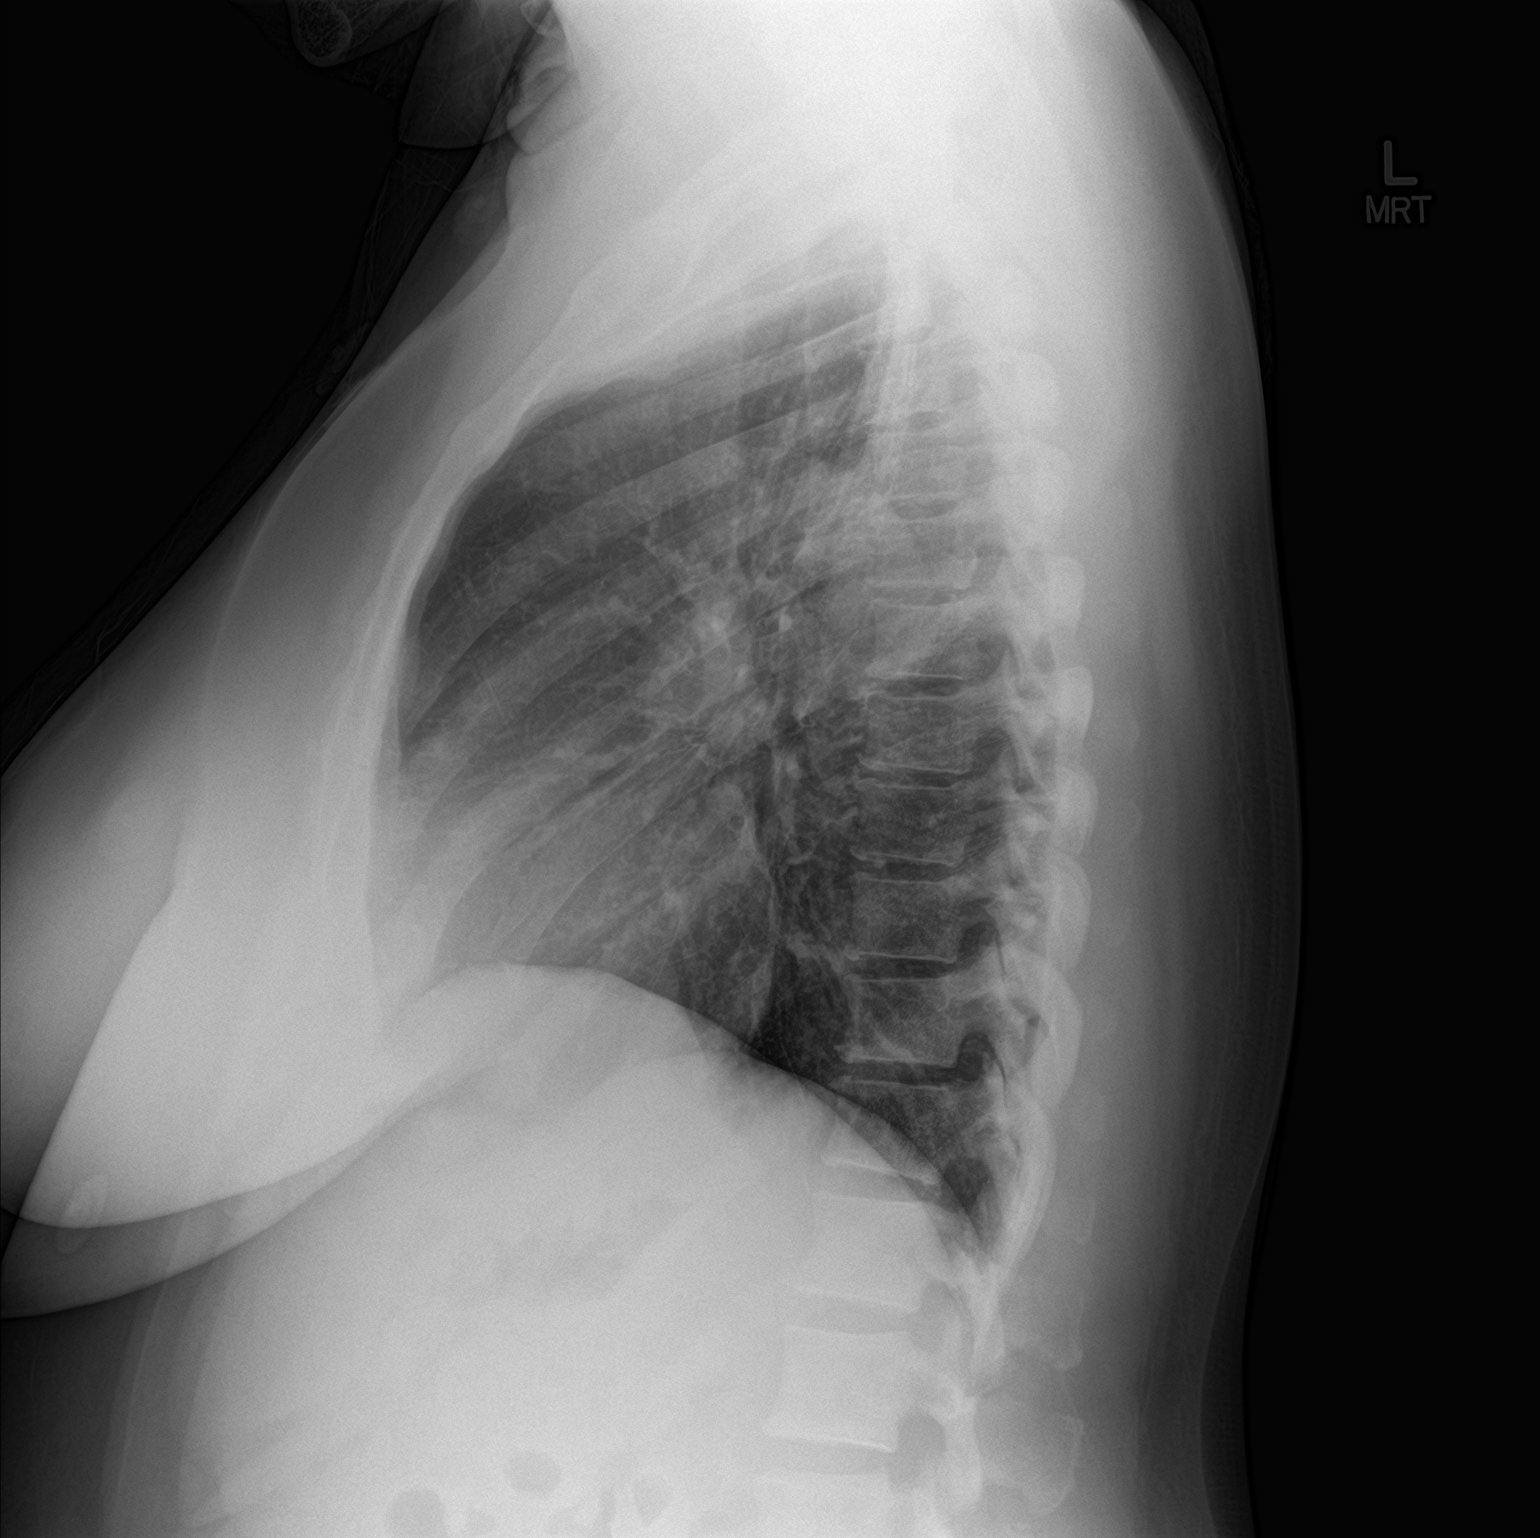

[2 of 2 positions shown; findings below may reference images not displayed]

FINDINGS: Normal heart size and mediastinal contours. Mild interstitial
coarsening that is stable. No acute infiltrate or edema. No effusion
or pneumothorax. No acute osseous findings.
IMPRESSION: Negative chest.

## 2018-09-03 MED FILL — GLIMEPIRIDE 4 MG TABS: 4 | 30 days supply | Qty: 60 | Fill #5

## 2018-09-03 MED FILL — POTASSIUM CL ER 20 MEQ TAB: 20 | 30 days supply | Qty: 30 | Fill #3

## 2018-09-13 MED FILL — HYDROCHLOROTHIAZIDE 25 MG T: 25 | 30 days supply | Qty: 30 | Fill #0

## 2018-09-13 MED FILL — LANTUS 100 UNITS/ML VIAL: 100 | 23 days supply | Qty: 30 | Fill #1

## 2018-09-18 MED FILL — SIMVASTATIN 10 MG TABLET: 10 | 30 days supply | Qty: 30 | Fill #1

## 2018-09-27 ENCOUNTER — Encounter (HOSPITAL_COMMUNITY): Payer: Self-pay | Admitting: Emergency Medicine

## 2018-09-27 ENCOUNTER — Other Ambulatory Visit: Payer: Self-pay

## 2018-09-27 ENCOUNTER — Ambulatory Visit (HOSPITAL_COMMUNITY)
Admission: EM | Admit: 2018-09-27 | Discharge: 2018-09-27 | Disposition: A | Discharge status: Home / Self Care | Payer: BC Managed Care – PPO | Attending: Family Medicine | Admitting: Family Medicine

## 2018-09-27 DIAGNOSIS — B309 Viral conjunctivitis, unspecified: Secondary | ICD-10-CM | POA: Diagnosis not present

## 2018-09-27 MED ORDER — OLOPATADINE HCL 0.2 % OP SOLN: 1.0000 [drp] | Freq: Two times a day (BID) | OPHTHALMIC | 1 refills | Status: DC | PRN

## 2018-09-27 NOTE — Discharge Instructions (Signed)
Wash hands often Use eye drops as directed See an eye doctor if worse Call for problems

## 2018-09-27 NOTE — ED Triage Notes (Signed)
Pt reports waking up this morning with eye pain and irritation.  She states it has been watery but she says it was not "crusty".
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
## 2018-10-01 ENCOUNTER — Telehealth: Payer: Self-pay | Admitting: Family Medicine

## 2018-10-01 NOTE — Telephone Encounter (Signed)
Please offer patient an appointment with another provider who can see patient as a work-in/urgent visit. See if patient is willing to be see at [facility] or [facility] if she cannot get an appointment here soon

## 2018-10-01 NOTE — Telephone Encounter (Signed)
Patient called stating she believes she has BV and would like to know what she can do to the next available appointment being far out until august 25th. Please follow up.

## 2018-10-04 ENCOUNTER — Ambulatory Visit (HOSPITAL_BASED_OUTPATIENT_CLINIC_OR_DEPARTMENT_OTHER): Payer: BC Managed Care – PPO | Admitting: Physician Assistant

## 2018-10-04 ENCOUNTER — Other Ambulatory Visit: Payer: Self-pay

## 2018-10-04 ENCOUNTER — Other Ambulatory Visit (HOSPITAL_COMMUNITY)
Admission: RE | Admit: 2018-10-04 | Discharge: 2018-10-04 | Disposition: A | Discharge status: Home / Self Care | Payer: BC Managed Care – PPO | Source: Ambulatory Visit | Attending: Family Medicine | Admitting: Family Medicine

## 2018-10-04 VITALS — BP 144/83 | HR 85 | Temp 98.4°F | Ht 64.0 in | Wt 200.6 lb

## 2018-10-04 DIAGNOSIS — Z794 Long term (current) use of insulin: Secondary | ICD-10-CM | POA: Diagnosis not present

## 2018-10-04 DIAGNOSIS — N898 Other specified noninflammatory disorders of vagina: Secondary | ICD-10-CM | POA: Insufficient documentation

## 2018-10-04 DIAGNOSIS — E1142 Type 2 diabetes mellitus with diabetic polyneuropathy: Secondary | ICD-10-CM | POA: Diagnosis not present

## 2018-10-04 LAB — POCT URINALYSIS DIP (CLINITEK)
Bilirubin, UA: NEGATIVE
Glucose, UA: NEGATIVE mg/dL
Ketones, POC UA: NEGATIVE mg/dL
Leukocytes, UA: NEGATIVE
Nitrite, UA: NEGATIVE
Spec Grav, UA: 1.02 (ref 1.010–1.025)
Urobilinogen, UA: 0.2 E.U./dL
pH, UA: 6 (ref 5.0–8.0)

## 2018-10-04 LAB — GLUCOSE, POCT (MANUAL RESULT ENTRY): POC Glucose: 144 mg/dl — AB (ref 70–99)
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
## 2018-10-08 ENCOUNTER — Telehealth: Payer: Self-pay | Admitting: Family Medicine

## 2018-10-08 MED FILL — HYDROCHLOROTHIAZIDE 25 MG T: 25 | 30 days supply | Qty: 30 | Fill #1

## 2018-10-08 MED FILL — BENAZEPRIL HCL 40 MG TABLET: 40 | 30 days supply | Qty: 30 | Fill #1

## 2018-10-08 MED FILL — POTASSIUM CL ER 20 MEQ TAB: 20 | 30 days supply | Qty: 30 | Fill #4

## 2018-10-08 MED FILL — GLIMEPIRIDE 4 MG TABS: 4 | 30 days supply | Qty: 60 | Fill #0

## 2018-10-08 MED FILL — OMEPRAZOLE 20 MG CAP: 20 | 30 days supply | Qty: 30 | Fill #1

## 2018-10-11 ENCOUNTER — Other Ambulatory Visit: Payer: Self-pay | Admitting: Physician Assistant

## 2018-10-11 LAB — CERVICOVAGINAL ANCILLARY ONLY
Candida vaginitis: NEGATIVE
Chlamydia: NEGATIVE
Neisseria Gonorrhea: NEGATIVE
Trichomonas: NEGATIVE

## 2018-10-11 MED ORDER — METRONIDAZOLE 500 MG PO TABS: 500.0000 mg | ORAL_TABLET | Freq: Two times a day (BID) | ORAL | 0 refills | Status: DC

## 2018-10-11 MED FILL — metroNIDAZOLE 500 MG TABS: 500 | 7 days supply | Qty: 14 | Fill #0

## 2018-10-12 ENCOUNTER — Telehealth: Payer: Self-pay | Admitting: *Deleted

## 2018-10-12 MED ORDER — FLUCONAZOLE 150 MG PO TABS: 150.0000 mg | ORAL_TABLET | Freq: Once | ORAL | 0 refills | Status: AC

## 2018-10-12 MED FILL — FLUCONAZOLE 150 MG TABLET: 150 | 1 days supply | Qty: 1 | Fill #0

## 2018-10-12 NOTE — Telephone Encounter (Signed)
Pt request medication yeast infection take after taking ATB for BV. Please advise.

## 2018-10-12 NOTE — Telephone Encounter (Signed)
Patient aware.

## 2018-10-12 NOTE — Telephone Encounter (Signed)
Diflucan sent to pharmacy.

## 2018-10-15 MED FILL — CARVEDILOL 12.5 MG TABLET: 12.5 | 30 days supply | Qty: 90 | Fill #0

## 2018-10-22 MED FILL — LANTUS 100 UNITS/ML VIAL: 100 | 23 days supply | Qty: 30 | Fill #2

## 2018-10-22 MED FILL — SIMVASTATIN 10 MG TABLET: 10 | 30 days supply | Qty: 30 | Fill #2

## 2018-10-23 ENCOUNTER — Ambulatory Visit (HOSPITAL_BASED_OUTPATIENT_CLINIC_OR_DEPARTMENT_OTHER): Payer: BC Managed Care – PPO | Admitting: Family Medicine

## 2018-10-23 ENCOUNTER — Other Ambulatory Visit: Payer: Self-pay | Admitting: Family Medicine

## 2018-10-23 ENCOUNTER — Other Ambulatory Visit (HOSPITAL_COMMUNITY)
Admission: RE | Admit: 2018-10-23 | Discharge: 2018-10-23 | Disposition: A | Discharge status: Home / Self Care | Payer: BC Managed Care – PPO | Source: Ambulatory Visit | Attending: Family Medicine | Admitting: Family Medicine

## 2018-10-23 ENCOUNTER — Encounter: Payer: Self-pay | Admitting: Family Medicine

## 2018-10-23 ENCOUNTER — Other Ambulatory Visit: Payer: Self-pay

## 2018-10-23 VITALS — BP 166/98 | HR 76 | Temp 98.2°F | Ht 64.0 in | Wt 196.6 lb

## 2018-10-23 DIAGNOSIS — Z1239 Encounter for other screening for malignant neoplasm of breast: Secondary | ICD-10-CM | POA: Diagnosis not present

## 2018-10-23 DIAGNOSIS — T148XXA Other injury of unspecified body region, initial encounter: Secondary | ICD-10-CM

## 2018-10-23 DIAGNOSIS — Z124 Encounter for screening for malignant neoplasm of cervix: Secondary | ICD-10-CM | POA: Insufficient documentation

## 2018-10-23 DIAGNOSIS — J4520 Mild intermittent asthma, uncomplicated: Secondary | ICD-10-CM

## 2018-10-23 MED FILL — !VENTOLIN HFA INHALER: 108 (90 BAS | 25 days supply | Qty: 18 | Fill #0
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
## 2018-10-25 LAB — CERVICOVAGINAL ANCILLARY ONLY
Bacterial vaginitis: NEGATIVE
Candida vaginitis: NEGATIVE

## 2018-10-26 LAB — CYTOLOGY - PAP
Diagnosis: NEGATIVE
HPV: NOT DETECTED

## 2018-10-31 ENCOUNTER — Ambulatory Visit
Admission: RE | Admit: 2018-10-31 | Discharge: 2018-10-31 | Disposition: A | Discharge status: Home / Self Care | Payer: BC Managed Care – PPO | Source: Ambulatory Visit | Attending: Family Medicine | Admitting: Family Medicine

## 2018-10-31 ENCOUNTER — Other Ambulatory Visit: Payer: Self-pay

## 2018-10-31 ENCOUNTER — Other Ambulatory Visit: Payer: Self-pay | Admitting: Family Medicine

## 2018-10-31 DIAGNOSIS — N6002 Solitary cyst of left breast: Secondary | ICD-10-CM | POA: Diagnosis not present

## 2018-10-31 DIAGNOSIS — N6320 Unspecified lump in the left breast, unspecified quadrant: Secondary | ICD-10-CM

## 2018-10-31 DIAGNOSIS — R928 Other abnormal and inconclusive findings on diagnostic imaging of breast: Secondary | ICD-10-CM | POA: Diagnosis not present

## 2018-10-31 DIAGNOSIS — T148XXA Other injury of unspecified body region, initial encounter: Secondary | ICD-10-CM

## 2018-10-31 DIAGNOSIS — Z1239 Encounter for other screening for malignant neoplasm of breast: Secondary | ICD-10-CM

## 2018-11-06 MED FILL — HYDROCHLOROTHIAZIDE 25 MG T: 25 | 30 days supply | Qty: 30 | Fill #2

## 2018-11-06 MED FILL — BENAZEPRIL HCL 40 MG TABLET: 40 | 30 days supply | Qty: 30 | Fill #2

## 2018-11-06 MED FILL — GLIMEPIRIDE 4 MG TABS: 4 | 30 days supply | Qty: 60 | Fill #1

## 2018-11-06 MED FILL — OMEPRAZOLE 20 MG CAP: 20 | 30 days supply | Qty: 30 | Fill #2

## 2018-11-06 MED FILL — POTASSIUM CL ER 20 MEQ TAB: 20 | 30 days supply | Qty: 30 | Fill #5

## 2018-11-08 ENCOUNTER — Other Ambulatory Visit: Payer: Self-pay | Admitting: Family Medicine

## 2018-11-09 ENCOUNTER — Telehealth: Payer: Self-pay | Admitting: Family Medicine

## 2018-11-09 MED ORDER — FLUCONAZOLE 150 MG PO TABS: 150.0000 mg | ORAL_TABLET | Freq: Once | ORAL | 0 refills | Status: AC

## 2018-11-09 MED FILL — FLUCONAZOLE 150 MG TABLET: 150 | 1 days supply | Qty: 1 | Fill #0

## 2018-11-09 NOTE — Telephone Encounter (Signed)
New Message   Pt states her medication request for a prescription for a yeast infection was denied and the pt wants to know why. Please f/u

## 2018-11-09 NOTE — Telephone Encounter (Signed)
Prescription sent to pharmacy.

## 2018-11-09 NOTE — Telephone Encounter (Signed)
Patient is requesting pill for yeast infection and wants to know why medication was denied.

## 2018-11-09 NOTE — Telephone Encounter (Signed)
Patient was called and informed of medication being sent to pharmacy. 

## 2018-11-19 MED FILL — LANTUS 100 UNITS/ML VIAL: 100 | 23 days supply | Qty: 30 | Fill #3

## 2018-11-20 ENCOUNTER — Telehealth: Payer: Self-pay | Admitting: Family Medicine

## 2018-11-20 MED FILL — CARVEDILOL 12.5 MG TABLET: 12.5 | 30 days supply | Qty: 90 | Fill #1

## 2018-11-20 NOTE — Telephone Encounter (Signed)
Patient states that she still has discharge she does not know if it is BV or a yeast infection.

## 2018-11-20 NOTE — Telephone Encounter (Signed)
Patient called wanting to talk to someone about some issues going on. Please follow up.

## 2018-11-21 ENCOUNTER — Ambulatory Visit: Payer: BC Managed Care – PPO | Admitting: Family Medicine

## 2018-11-21 MED ORDER — FLUCONAZOLE 150 MG PO TABS: 150.0000 mg | ORAL_TABLET | Freq: Once | ORAL | 0 refills | Status: AC

## 2018-11-21 MED ORDER — METRONIDAZOLE 0.75 % VA GEL: 1.0000 | Freq: Every day | VAGINAL | 0 refills | Status: DC

## 2018-11-21 MED FILL — FLUCONAZOLE 150 MG TABLET: 150 | 1 days supply | Qty: 1 | Fill #0

## 2018-11-21 NOTE — Telephone Encounter (Signed)
Patient was called and informed of medication being sent to pharmacy. 

## 2018-11-21 NOTE — Telephone Encounter (Signed)
I have sent a prescription for Diflucan and MetroGel to treat both BV and yeast infection.

## 2018-11-26 ENCOUNTER — Other Ambulatory Visit: Payer: Self-pay

## 2018-11-26 ENCOUNTER — Ambulatory Visit (HOSPITAL_BASED_OUTPATIENT_CLINIC_OR_DEPARTMENT_OTHER): Payer: BC Managed Care – PPO | Admitting: Family Medicine

## 2018-11-26 ENCOUNTER — Encounter: Payer: Self-pay | Admitting: Family Medicine

## 2018-11-26 ENCOUNTER — Other Ambulatory Visit (HOSPITAL_COMMUNITY)
Admission: RE | Admit: 2018-11-26 | Discharge: 2018-11-26 | Disposition: A | Discharge status: Home / Self Care | Payer: BC Managed Care – PPO | Source: Ambulatory Visit | Attending: Family Medicine | Admitting: Family Medicine

## 2018-11-26 VITALS — BP 168/92 | HR 79 | Temp 98.3°F | Ht 64.0 in | Wt 197.0 lb

## 2018-11-26 DIAGNOSIS — Z79899 Other long term (current) drug therapy: Secondary | ICD-10-CM | POA: Diagnosis not present

## 2018-11-26 DIAGNOSIS — Z794 Long term (current) use of insulin: Secondary | ICD-10-CM | POA: Diagnosis not present

## 2018-11-26 DIAGNOSIS — E1142 Type 2 diabetes mellitus with diabetic polyneuropathy: Secondary | ICD-10-CM

## 2018-11-26 DIAGNOSIS — I10 Essential (primary) hypertension: Secondary | ICD-10-CM

## 2018-11-26 DIAGNOSIS — Z23 Encounter for immunization: Secondary | ICD-10-CM

## 2018-11-26 DIAGNOSIS — N898 Other specified noninflammatory disorders of vagina: Secondary | ICD-10-CM

## 2018-11-26 LAB — GLUCOSE, POCT (MANUAL RESULT ENTRY): POC Glucose: 93 mg/dl (ref 70–99)

## 2018-11-26 LAB — POCT GLYCOSYLATED HEMOGLOBIN (HGB A1C): HbA1c, POC (controlled diabetic range): 6.9 % (ref 0.0–7.0)

## 2018-11-26 MED ORDER — CARVEDILOL 25 MG PO TABS: 25.0000 mg | ORAL_TABLET | Freq: Two times a day (BID) | ORAL | 1 refills | Status: DC

## 2018-11-26 MED FILL — metroNIDAZOLE 0.75 % GEL: 0.75 | 5 days supply | Qty: 70 | Fill #0

## 2018-11-26 MED FILL — CARVEDILOL 25 MG TABLET: 25 | 30 days supply | Qty: 60 | Fill #0

## 2018-11-26 NOTE — Patient Instructions (Signed)

## 2018-11-27 LAB — CERVICOVAGINAL ANCILLARY ONLY
Bacterial Vaginitis (gardnerella): POSITIVE — AB
Candida Glabrata: NEGATIVE
Candida Vaginitis: NEGATIVE
Chlamydia: NEGATIVE
Molecular Disclaimer: NEGATIVE
Molecular Disclaimer: NEGATIVE
Molecular Disclaimer: NEGATIVE
Molecular Disclaimer: NEGATIVE
Molecular Disclaimer: NORMAL
Molecular Disclaimer: NORMAL
Neisseria Gonorrhea: NEGATIVE
Trichomonas: POSITIVE — AB
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
## 2018-11-28 ENCOUNTER — Other Ambulatory Visit: Payer: Self-pay | Admitting: Family Medicine

## 2018-11-28 MED ORDER — METRONIDAZOLE 500 MG PO TABS: 2000.0000 mg | ORAL_TABLET | Freq: Once | ORAL | 0 refills | Status: AC

## 2018-11-29 ENCOUNTER — Telehealth: Payer: Self-pay | Admitting: Family Medicine

## 2018-11-29 MED FILL — metroNIDAZOLE 500 MG TABS: 500 | 1 days supply | Qty: 4 | Fill #0

## 2018-11-29 NOTE — Telephone Encounter (Signed)
Patient called for their lab results. Please follow up.  °

## 2018-12-03 MED FILL — SIMVASTATIN 10 MG TABLET: 10 | 30 days supply | Qty: 30 | Fill #3

## 2018-12-03 MED FILL — OMEPRAZOLE 20 MG CAP: 20 | 30 days supply | Qty: 30 | Fill #3

## 2018-12-13 MED FILL — POTASSIUM CL ER 20 MEQ TAB: 20 | 30 days supply | Qty: 30 | Fill #0

## 2018-12-13 MED FILL — BENAZEPRIL HCL 40 MG TABLET: 40 | 30 days supply | Qty: 30 | Fill #3

## 2018-12-13 MED FILL — GLIMEPIRIDE 4 MG TABS: 4 | 30 days supply | Qty: 60 | Fill #2

## 2018-12-20 MED FILL — LANTUS 100 UNITS/ML VIAL: 100 | 23 days supply | Qty: 30 | Fill #4

## 2018-12-20 MED FILL — HYDROCHLOROTHIAZIDE 25 MG T: 25 | 30 days supply | Qty: 30 | Fill #3

## 2019-01-01 ENCOUNTER — Telehealth: Payer: Self-pay | Admitting: Family Medicine

## 2019-01-01 NOTE — Telephone Encounter (Signed)
Pt called stating she is having sharp pain in her hands, wants to know if this is related to her diabetes, please advise or if scheduling is needed please forward message back

## 2019-01-01 NOTE — Telephone Encounter (Signed)
Make patient an appointment with any available pcp

## 2019-01-07 MED FILL — OMEPRAZOLE 20 MG CAP: 20 | 30 days supply | Qty: 30 | Fill #4

## 2019-01-07 MED FILL — BENAZEPRIL HCL 40 MG TABLET: 40 | 30 days supply | Qty: 30 | Fill #4

## 2019-01-09 ENCOUNTER — Ambulatory Visit: Payer: BC Managed Care – PPO

## 2019-01-09 MED FILL — SIMVASTATIN 10 MG TABLET: 10 | 30 days supply | Qty: 30 | Fill #4

## 2019-01-09 MED FILL — POTASSIUM CL ER 20 MEQ TAB: 20 | 30 days supply | Qty: 30 | Fill #1

## 2019-01-09 MED FILL — GABAPENTIN 300 MG CAPSULE: 300 | 30 days supply | Qty: 120 | Fill #1

## 2019-01-09 MED FILL — GLIMEPIRIDE 4 MG TABS: 4 | 30 days supply | Qty: 60 | Fill #3

## 2019-01-16 MED FILL — LANTUS 100 UNITS/ML VIAL: 100 | 23 days supply | Qty: 30 | Fill #5

## 2019-01-18 MED FILL — CARVEDILOL 25 MG TABLET: 25 | 30 days supply | Qty: 60 | Fill #1

## 2019-01-28 MED FILL — HYDROCHLOROTHIAZIDE 25 MG T: 25 | 30 days supply | Qty: 30 | Fill #4

## 2019-02-07 MED FILL — OMEPRAZOLE 20 MG CAP: 20 | 30 days supply | Qty: 30 | Fill #5

## 2019-02-07 MED FILL — GLIMEPIRIDE 4 MG TABS: 4 | 30 days supply | Qty: 60 | Fill #4

## 2019-02-07 MED FILL — BENAZEPRIL HCL 40 MG TABLET: 40 | 30 days supply | Qty: 30 | Fill #5

## 2019-02-07 MED FILL — POTASSIUM CL ER 20 MEQ TAB: 20 | 30 days supply | Qty: 30 | Fill #2

## 2019-02-07 MED FILL — SIMVASTATIN 10 MG TABLET: 10 | 30 days supply | Qty: 30 | Fill #5

## 2019-02-14 MED FILL — GABAPENTIN 300 MG CAPSULE: 300 | 30 days supply | Qty: 120 | Fill #2

## 2019-02-14 MED FILL — LANTUS 100 UNITS/ML VIAL: 100 | 23 days supply | Qty: 30 | Fill #6

## 2019-02-18 ENCOUNTER — Ambulatory Visit: Payer: BC Managed Care – PPO | Admitting: Family Medicine

## 2019-02-25 MED FILL — CARVEDILOL 25 MG TABLET: 25 | 30 days supply | Qty: 60 | Fill #2

## 2019-02-25 MED FILL — HYDROCHLOROTHIAZIDE 25 MG T: 25 | 30 days supply | Qty: 30 | Fill #5

## 2019-02-26 ENCOUNTER — Other Ambulatory Visit: Payer: Self-pay

## 2019-02-26 ENCOUNTER — Ambulatory Visit: Payer: Self-pay | Attending: Family Medicine | Admitting: Family Medicine

## 2019-02-26 ENCOUNTER — Encounter: Payer: Self-pay | Admitting: Family Medicine

## 2019-02-26 VITALS — BP 176/101 | HR 80 | Temp 98.2°F | Ht 64.0 in | Wt 197.0 lb

## 2019-02-26 DIAGNOSIS — K257 Chronic gastric ulcer without hemorrhage or perforation: Secondary | ICD-10-CM

## 2019-02-26 DIAGNOSIS — E1142 Type 2 diabetes mellitus with diabetic polyneuropathy: Secondary | ICD-10-CM

## 2019-02-26 DIAGNOSIS — G4709 Other insomnia: Secondary | ICD-10-CM

## 2019-02-26 DIAGNOSIS — E7800 Pure hypercholesterolemia, unspecified: Secondary | ICD-10-CM

## 2019-02-26 DIAGNOSIS — J4520 Mild intermittent asthma, uncomplicated: Secondary | ICD-10-CM

## 2019-02-26 DIAGNOSIS — Z794 Long term (current) use of insulin: Secondary | ICD-10-CM

## 2019-02-26 DIAGNOSIS — I10 Essential (primary) hypertension: Secondary | ICD-10-CM

## 2019-02-26 LAB — GLUCOSE, POCT (MANUAL RESULT ENTRY): POC Glucose: 143 mg/dl — AB (ref 70–99)

## 2019-02-26 LAB — POCT GLYCOSYLATED HEMOGLOBIN (HGB A1C): HbA1c, POC (controlled diabetic range): 6.3 % (ref 0.0–7.0)

## 2019-02-26 MED ORDER — TRAZODONE HCL 100 MG PO TABS: 50.0000 mg | ORAL_TABLET | Freq: Every day | ORAL | 1 refills | Status: DC

## 2019-02-26 MED ORDER — OMEPRAZOLE 20 MG PO CPDR: 20.0000 mg | DELAYED_RELEASE_CAPSULE | Freq: Every day | ORAL | 1 refills | Status: DC

## 2019-02-26 MED ORDER — INSULIN GLARGINE 100 UNIT/ML ~~LOC~~ SOLN: 60.0000 [IU] | Freq: Two times a day (BID) | SUBCUTANEOUS | 6 refills | Status: DC

## 2019-02-26 MED ORDER — GLIMEPIRIDE 4 MG PO TABS: 4.0000 mg | ORAL_TABLET | Freq: Two times a day (BID) | ORAL | 1 refills | Status: DC

## 2019-02-26 MED ORDER — GABAPENTIN 300 MG PO CAPS: 600.0000 mg | ORAL_CAPSULE | Freq: Two times a day (BID) | ORAL | 1 refills | Status: DC

## 2019-02-26 MED ORDER — SIMVASTATIN 10 MG PO TABS: 10.0000 mg | ORAL_TABLET | Freq: Every day | ORAL | 1 refills | Status: DC

## 2019-02-26 MED ORDER — HYDROCHLOROTHIAZIDE 25 MG PO TABS: 25.0000 mg | ORAL_TABLET | Freq: Every day | ORAL | 1 refills | Status: DC

## 2019-02-26 MED ORDER — DULERA 100-5 MCG/ACT IN AERO: 2.0000 | INHALATION_SPRAY | Freq: Two times a day (BID) | RESPIRATORY_TRACT | 3 refills | Status: DC

## 2019-02-26 MED ORDER — SPIRONOLACTONE 25 MG PO TABS: 25.0000 mg | ORAL_TABLET | Freq: Every day | ORAL | 1 refills | Status: DC

## 2019-02-26 MED ORDER — BENAZEPRIL HCL 40 MG PO TABS: 40.0000 mg | ORAL_TABLET | Freq: Every day | ORAL | 1 refills | Status: DC

## 2019-02-26 MED FILL — SPIRONOLACTONE 25 MG TABLET: 25 | 30 days supply | Qty: 30 | Fill #0

## 2019-02-26 MED FILL — DULERA 100 MCG/5 MCG INH: 100-5 | 30 days supply | Qty: 13 | Fill #0

## 2019-02-26 NOTE — Progress Notes (Signed)
Neuropathy pain in hands and legs.
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
## 2019-02-26 NOTE — Patient Instructions (Signed)

## 2019-02-27 MED FILL — VENTOLIN HFA 90 MCG INHALER: 108 (90 BAS | 25 days supply | Qty: 18 | Fill #1

## 2019-03-11 MED FILL — BENAZEPRIL HCL 40 MG TABLET: 40 | 90 days supply | Qty: 90 | Fill #0

## 2019-03-11 MED FILL — SIMVASTATIN 10 MG TABLET: 10 | 90 days supply | Qty: 90 | Fill #0

## 2019-03-11 MED FILL — OMEPRAZOLE 20 MG CAP: 20 | 90 days supply | Qty: 90 | Fill #0

## 2019-03-11 MED FILL — LANTUS 100 UNITS/ML VIAL: 100 | 30 days supply | Qty: 30 | Fill #0

## 2019-03-12 ENCOUNTER — Other Ambulatory Visit: Payer: Self-pay

## 2019-03-12 ENCOUNTER — Ambulatory Visit: Payer: BLUE CROSS/BLUE SHIELD | Attending: Family Medicine

## 2019-03-12 DIAGNOSIS — E1142 Type 2 diabetes mellitus with diabetic polyneuropathy: Secondary | ICD-10-CM | POA: Diagnosis not present

## 2019-03-12 DIAGNOSIS — Z794 Long term (current) use of insulin: Secondary | ICD-10-CM

## 2019-03-13 LAB — BASIC METABOLIC PANEL
BUN/Creatinine Ratio: 15 (ref 9–23)
BUN: 18 mg/dL (ref 6–24)
CO2: 29 mmol/L (ref 20–29)
Calcium: 9.7 mg/dL (ref 8.7–10.2)
Chloride: 97 mmol/L (ref 96–106)
Creatinine, Ser: 1.22 mg/dL — ABNORMAL HIGH (ref 0.57–1.00)
GFR calc Af Amer: 57 mL/min/{1.73_m2} — ABNORMAL LOW (ref >59)
GFR calc non Af Amer: 49 mL/min/{1.73_m2} — ABNORMAL LOW (ref >59)
Glucose: 121 mg/dL — ABNORMAL HIGH (ref 65–99)
Potassium: 4 mmol/L (ref 3.5–5.2)
Sodium: 142 mmol/L (ref 134–144)

## 2019-03-13 LAB — LIPID PANEL
Chol/HDL Ratio: 3.8 ratio (ref 0.0–4.4)
Cholesterol, Total: 169 mg/dL (ref 100–199)
HDL: 45 mg/dL (ref >39)
LDL Chol Calc (NIH): 98 mg/dL (ref 0–99)
Triglycerides: 151 mg/dL — ABNORMAL HIGH (ref 0–149)
VLDL Cholesterol Cal: 26 mg/dL (ref 5–40)

## 2019-03-14 ENCOUNTER — Telehealth: Payer: Self-pay

## 2019-03-14 NOTE — Telephone Encounter (Signed)
Patient viewed results on 03/13/19 at 12:00

## 2019-03-14 NOTE — Telephone Encounter (Signed)
-----   Message from Hoy Register, MD sent at 03/13/2019  9:40 AM EST ----- Can you please relay the MyChart message to the patient?  Thank you.

## 2019-03-18 MED FILL — GLIMEPIRIDE 4 MG TABS: 4 | 30 days supply | Qty: 60 | Fill #5

## 2019-03-22 MED FILL — HYDROCHLOROTHIAZIDE 25 MG T: 25 | 90 days supply | Qty: 90 | Fill #0

## 2019-03-22 MED FILL — SPIRONOLACTONE 25 MG TABLET: 25 | 30 days supply | Qty: 30 | Fill #1

## 2019-03-25 MED FILL — CARVEDILOL 25 MG TABLET: 25 | 30 days supply | Qty: 60 | Fill #3

## 2019-04-11 ENCOUNTER — Encounter (HOSPITAL_COMMUNITY): Payer: Self-pay

## 2019-04-11 ENCOUNTER — Other Ambulatory Visit: Payer: Self-pay

## 2019-04-11 ENCOUNTER — Ambulatory Visit (HOSPITAL_COMMUNITY)
Admission: EM | Admit: 2019-04-11 | Discharge: 2019-04-11 | Disposition: A | Discharge status: Home / Self Care | Payer: BLUE CROSS/BLUE SHIELD | Attending: Family Medicine | Admitting: Family Medicine

## 2019-04-11 DIAGNOSIS — Z825 Family history of asthma and other chronic lower respiratory diseases: Secondary | ICD-10-CM | POA: Insufficient documentation

## 2019-04-11 DIAGNOSIS — J039 Acute tonsillitis, unspecified: Secondary | ICD-10-CM | POA: Diagnosis not present

## 2019-04-11 DIAGNOSIS — Z833 Family history of diabetes mellitus: Secondary | ICD-10-CM | POA: Insufficient documentation

## 2019-04-11 DIAGNOSIS — F329 Major depressive disorder, single episode, unspecified: Secondary | ICD-10-CM | POA: Insufficient documentation

## 2019-04-11 DIAGNOSIS — E1143 Type 2 diabetes mellitus with diabetic autonomic (poly)neuropathy: Secondary | ICD-10-CM | POA: Insufficient documentation

## 2019-04-11 DIAGNOSIS — E1165 Type 2 diabetes mellitus with hyperglycemia: Secondary | ICD-10-CM | POA: Diagnosis not present

## 2019-04-11 DIAGNOSIS — E785 Hyperlipidemia, unspecified: Secondary | ICD-10-CM | POA: Insufficient documentation

## 2019-04-11 DIAGNOSIS — Z8249 Family history of ischemic heart disease and other diseases of the circulatory system: Secondary | ICD-10-CM | POA: Insufficient documentation

## 2019-04-11 DIAGNOSIS — K219 Gastro-esophageal reflux disease without esophagitis: Secondary | ICD-10-CM | POA: Insufficient documentation

## 2019-04-11 DIAGNOSIS — Z79899 Other long term (current) drug therapy: Secondary | ICD-10-CM | POA: Diagnosis not present

## 2019-04-11 DIAGNOSIS — Z794 Long term (current) use of insulin: Secondary | ICD-10-CM | POA: Insufficient documentation

## 2019-04-11 DIAGNOSIS — Z7951 Long term (current) use of inhaled steroids: Secondary | ICD-10-CM | POA: Diagnosis not present

## 2019-04-11 DIAGNOSIS — Z20822 Contact with and (suspected) exposure to covid-19: Secondary | ICD-10-CM | POA: Diagnosis not present

## 2019-04-11 DIAGNOSIS — I10 Essential (primary) hypertension: Secondary | ICD-10-CM | POA: Insufficient documentation

## 2019-04-11 DIAGNOSIS — Z807 Family history of other malignant neoplasms of lymphoid, hematopoietic and related tissues: Secondary | ICD-10-CM | POA: Insufficient documentation

## 2019-04-11 DIAGNOSIS — F411 Generalized anxiety disorder: Secondary | ICD-10-CM | POA: Insufficient documentation

## 2019-04-11 DIAGNOSIS — Z87891 Personal history of nicotine dependence: Secondary | ICD-10-CM | POA: Insufficient documentation

## 2019-04-11 DIAGNOSIS — Z886 Allergy status to analgesic agent status: Secondary | ICD-10-CM | POA: Diagnosis not present

## 2019-04-11 DIAGNOSIS — K3184 Gastroparesis: Secondary | ICD-10-CM | POA: Insufficient documentation

## 2019-04-11 DIAGNOSIS — J45909 Unspecified asthma, uncomplicated: Secondary | ICD-10-CM | POA: Diagnosis not present

## 2019-04-11 DIAGNOSIS — Z8042 Family history of malignant neoplasm of prostate: Secondary | ICD-10-CM | POA: Diagnosis not present

## 2019-04-11 DIAGNOSIS — Z882 Allergy status to sulfonamides status: Secondary | ICD-10-CM | POA: Diagnosis not present

## 2019-04-11 LAB — POCT RAPID STREP A: Streptococcus, Group A Screen (Direct): NEGATIVE

## 2019-04-11 MED ORDER — ONDANSETRON 8 MG PO TBDP: 8.0000 mg | ORAL_TABLET | Freq: Three times a day (TID) | ORAL | 0 refills | Status: DC | PRN

## 2019-04-11 MED ORDER — ONDANSETRON 4 MG PO TBDP
8.0000 mg | ORAL_TABLET | Freq: Once | ORAL | Status: AC
  Administered 2019-04-11: 8 mg via ORAL

## 2019-04-11 MED ORDER — FLUCONAZOLE 150 MG PO TABS: 150.0000 mg | ORAL_TABLET | Freq: Every day | ORAL | 0 refills | Status: DC

## 2019-04-11 MED ORDER — ONDANSETRON 4 MG PO TBDP
ORAL_TABLET | ORAL | Status: AC
  Filled 2019-04-11: qty 2

## 2019-04-11 MED ORDER — CEFADROXIL 500 MG PO CAPS: 500.0000 mg | ORAL_CAPSULE | Freq: Two times a day (BID) | ORAL | 0 refills | Status: DC

## 2019-04-11 NOTE — Discharge Instructions (Addendum)
I am giving you antibiotics for the tonsillitis I prescribed zofran for the nausea Drink plenty of water Tylenol for pain or fever May use salt water gargles or sore throat spray ( like chloraseptic) If you get worse, GO TO ER I gave you a Rx for diflucan in case you get a yeast infection

## 2019-04-11 NOTE — ED Triage Notes (Signed)
Pt state she has a sore throat x 2 days. Nausea and vomiting that started today.
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
## 2019-04-12 MED FILL — LANTUS 100 UNITS/ML VIAL: 100 | 30 days supply | Qty: 30 | Fill #1

## 2019-04-13 LAB — NOVEL CORONAVIRUS, NAA (HOSP ORDER, SEND-OUT TO REF LAB; TAT 18-24 HRS): SARS-CoV-2, NAA: NOT DETECTED

## 2019-04-13 LAB — CULTURE, GROUP A STREP (THRC)

## 2019-04-26 MED FILL — CARVEDILOL 25 MG TABLET: 25 | 30 days supply | Qty: 60 | Fill #4

## 2019-04-26 MED FILL — GLIMEPIRIDE 4 MG TABS: 4 | 30 days supply | Qty: 60 | Fill #0

## 2019-04-26 MED FILL — SPIRONOLACTONE 25 MG TABLET: 25 | 30 days supply | Qty: 30 | Fill #2

## 2019-05-08 MED FILL — LANTUS 100 UNITS/ML VIAL: 100 | 30 days supply | Qty: 30 | Fill #2

## 2019-05-13 ENCOUNTER — Other Ambulatory Visit: Payer: Self-pay

## 2019-05-13 ENCOUNTER — Ambulatory Visit: Payer: BLUE CROSS/BLUE SHIELD | Attending: Family Medicine | Admitting: Family Medicine

## 2019-05-13 DIAGNOSIS — Z794 Long term (current) use of insulin: Secondary | ICD-10-CM

## 2019-05-13 DIAGNOSIS — I10 Essential (primary) hypertension: Secondary | ICD-10-CM

## 2019-05-13 DIAGNOSIS — M62838 Other muscle spasm: Secondary | ICD-10-CM

## 2019-05-13 DIAGNOSIS — E1142 Type 2 diabetes mellitus with diabetic polyneuropathy: Secondary | ICD-10-CM | POA: Diagnosis not present

## 2019-05-13 DIAGNOSIS — Z975 Presence of (intrauterine) contraceptive device: Secondary | ICD-10-CM

## 2019-05-13 MED ORDER — CYCLOBENZAPRINE HCL 10 MG PO TABS: 10.0000 mg | ORAL_TABLET | Freq: Two times a day (BID) | ORAL | 2 refills | Status: DC | PRN

## 2019-05-13 MED ORDER — CARVEDILOL 25 MG PO TABS: 25.0000 mg | ORAL_TABLET | Freq: Two times a day (BID) | ORAL | 1 refills | Status: DC

## 2019-05-13 NOTE — Progress Notes (Signed)
Patient has been called and DOB has been verified. Patient has been screened and transferred to PCP to start phone visit.     
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
## 2019-05-14 ENCOUNTER — Encounter: Payer: Self-pay | Admitting: Family Medicine

## 2019-05-14 MED FILL — CYCLOBENZAPRINE 10 MG TAB: 10 | 30 days supply | Qty: 60 | Fill #0

## 2019-05-20 MED FILL — CARVEDILOL 25 MG TABLET: 25 | 30 days supply | Qty: 60 | Fill #0

## 2019-05-22 MED FILL — SPIRONOLACTONE 25 MG TABLET: 25 | 30 days supply | Qty: 30 | Fill #3

## 2019-05-27 ENCOUNTER — Ambulatory Visit: Payer: Self-pay | Admitting: Family Medicine

## 2019-06-02 IMAGING — US US SOFT TISSUE HEAD/NECK
1 series · 13 of 25 positions shown · non-contrast
Comparison: None.

CLINICAL DATA: Palpable abnormality. Potential enlarged right lobe
of the thyroid.

EXAM:
THYROID ULTRASOUND
TECHNIQUE: Ultrasound examination of the thyroid gland and adjacent soft
tissues was performed.

[Series 1: us soft tissue head/neck · 0.04mm/px · 13 of 49 slices shown]
[im 1/49]
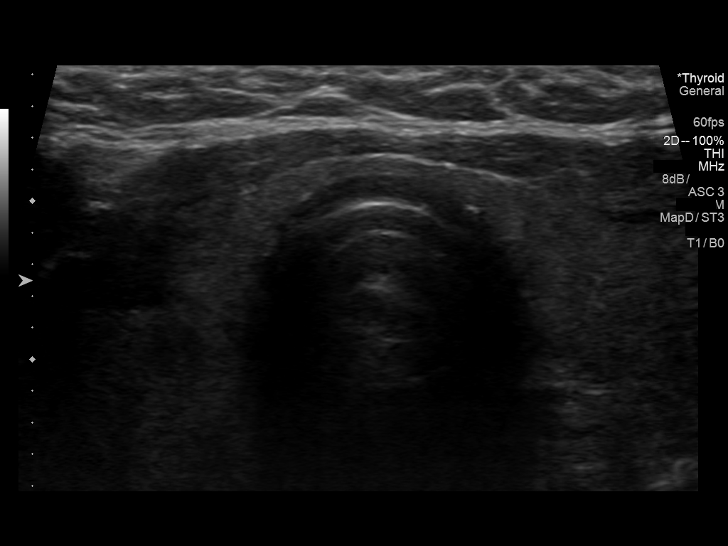
[im 5/49]
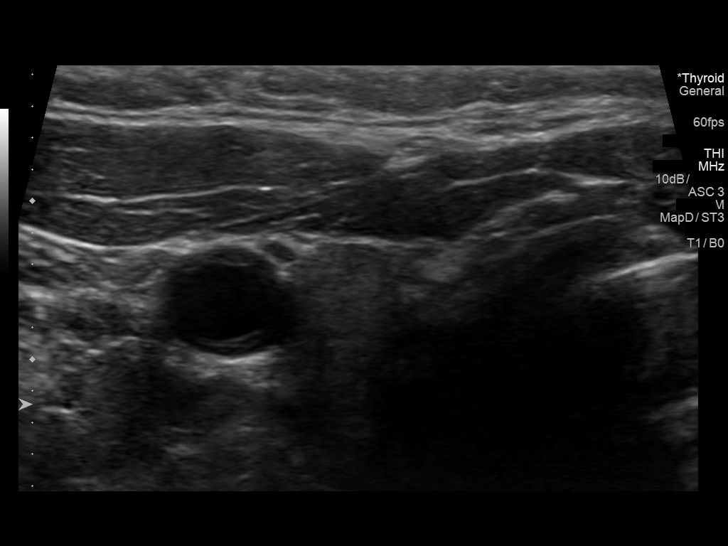
[im 9/49]
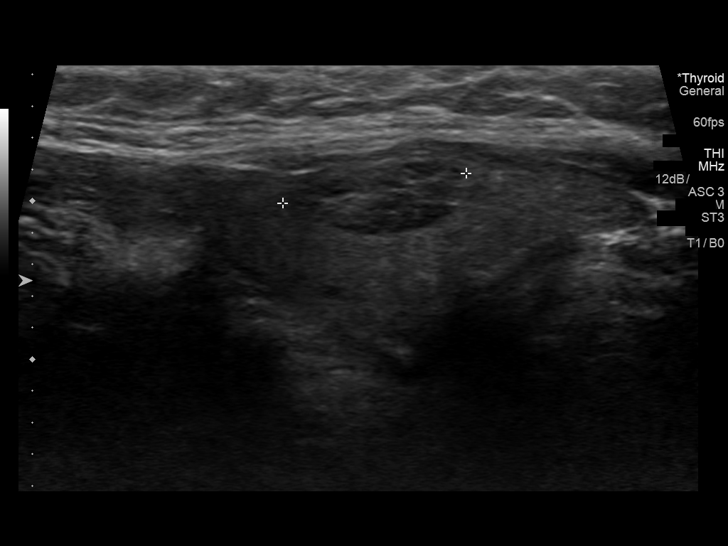
[im 13/49]
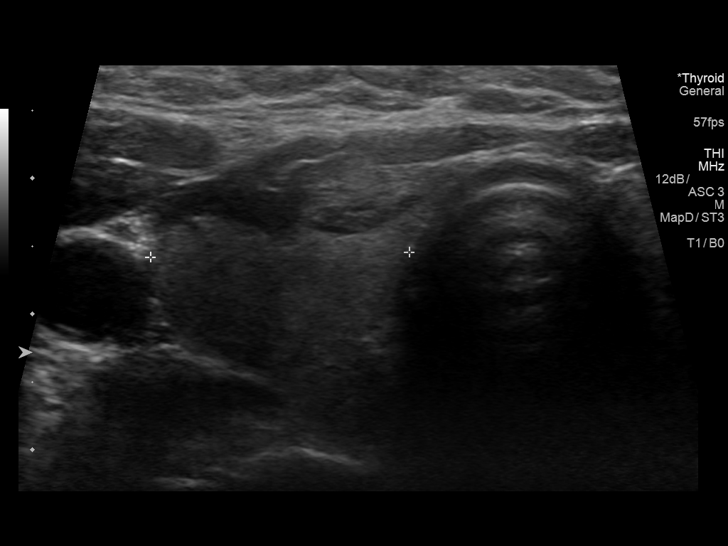
[im 17/49]
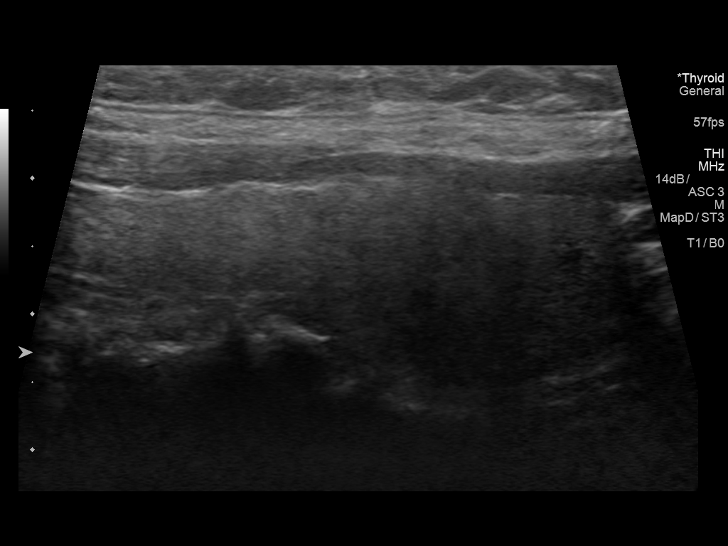
[im 21/49]
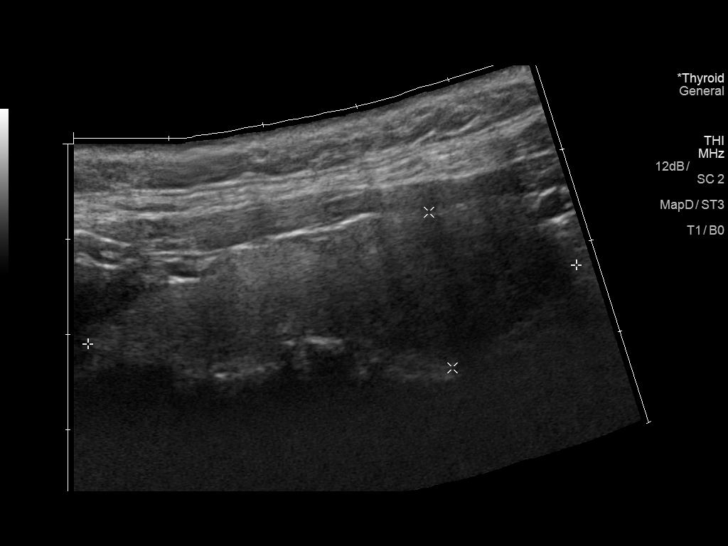
[im 25/49]
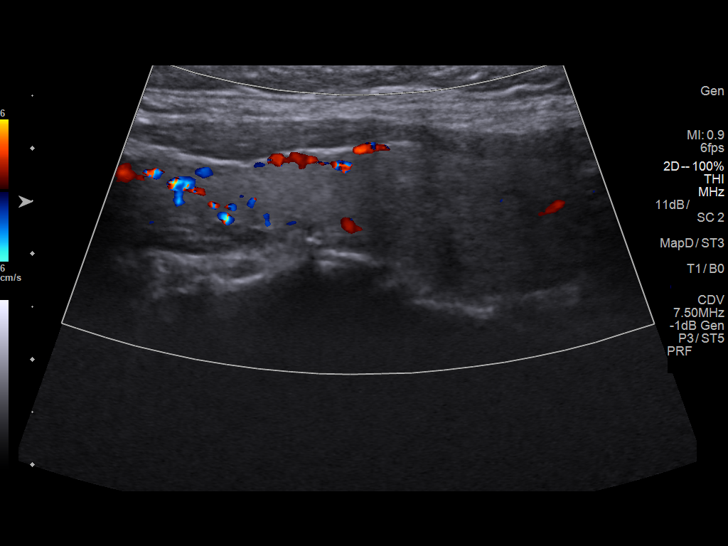
[im 29/49]
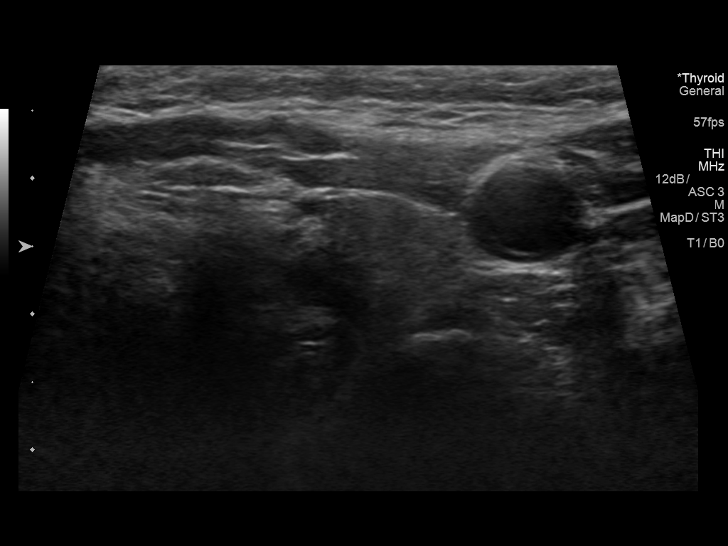
[im 33/49]
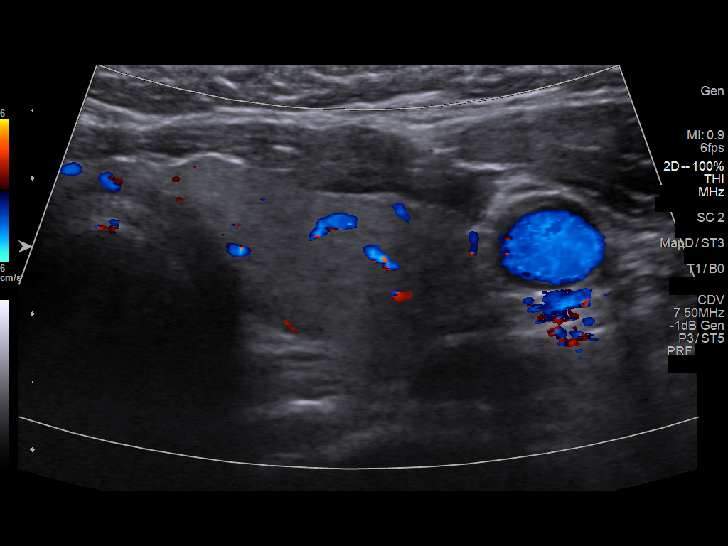
[im 37/49]
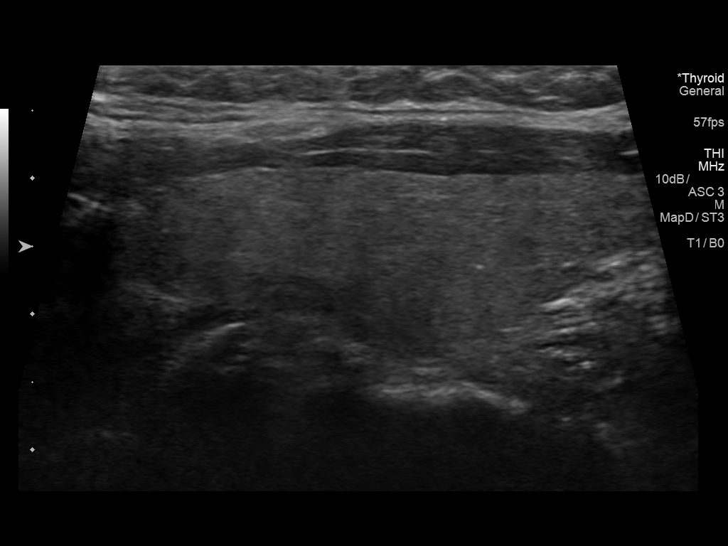
[im 41/49]
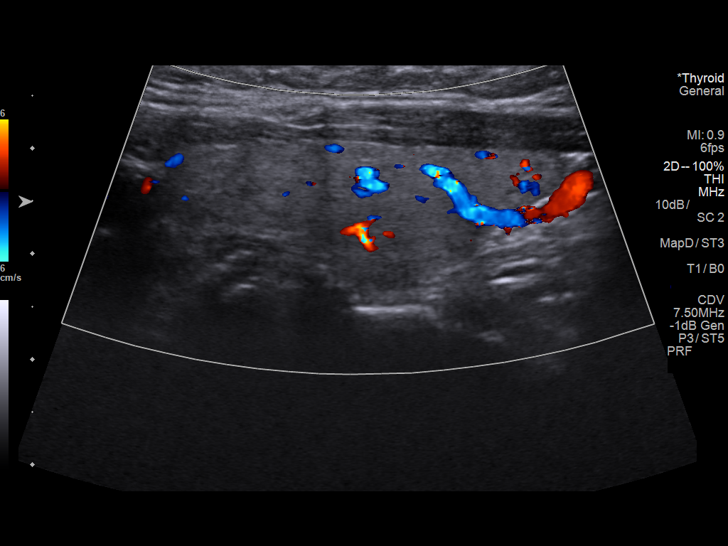
[im 45/49]
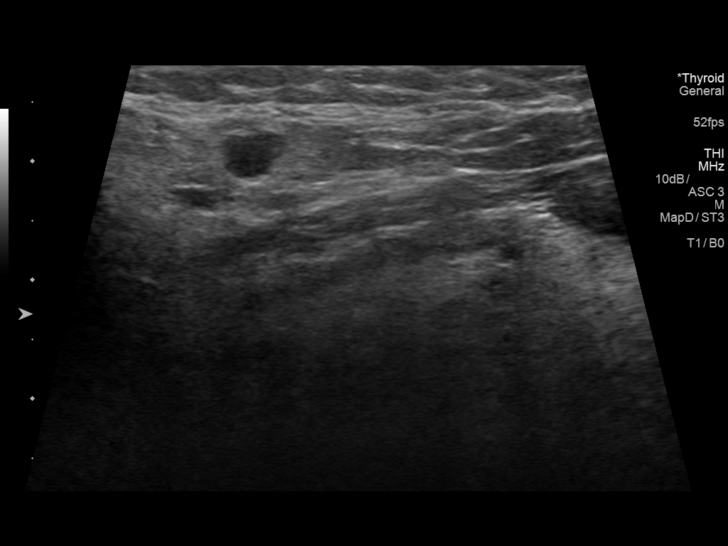
[im 49/49]
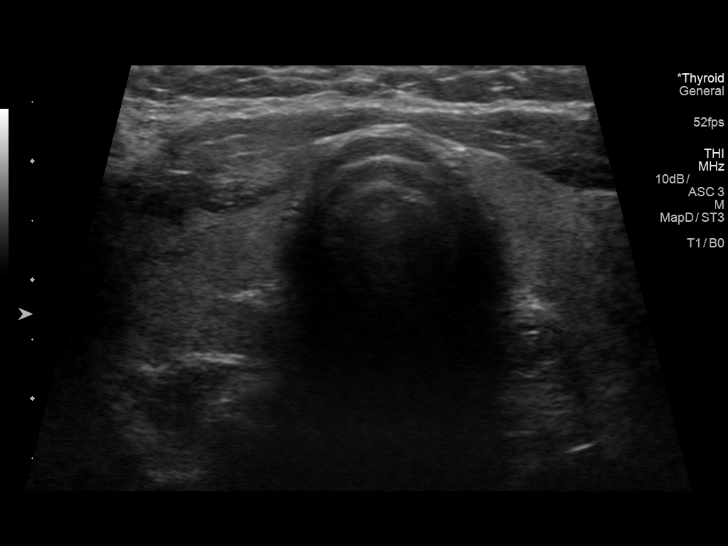

[13 of 25 positions shown; findings below may reference images not displayed]

FINDINGS: Parenchymal Echotexture: Normal

Isthmus: Normal in size measures 0.2 cm in diameter

Right lobe: Normal in size measuring 4.8 x 1.9 x 1.7 cm

Left lobe: Normal in size measuring 4.1 x 2.0 x 1.4 cm

_________________________________________________________

Estimated total number of nodules >/= 1 cm: 1

Number of spongiform nodules >/=  2 cm not described below (TR1): 0

Number of mixed cystic and solid nodules >/= 1.5 cm not described
below (TR2): 0

_________________________________________________________

Nodule # 1:

Location: Right; Mid

Maximum size: 1.0 cm; Other 2 dimensions: 1.0 x 0.5 cm

Composition: solid/almost completely solid (2) with spongiform
features

Echogenicity: isoechoic (1)

Shape: not taller-than-wide (0)

Margins: ill-defined (0)

Echogenic foci: none (0)

ACR TI-RADS total points: 3.

ACR TI-RADS risk category: TR3 (3 points).

ACR TI-RADS recommendations:

Given size (<1.4 cm) and appearance, this nodule does NOT meet
TI-RADS criteria for biopsy or dedicated follow-up.

_________________________________________________________
IMPRESSION: 1. Solitary punctate (approximately 1 cm) partially spongiform
appearing nodule with the mid, medial aspect the right lobe of the
thyroid does not meet imaging criteria to recommend percutaneous
sampling or dedicated follow-up.
2. Very mild enlargement of the right lobe of the thyroid in
comparison to the left - while still within normal limits, this
slight size difference may explain the palpable abnormality.
The above is in keeping with the ACR TI-RADS recommendations - [HOSPITAL] 1734;[DATE].

## 2019-06-04 MED FILL — BENAZEPRIL HCL 40 MG TABLET: 40 | 90 days supply | Qty: 90 | Fill #1

## 2019-06-04 MED FILL — OMEPRAZOLE 20 MG CAP: 20 | 90 days supply | Qty: 90 | Fill #1

## 2019-06-04 MED FILL — GLIMEPIRIDE 4 MG TABS: 4 | 30 days supply | Qty: 60 | Fill #1

## 2019-06-05 MED FILL — LANTUS 100 UNITS/ML VIAL: 100 | 30 days supply | Qty: 30 | Fill #3

## 2019-06-25 MED FILL — ?SPIRONOLACTONE 25 MG TABLE: 25 | 30 days supply | Qty: 30 | Fill #4

## 2019-06-27 MED FILL — HYDROCHLOROTHIAZIDE 25 MG T: 25 | 90 days supply | Qty: 90 | Fill #1

## 2019-07-01 ENCOUNTER — Ambulatory Visit: Payer: Self-pay | Attending: Family Medicine

## 2019-07-01 ENCOUNTER — Other Ambulatory Visit: Payer: Self-pay

## 2019-07-01 DIAGNOSIS — Z111 Encounter for screening for respiratory tuberculosis: Secondary | ICD-10-CM

## 2019-07-01 NOTE — Progress Notes (Signed)
PPD Placement note Kelly Santana, 59 y.o. female is here today for placement of PPD test Reason for PPD test: employment  Pt taken PPD test before: {yes as per pt statement / Denies any HX of previous positive PPD Verified in allergy area and with patient that they are not allergic to the products PPD is made of (Phenol or Tween). YES Has the patient ever received the BCG vaccine?:Unknown Has the patient been in recent contact with anyone known or suspected of having active TB disease?: NO      O: Alert and oriented in NAD.with no signs of acute febrile illnesses.  P:  PPD placed on 07/01/2019. Well tolerated. Education given  Patient advised to return for reading within 48-72 hours.Appt scheduled /

## 2019-07-03 ENCOUNTER — Ambulatory Visit: Payer: Self-pay | Attending: Family Medicine

## 2019-07-03 ENCOUNTER — Ambulatory Visit: Payer: Self-pay

## 2019-07-03 ENCOUNTER — Other Ambulatory Visit: Payer: Self-pay

## 2019-07-03 DIAGNOSIS — Z111 Encounter for screening for respiratory tuberculosis: Secondary | ICD-10-CM

## 2019-07-03 LAB — TB SKIN TEST
Induration: 0 mm
TB Skin Test: NEGATIVE

## 2019-07-03 NOTE — Progress Notes (Signed)
PPD Reading Note PPD read and results entered in [employer]. Result: 0 mm induration. Interpretation: NEGATIVE If test not read within 48-72 hours of initial placement, patient advised to repeat in other arm 1-3 weeks after this test. Allergic reaction:NONE   Letter provided today to patient

## 2019-07-10 ENCOUNTER — Other Ambulatory Visit: Payer: Self-pay

## 2019-07-10 ENCOUNTER — Encounter (HOSPITAL_COMMUNITY): Payer: Self-pay

## 2019-07-10 ENCOUNTER — Ambulatory Visit: Payer: BLUE CROSS/BLUE SHIELD

## 2019-07-10 ENCOUNTER — Ambulatory Visit (HOSPITAL_COMMUNITY)
Admission: EM | Admit: 2019-07-10 | Discharge: 2019-07-10 | Disposition: A | Discharge status: Home / Self Care | Payer: Self-pay | Attending: Emergency Medicine | Admitting: Emergency Medicine

## 2019-07-10 DIAGNOSIS — J208 Acute bronchitis due to other specified organisms: Secondary | ICD-10-CM

## 2019-07-10 DIAGNOSIS — Z1152 Encounter for screening for COVID-19: Secondary | ICD-10-CM

## 2019-07-10 MED ORDER — BENZONATATE 100 MG PO CAPS: 100.0000 mg | ORAL_CAPSULE | Freq: Three times a day (TID) | ORAL | 0 refills | Status: DC

## 2019-07-10 MED ORDER — AZITHROMYCIN 250 MG PO TABS: 250.0000 mg | ORAL_TABLET | Freq: Every day | ORAL | 0 refills | Status: DC

## 2019-07-10 MED ORDER — PREDNISONE 10 MG (21) PO TBPK: ORAL_TABLET | Freq: Every day | ORAL | 0 refills | Status: DC

## 2019-07-10 NOTE — Discharge Instructions (Signed)
COVID testing ordered.  It will take between 2-7 days for test results.  Someone will contact you regarding abnormal results.    In the meantime: You should remain isolated in your home for 10 days from symptom onset AND greater than 24 hours after symptoms resolution (absence of fever without the use of fever-reducing medication and improvement in respiratory symptoms), whichever is longer Get plenty of rest and push fluids Tessalon Perles prescribed for cough Azithromycin prescribed for possible bronchitis Prednisone were prescribed Use medications daily for symptom relief Use OTC medications like ibuprofen or tylenol as needed fever or pain Call or go to the ED if you have any new or worsening symptoms such as fever, worsening cough, shortness of breath, chest tightness, chest pain, turning blue, changes in mental status, etc..Marland Kitchen

## 2019-07-10 NOTE — ED Triage Notes (Signed)
Patient complains of coughing, sneezing, hoarseness, vomiting from coughing so hard.
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
## 2019-07-15 ENCOUNTER — Ambulatory Visit: Payer: Self-pay

## 2019-07-18 ENCOUNTER — Other Ambulatory Visit: Payer: Self-pay | Admitting: Family Medicine

## 2019-07-18 MED FILL — ?SPIRONOLACTONE 25 MG TABLE: 25 | 30 days supply | Qty: 30 | Fill #5

## 2019-07-18 MED FILL — ?CARVEDILOL 25 MG TABLET: 25 | 30 days supply | Qty: 60 | Fill #1

## 2019-07-18 MED FILL — GLIMEPIRIDE 4 MG TABS: 4 | 30 days supply | Qty: 60 | Fill #2

## 2019-07-18 MED FILL — LANTUS 100 UNITS/ML VIAL: 100 | 30 days supply | Qty: 30 | Fill #4

## 2019-07-19 MED FILL — FLUCONAZOLE 150 MG TABLET: 150 | 1 days supply | Qty: 1 | Fill #0

## 2019-07-24 ENCOUNTER — Ambulatory Visit: Payer: Self-pay

## 2019-07-25 ENCOUNTER — Telehealth (HOSPITAL_COMMUNITY): Payer: Self-pay | Admitting: Orthopedic Surgery

## 2019-08-12 ENCOUNTER — Ambulatory Visit: Payer: Self-pay | Attending: Family Medicine

## 2019-08-12 ENCOUNTER — Other Ambulatory Visit: Payer: Self-pay

## 2019-08-19 ENCOUNTER — Other Ambulatory Visit: Payer: Self-pay

## 2019-08-19 ENCOUNTER — Ambulatory Visit: Payer: Self-pay | Attending: Family Medicine

## 2019-08-26 ENCOUNTER — Other Ambulatory Visit: Payer: Self-pay | Admitting: Family Medicine

## 2019-08-26 DIAGNOSIS — I10 Essential (primary) hypertension: Secondary | ICD-10-CM

## 2019-08-26 MED FILL — GLIMEPIRIDE 4 MG TABS: 4 | 30 days supply | Qty: 60 | Fill #3

## 2019-08-26 MED FILL — LANTUS 100 UNITS/ML VIAL: 100 | 30 days supply | Qty: 30 | Fill #5

## 2019-08-26 MED FILL — GABAPENTIN 300 MG CAPSULE: 300 | 30 days supply | Qty: 120 | Fill #0

## 2019-08-27 ENCOUNTER — Other Ambulatory Visit: Payer: Self-pay

## 2019-08-27 ENCOUNTER — Ambulatory Visit: Payer: Self-pay | Admitting: Family Medicine

## 2019-08-27 ENCOUNTER — Other Ambulatory Visit: Payer: Self-pay | Admitting: Family Medicine

## 2019-08-27 ENCOUNTER — Encounter: Payer: Self-pay | Admitting: Family Medicine

## 2019-08-27 VITALS — BP 134/78 | HR 80 | Ht 64.0 in | Wt 186.0 lb

## 2019-08-27 DIAGNOSIS — Z794 Long term (current) use of insulin: Secondary | ICD-10-CM

## 2019-08-27 DIAGNOSIS — E7800 Pure hypercholesterolemia, unspecified: Secondary | ICD-10-CM

## 2019-08-27 DIAGNOSIS — E118 Type 2 diabetes mellitus with unspecified complications: Secondary | ICD-10-CM

## 2019-08-27 DIAGNOSIS — G4709 Other insomnia: Secondary | ICD-10-CM

## 2019-08-27 DIAGNOSIS — K257 Chronic gastric ulcer without hemorrhage or perforation: Secondary | ICD-10-CM

## 2019-08-27 DIAGNOSIS — E1142 Type 2 diabetes mellitus with diabetic polyneuropathy: Secondary | ICD-10-CM

## 2019-08-27 DIAGNOSIS — E876 Hypokalemia: Secondary | ICD-10-CM

## 2019-08-27 DIAGNOSIS — J4520 Mild intermittent asthma, uncomplicated: Secondary | ICD-10-CM

## 2019-08-27 DIAGNOSIS — M62838 Other muscle spasm: Secondary | ICD-10-CM

## 2019-08-27 DIAGNOSIS — I10 Essential (primary) hypertension: Secondary | ICD-10-CM

## 2019-08-27 LAB — POCT GLYCOSYLATED HEMOGLOBIN (HGB A1C): HbA1c, POC (controlled diabetic range): 7.4 % — AB (ref 0.0–7.0)

## 2019-08-27 LAB — GLUCOSE, POCT (MANUAL RESULT ENTRY): POC Glucose: 214 mg/dl — AB (ref 70–99)

## 2019-08-27 MED ORDER — INSULIN GLARGINE 100 UNIT/ML ~~LOC~~ SOLN: 60.0000 [IU] | Freq: Two times a day (BID) | SUBCUTANEOUS | 6 refills | Status: DC

## 2019-08-27 MED ORDER — GABAPENTIN 300 MG PO CAPS: 600.0000 mg | ORAL_CAPSULE | Freq: Two times a day (BID) | ORAL | 1 refills | Status: DC

## 2019-08-27 MED ORDER — TRAZODONE HCL 100 MG PO TABS: 50.0000 mg | ORAL_TABLET | Freq: Every day | ORAL | 1 refills | Status: DC

## 2019-08-27 MED ORDER — SIMVASTATIN 10 MG PO TABS: 10.0000 mg | ORAL_TABLET | Freq: Every day | ORAL | 1 refills | Status: DC

## 2019-08-27 MED ORDER — POTASSIUM CHLORIDE CRYS ER 20 MEQ PO TBCR: 20.0000 meq | EXTENDED_RELEASE_TABLET | Freq: Every day | ORAL | 1 refills | Status: DC

## 2019-08-27 MED ORDER — OMEPRAZOLE 20 MG PO CPDR: 20.0000 mg | DELAYED_RELEASE_CAPSULE | Freq: Every day | ORAL | 1 refills | Status: DC

## 2019-08-27 MED ORDER — INSULIN LISPRO 100 UNIT/ML ~~LOC~~ SOLN: 0.0000 [IU] | Freq: Three times a day (TID) | SUBCUTANEOUS | 6 refills | Status: DC

## 2019-08-27 MED ORDER — CARVEDILOL 25 MG PO TABS: 25.0000 mg | ORAL_TABLET | Freq: Two times a day (BID) | ORAL | 1 refills | Status: DC

## 2019-08-27 MED ORDER — GLIMEPIRIDE 4 MG PO TABS: 4.0000 mg | ORAL_TABLET | Freq: Two times a day (BID) | ORAL | 1 refills | Status: DC

## 2019-08-27 MED ORDER — HYDROCHLOROTHIAZIDE 25 MG PO TABS: 25.0000 mg | ORAL_TABLET | Freq: Every day | ORAL | 1 refills | Status: DC

## 2019-08-27 MED ORDER — BENAZEPRIL HCL 40 MG PO TABS: 40.0000 mg | ORAL_TABLET | Freq: Every day | ORAL | 1 refills | Status: DC

## 2019-08-27 MED ORDER — SPIRONOLACTONE 25 MG PO TABS: 25.0000 mg | ORAL_TABLET | Freq: Every day | ORAL | 1 refills | Status: DC

## 2019-08-27 MED ORDER — DULERA 100-5 MCG/ACT IN AERO: 2.0000 | INHALATION_SPRAY | Freq: Two times a day (BID) | RESPIRATORY_TRACT | 3 refills | Status: DC

## 2019-08-27 MED FILL — ?SPIRONOLACTONE 25 MG TABLE: 25 | 30 days supply | Qty: 30 | Fill #0

## 2019-08-27 NOTE — Progress Notes (Signed)
Has been having cramping in legs and feet.
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
## 2019-08-27 NOTE — Patient Instructions (Signed)
Leg Cramps Leg cramps occur when one or more muscles tighten and you have no control over this tightening (involuntary muscle contraction). Muscle cramps can develop in any muscle, but the most common place is in the calf muscles of the leg. Those cramps can occur during exercise or when you are at rest. Leg cramps are painful, and they may last for a few seconds to a few minutes. Cramps may return several times before they finally stop. Usually, leg cramps are not caused by a serious medical problem. In many cases, the cause is not known. Some common causes include:  Excessive physical effort (overexertion), such as during intense exercise.  Overuse from repetitive motions, or doing the same thing over and over.  Staying in a certain position for a long period of time.  Improper preparation, form, or technique while performing a sport or an activity.  Dehydration.  Injury.  Side effects of certain medicines.  Abnormally low levels of minerals in your blood (electrolytes), especially potassium and calcium. This could result from: ? Pregnancy. ? Taking diuretic medicines. Follow these instructions at home: Eating and drinking  Drink enough fluid to keep your urine pale yellow. Staying hydrated may help prevent cramps.  Eat a healthy diet that includes plenty of nutrients to help your muscles function. A healthy diet includes fruits and vegetables, lean protein, whole grains, and low-fat or nonfat dairy products. Managing pain, stiffness, and swelling      Try massaging, stretching, and relaxing the affected muscle. Do this for several minutes at a time.  If directed, put ice on areas that are sore or painful after a cramp: ? Put ice in a plastic bag. ? Place a towel between your skin and the bag. ? Leave the ice on for 20 minutes, 2-3 times a day.  If directed, apply heat to muscles that are tense or tight. Do this before you exercise, or as often as told by your health care  provider. Use the heat source that your health care provider recommends, such as a moist heat pack or a heating pad. ? Place a towel between your skin and the heat source. ? Leave the heat on for 20-30 minutes. ? Remove the heat if your skin turns bright red. This is especially important if you are unable to feel pain, heat, or cold. You may have a greater risk of getting burned.  Try taking hot showers or baths to help relax tight muscles. General instructions  If you are having frequent leg cramps, avoid intense exercise for several days.  Take over-the-counter and prescription medicines only as told by your health care provider.  Keep all follow-up visits as told by your health care provider. This is important. Contact a health care provider if:  Your leg cramps get more severe or more frequent, or they do not improve over time.  Your foot becomes cold, numb, or blue. Summary  Muscle cramps can develop in any muscle, but the most common place is in the calf muscles of the leg.  Leg cramps are painful, and they may last for a few seconds to a few minutes.  Usually, leg cramps are not caused by a serious medical problem. Often, the cause is not known.  Stay hydrated and take over-the-counter and prescription medicines only as told by your health care provider. This information is not intended to replace advice given to you by your health care provider. Make sure you discuss any questions you have with your health care   provider. Document Revised: 01/27/2017 Document Reviewed: 11/24/2016 Elsevier Patient Education  2020 [employer]  

## 2019-08-28 ENCOUNTER — Other Ambulatory Visit: Payer: Self-pay | Admitting: Family Medicine

## 2019-08-28 DIAGNOSIS — K257 Chronic gastric ulcer without hemorrhage or perforation: Secondary | ICD-10-CM

## 2019-08-28 DIAGNOSIS — E876 Hypokalemia: Secondary | ICD-10-CM

## 2019-08-28 LAB — BASIC METABOLIC PANEL
BUN/Creatinine Ratio: 9 (ref 9–23)
BUN: 10 mg/dL (ref 6–24)
CO2: 30 mmol/L — ABNORMAL HIGH (ref 20–29)
Calcium: 9.1 mg/dL (ref 8.7–10.2)
Chloride: 96 mmol/L (ref 96–106)
Creatinine, Ser: 1.08 mg/dL — ABNORMAL HIGH (ref 0.57–1.00)
GFR calc Af Amer: 66 mL/min/{1.73_m2} (ref >59)
GFR calc non Af Amer: 57 mL/min/{1.73_m2} — ABNORMAL LOW (ref >59)
Glucose: 190 mg/dL — ABNORMAL HIGH (ref 65–99)
Potassium: 3.3 mmol/L — ABNORMAL LOW (ref 3.5–5.2)
Sodium: 140 mmol/L (ref 134–144)

## 2019-08-28 MED ORDER — POTASSIUM CHLORIDE CRYS ER 20 MEQ PO TBCR: 30.0000 meq | EXTENDED_RELEASE_TABLET | Freq: Every day | ORAL | 1 refills | Status: DC

## 2019-08-28 MED FILL — VENTOLIN HFA 90 MCG INHALER: 108 (90 BAS | 25 days supply | Qty: 18 | Fill #2

## 2019-08-28 MED FILL — DULERA 100 MCG/5 MCG INH: 100-5 | 30 days supply | Qty: 13 | Fill #1

## 2019-08-28 MED FILL — POTASSIUM CL ER 20 MEQ TABL: 20 | 30 days supply | Qty: 45 | Fill #0

## 2019-08-29 ENCOUNTER — Telehealth: Payer: Self-pay

## 2019-08-29 NOTE — Telephone Encounter (Signed)
Patient name and DOB has been verified Patient was informed of lab results. Patient had no questions.  

## 2019-08-29 NOTE — Telephone Encounter (Signed)
-----   Message from Hoy Register, MD sent at 08/28/2019 12:36 PM EDT ----- Can you please ensure she has reviewed my chart result note? Thanks

## 2019-09-04 ENCOUNTER — Other Ambulatory Visit: Payer: Self-pay

## 2019-09-04 ENCOUNTER — Emergency Department (HOSPITAL_COMMUNITY)
Admission: EM | Admit: 2019-09-04 | Discharge: 2019-09-04 | Disposition: A | Discharge status: Home / Self Care | Payer: Self-pay | Attending: Emergency Medicine | Admitting: Emergency Medicine

## 2019-09-04 ENCOUNTER — Encounter (HOSPITAL_COMMUNITY): Payer: Self-pay | Admitting: Emergency Medicine

## 2019-09-04 DIAGNOSIS — R112 Nausea with vomiting, unspecified: Secondary | ICD-10-CM | POA: Insufficient documentation

## 2019-09-04 DIAGNOSIS — Z5321 Procedure and treatment not carried out due to patient leaving prior to being seen by health care provider: Secondary | ICD-10-CM | POA: Insufficient documentation

## 2019-09-04 DIAGNOSIS — R109 Unspecified abdominal pain: Secondary | ICD-10-CM | POA: Insufficient documentation

## 2019-09-04 DIAGNOSIS — F419 Anxiety disorder, unspecified: Secondary | ICD-10-CM | POA: Insufficient documentation

## 2019-09-04 LAB — I-STAT BETA HCG BLOOD, ED (MC, WL, AP ONLY): I-stat hCG, quantitative: <5 m[IU]/mL (ref <5)

## 2019-09-04 LAB — CBC
HCT: 46.7 % — ABNORMAL HIGH (ref 36.0–46.0)
Hemoglobin: 15.7 g/dL — ABNORMAL HIGH (ref 12.0–15.0)
MCH: 32.9 pg (ref 26.0–34.0)
MCHC: 33.6 g/dL (ref 30.0–36.0)
MCV: 97.9 fL (ref 80.0–100.0)
Platelets: 266 10*3/uL (ref 150–400)
RBC: 4.77 MIL/uL (ref 3.87–5.11)
RDW: 12.4 % (ref 11.5–15.5)
WBC: 6.8 10*3/uL (ref 4.0–10.5)
nRBC: 0 % (ref 0.0–0.2)

## 2019-09-04 LAB — URINALYSIS, ROUTINE W REFLEX MICROSCOPIC
Bacteria, UA: NONE SEEN
Bilirubin Urine: NEGATIVE
Glucose, UA: NEGATIVE mg/dL
Ketones, ur: 5 mg/dL — AB
Nitrite: NEGATIVE
Protein, ur: 100 mg/dL — AB
Specific Gravity, Urine: 1.025 (ref 1.005–1.030)
pH: 5 (ref 5.0–8.0)

## 2019-09-04 LAB — COMPREHENSIVE METABOLIC PANEL
ALT: 16 U/L (ref 0–44)
AST: 21 U/L (ref 15–41)
Albumin: 4 g/dL (ref 3.5–5.0)
Alkaline Phosphatase: 50 U/L (ref 38–126)
Anion gap: 13 (ref 5–15)
BUN: 11 mg/dL (ref 6–20)
CO2: 26 mmol/L (ref 22–32)
Calcium: 8.8 mg/dL — ABNORMAL LOW (ref 8.9–10.3)
Chloride: 100 mmol/L (ref 98–111)
Creatinine, Ser: 1.06 mg/dL — ABNORMAL HIGH (ref 0.44–1.00)
GFR calc Af Amer: >60 mL/min (ref >60)
GFR calc non Af Amer: 58 mL/min — ABNORMAL LOW (ref >60)
Glucose, Bld: 164 mg/dL — ABNORMAL HIGH (ref 70–99)
Potassium: 3.2 mmol/L — ABNORMAL LOW (ref 3.5–5.1)
Sodium: 139 mmol/L (ref 135–145)
Total Bilirubin: 1 mg/dL (ref 0.3–1.2)
Total Protein: 7.4 g/dL (ref 6.5–8.1)

## 2019-09-04 LAB — LIPASE, BLOOD: Lipase: 44 U/L (ref 11–51)

## 2019-09-04 MED ORDER — SODIUM CHLORIDE 0.9% FLUSH: 3.0000 mL | Freq: Once | INTRAVENOUS | Status: DC

## 2019-09-04 NOTE — ED Triage Notes (Signed)
Pt c/o abdominal pain with nausea/vomiting x 1 day. Also reports anxiety and increased stress.

## 2019-09-04 NOTE — ED Notes (Signed)
Patient left without being seen.

## 2019-09-09 MED FILL — ?OMEPRAZOLE 20 MG CPDR: 20 | 30 days supply | Qty: 30 | Fill #0

## 2019-09-09 MED FILL — BENAZEPRIL HCL 40 MG TABLET: 40 | 30 days supply | Qty: 30 | Fill #0

## 2019-09-10 MED FILL — ?CARVEDILOL 25 MG TABLET: 25 | 30 days supply | Qty: 60 | Fill #2

## 2019-09-13 MED FILL — SIMVASTATIN 10 MG TABLET: 10 | 30 days supply | Qty: 30 | Fill #1

## 2019-09-20 MED FILL — ?SPIRONOLACTONE 25 MG TABLE: 25 | 30 days supply | Qty: 30 | Fill #1

## 2019-09-25 MED FILL — POTASSIUM CL ER 20 MEQ TABL: 20 | 30 days supply | Qty: 45 | Fill #1

## 2019-09-27 MED FILL — $LANTUS 100 UNITS/ML VIAL: 100 | 30 days supply | Qty: 30 | Fill #6

## 2019-09-27 MED FILL — HYDROCHLOROTHIAZIDE 25 MG T: 25 | 30 days supply | Qty: 30 | Fill #0

## 2019-10-10 ENCOUNTER — Other Ambulatory Visit: Payer: Self-pay | Admitting: Family Medicine

## 2019-10-10 DIAGNOSIS — J4520 Mild intermittent asthma, uncomplicated: Secondary | ICD-10-CM

## 2019-10-10 MED FILL — ?CARVEDILOL 25 MG TABLET: 25 | 30 days supply | Qty: 60 | Fill #3

## 2019-10-10 MED FILL — BENAZEPRIL HCL 40 MG TABLET: 40 | 30 days supply | Qty: 30 | Fill #1

## 2019-10-10 MED FILL — ?OMEPRAZOLE 20 MG CPDR: 20 | 30 days supply | Qty: 30 | Fill #1

## 2019-10-10 MED FILL — SIMVASTATIN 10 MG TABLET: 10 | 30 days supply | Qty: 30 | Fill #2

## 2019-10-10 MED FILL — DULERA 100 MCG/5 MCG INH: 100-5 | 30 days supply | Qty: 13 | Fill #2

## 2019-10-10 NOTE — Telephone Encounter (Signed)
Requested Prescriptions  Pending Prescriptions Disp Refills  . VENTOLIN HFA 108 (90 Base) MCG/ACT inhaler [Pharmacy Med Name: VENTOLIN HFA 90 MCG INHALER 108 (90 BAS Aerosol] 18 g 2    Sig: INHALE 1 TO 2 PUFFS INTO THE LUNGS EVERY 6 HOURS AS NEEDED FOR WHEEZING OR SHORTNESS OF BREATH.     Pulmonology:  Beta Agonists Failed - 10/10/2019  9:21 AM      Failed - One inhaler should last at least one month. If the patient is requesting refills earlier, contact the patient to check for uncontrolled symptoms.      Passed - Valid encounter within last 12 months    Recent Outpatient Visits          1 month ago Type 2 diabetes mellitus with diabetic polyneuropathy, with long-term current use of insulin (HCC)   [facility] [place], [place], [place]   5 months ago Type 2 diabetes mellitus with diabetic polyneuropathy, with long-term current use of insulin (HCC)   [facility] [place], [place], MD   7 months ago Type 2 diabetes mellitus with diabetic polyneuropathy, with long-term current use of insulin (HCC)   [facility] [place], [place], MD   10 months ago Type 2 diabetes mellitus with diabetic polyneuropathy, with long-term current use of insulin (HCC)   [facility] Hoy Register, MD   11 months ago Screening for cervical cancer    [facility] [place], [place]ette [place], MD      Future Appointments            In 1 month Hoy Register, MD [facility]

## 2019-10-21 MED FILL — ?SPIRONOLACTONE 25 MG TABLE: 25 | 30 days supply | Qty: 30 | Fill #2

## 2019-10-21 MED FILL — POTASSIUM CL ER 20 MEQ TAB: 20 | 30 days supply | Qty: 45 | Fill #2

## 2019-10-21 MED FILL — GLIMEPIRIDE 4 MG TABS: 4 | 30 days supply | Qty: 60 | Fill #4

## 2019-10-28 ENCOUNTER — Encounter (HOSPITAL_COMMUNITY): Payer: Self-pay | Admitting: Emergency Medicine

## 2019-10-28 ENCOUNTER — Ambulatory Visit (HOSPITAL_COMMUNITY)
Admission: EM | Admit: 2019-10-28 | Discharge: 2019-10-28 | Disposition: A | Discharge status: Home / Self Care | Payer: Self-pay | Attending: Family Medicine | Admitting: Family Medicine

## 2019-10-28 ENCOUNTER — Other Ambulatory Visit: Payer: Self-pay

## 2019-10-28 DIAGNOSIS — B3731 Acute candidiasis of vulva and vagina: Secondary | ICD-10-CM

## 2019-10-28 DIAGNOSIS — Z3202 Encounter for pregnancy test, result negative: Secondary | ICD-10-CM

## 2019-10-28 DIAGNOSIS — B373 Candidiasis of vulva and vagina: Secondary | ICD-10-CM | POA: Insufficient documentation

## 2019-10-28 DIAGNOSIS — Z20822 Contact with and (suspected) exposure to covid-19: Secondary | ICD-10-CM | POA: Insufficient documentation

## 2019-10-28 LAB — POC URINE PREG, ED: Preg Test, Ur: NEGATIVE

## 2019-10-28 LAB — POCT URINALYSIS DIPSTICK, ED / UC
Bilirubin Urine: NEGATIVE
Glucose, UA: NEGATIVE mg/dL
Ketones, ur: NEGATIVE mg/dL
Leukocytes,Ua: NEGATIVE
Nitrite: NEGATIVE
Protein, ur: 30 mg/dL — AB
Specific Gravity, Urine: >=1.03 (ref 1.005–1.030)
Urobilinogen, UA: 0.2 mg/dL (ref 0.0–1.0)
pH: 5.5 (ref 5.0–8.0)

## 2019-10-28 LAB — SARS CORONAVIRUS 2 (TAT 6-24 HRS): SARS Coronavirus 2: NEGATIVE

## 2019-10-28 MED ORDER — FLUCONAZOLE 150 MG PO TABS: 150.0000 mg | ORAL_TABLET | Freq: Every day | ORAL | 0 refills | Status: DC

## 2019-10-28 MED FILL — FLUCONAZOLE 150 MG TABLET: 150 | 3 days supply | Qty: 2 | Fill #0

## 2019-10-28 NOTE — ED Triage Notes (Signed)
Pt c/o her son tested positive for covid yesterday. Pt states yesterday she was nauseous and vomited a few times. She states she has not eaten today.   Pt states she has also had some vaginal discharge and she had some leftover metronidazole gel that she used x 5 days which did help.
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
## 2019-10-28 NOTE — Discharge Instructions (Signed)
Take medication for yeast as we discussed. Your covid results should be available in the next 24-48 hours on Mychart. If positive, you will need to isolate for 10 days.

## 2019-10-29 ENCOUNTER — Telehealth (HOSPITAL_COMMUNITY): Payer: Self-pay | Admitting: Emergency Medicine

## 2019-10-29 LAB — CERVICOVAGINAL ANCILLARY ONLY
Bacterial Vaginitis (gardnerella): POSITIVE — AB
Chlamydia: NEGATIVE
Comment: NEGATIVE
Comment: NEGATIVE
Comment: NEGATIVE
Comment: NORMAL
Neisseria Gonorrhea: NEGATIVE
Trichomonas: NEGATIVE

## 2019-10-29 MED ORDER — METRONIDAZOLE 500 MG PO TABS: 500.0000 mg | ORAL_TABLET | Freq: Two times a day (BID) | ORAL | 0 refills | Status: DC

## 2019-10-29 MED FILL — ?METRONIDAZOLE 500 MG TABS: 500 | 7 days supply | Qty: 14 | Fill #0

## 2019-11-07 MED FILL — SIMVASTATIN 10 MG TABLET: 10 | 30 days supply | Qty: 30 | Fill #3

## 2019-11-07 MED FILL — ?OMEPRAZOLE 20 MG CPDR: 20 | 30 days supply | Qty: 30 | Fill #2

## 2019-11-07 MED FILL — ?CARVEDILOL 25 MG TABLET: 25 | 30 days supply | Qty: 60 | Fill #4

## 2019-11-07 MED FILL — BENAZEPRIL HCL 40 MG TABLET: 40 | 30 days supply | Qty: 30 | Fill #2

## 2019-11-07 MED FILL — $LANTUS 100 UNITS/ML VIAL: 100 | 25 days supply | Qty: 30 | Fill #0

## 2019-11-15 ENCOUNTER — Ambulatory Visit: Payer: Self-pay | Admitting: Family

## 2019-11-15 MED FILL — ?SPIRONOLACTONE 25 MG TABLE: 25 | 30 days supply | Qty: 30 | Fill #3

## 2019-11-15 MED FILL — HYDROCHLOROTHIAZIDE 25 MG T: 25 | 30 days supply | Qty: 30 | Fill #1

## 2019-11-18 ENCOUNTER — Telehealth (INDEPENDENT_AMBULATORY_CARE_PROVIDER_SITE_OTHER): Payer: Self-pay | Admitting: Family Medicine

## 2019-11-18 NOTE — Telephone Encounter (Signed)
Copied from CRM [id number]. Topic: Quick Communication - See Telephone Encounter >> Nov 18, 2019 12:18 PM Aretta Nip wrote: CRM for notification. See Telephone encounter for: 11/18/19. PT wants permission from Newlin  to  do virtual for appt on [phone] States she might be moving. Thiought appt was today, when questioned still wanted a vitual said needs meds refill

## 2019-11-18 NOTE — Telephone Encounter (Signed)
Patient was called and a voicemail was left informing patient that appointment has been marked as virtual.

## 2019-11-27 ENCOUNTER — Ambulatory Visit: Payer: Self-pay | Attending: Family Medicine | Admitting: Family Medicine

## 2019-11-27 DIAGNOSIS — Z794 Long term (current) use of insulin: Secondary | ICD-10-CM

## 2019-11-27 DIAGNOSIS — K3184 Gastroparesis: Secondary | ICD-10-CM

## 2019-11-27 DIAGNOSIS — E118 Type 2 diabetes mellitus with unspecified complications: Secondary | ICD-10-CM

## 2019-11-27 DIAGNOSIS — E1143 Type 2 diabetes mellitus with diabetic autonomic (poly)neuropathy: Secondary | ICD-10-CM

## 2019-11-27 DIAGNOSIS — F411 Generalized anxiety disorder: Secondary | ICD-10-CM

## 2019-11-27 MED ORDER — METOCLOPRAMIDE HCL 10 MG PO TABS: 10.0000 mg | ORAL_TABLET | Freq: Three times a day (TID) | ORAL | 2 refills | Status: DC | PRN

## 2019-11-27 MED ORDER — HYDROXYZINE HCL 25 MG PO TABS: 25.0000 mg | ORAL_TABLET | Freq: Three times a day (TID) | ORAL | 1 refills | Status: DC | PRN
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
## 2019-11-27 NOTE — Progress Notes (Signed)
Sates that she has been having issues with anxiety.  Needs refill on nausea medication.

## 2019-11-28 MED FILL — METOCLOPRAMIDE 10 MG TABLET: 10 | 20 days supply | Qty: 60 | Fill #0

## 2019-11-28 MED FILL — hydrOXYzine HCL 25 MG TABS: 25 | 20 days supply | Qty: 60 | Fill #0

## 2019-11-29 MED FILL — POTASSIUM CL ER 20 MEQ TAB: 20 | 30 days supply | Qty: 45 | Fill #3

## 2019-12-03 ENCOUNTER — Ambulatory Visit: Payer: Self-pay | Attending: Family Medicine

## 2019-12-03 ENCOUNTER — Other Ambulatory Visit: Payer: Self-pay

## 2019-12-03 DIAGNOSIS — Z794 Long term (current) use of insulin: Secondary | ICD-10-CM

## 2019-12-03 DIAGNOSIS — Z23 Encounter for immunization: Secondary | ICD-10-CM

## 2019-12-03 MED FILL — GABAPENTIN 300 MG CAPSULE: 300 | 30 days supply | Qty: 120 | Fill #1

## 2019-12-03 MED FILL — ?OMEPRAZOLE 20 MG CPDR: 20 | 30 days supply | Qty: 30 | Fill #3

## 2019-12-03 MED FILL — BENAZEPRIL HCL 40 MG TABLET: 40 | 30 days supply | Qty: 30 | Fill #3

## 2019-12-04 LAB — BASIC METABOLIC PANEL
BUN/Creatinine Ratio: 14 (ref 9–23)
BUN: 14 mg/dL (ref 6–24)
CO2: 26 mmol/L (ref 20–29)
Calcium: 9.1 mg/dL (ref 8.7–10.2)
Chloride: 99 mmol/L (ref 96–106)
Creatinine, Ser: 0.99 mg/dL (ref 0.57–1.00)
GFR calc Af Amer: 73 mL/min/{1.73_m2} (ref >59)
GFR calc non Af Amer: 63 mL/min/{1.73_m2} (ref >59)
Glucose: 175 mg/dL — ABNORMAL HIGH (ref 65–99)
Potassium: 3.7 mmol/L (ref 3.5–5.2)
Sodium: 139 mmol/L (ref 134–144)

## 2019-12-04 LAB — HEMOGLOBIN A1C
Est. average glucose Bld gHb Est-mCnc: 197 mg/dL
Hgb A1c MFr Bld: 8.5 % — ABNORMAL HIGH (ref 4.8–5.6)

## 2019-12-09 MED FILL — ?SPIRONOLACTONE 25 MG TABLE: 25 | 30 days supply | Qty: 30 | Fill #4

## 2019-12-10 MED FILL — GLIMEPIRIDE 4 MG TABS: 4 | 30 days supply | Qty: 60 | Fill #5

## 2019-12-25 MED FILL — HYDROCHLOROTHIAZIDE 25 MG T: 25 | 30 days supply | Qty: 30 | Fill #2

## 2019-12-25 MED FILL — $LANTUS 100 UNITS/ML VIAL: 100 | 25 days supply | Qty: 30 | Fill #1

## 2019-12-27 MED FILL — POTASSIUM CL ER 20 MEQ TAB: 20 | 30 days supply | Qty: 45 | Fill #4

## 2019-12-30 MED FILL — BENAZEPRIL HCL 40 MG TABLET: 40 | 30 days supply | Qty: 30 | Fill #4

## 2019-12-30 MED FILL — ?OMEPRAZOLE 20 MG CPDR: 20 | 30 days supply | Qty: 30 | Fill #4

## 2020-01-15 MED FILL — ?SPIRONOLACTONE 25 MG TABLE: 25 | 30 days supply | Qty: 30 | Fill #5

## 2020-01-27 MED FILL — GLIMEPIRIDE 4 MG TABS: 4 | 30 days supply | Qty: 60 | Fill #0

## 2020-01-27 MED FILL — POTASSIUM CL ER 20 MEQ TAB: 20 | 30 days supply | Qty: 45 | Fill #5

## 2020-01-27 MED FILL — $LANTUS 100 UNITS/ML VIAL: 100 | 25 days supply | Qty: 30 | Fill #2

## 2020-01-27 MED FILL — BENAZEPRIL HCL 40 MG TABLET: 40 | 30 days supply | Qty: 30 | Fill #5

## 2020-01-27 MED FILL — HYDROCHLOROTHIAZIDE 25 MG T: 25 | 30 days supply | Qty: 30 | Fill #3

## 2020-02-03 ENCOUNTER — Other Ambulatory Visit: Payer: Self-pay | Admitting: Family Medicine

## 2020-02-03 DIAGNOSIS — I10 Essential (primary) hypertension: Secondary | ICD-10-CM

## 2020-02-03 MED FILL — ?OMEPRAZOLE 20 MG CPDR: 20 | 30 days supply | Qty: 30 | Fill #5

## 2020-02-03 MED FILL — ?CARVEDILOL 25 MG TABLET: 25 | 30 days supply | Qty: 60 | Fill #5

## 2020-02-03 NOTE — Telephone Encounter (Signed)
Requested Prescriptions  Pending Prescriptions Disp Refills  . spironolactone (ALDACTONE) 25 MG tablet [Pharmacy Med Name: SPIRONOLACTONE 25 MG TABLE 25 Tablet] 90 tablet 0    Sig: TAKE 1 TABLET (25 MG TOTAL) BY MOUTH DAILY.     Cardiovascular: Diuretics - Aldosterone Antagonist Failed - 02/03/2020  8:59 AM      Failed - Last BP in normal range    BP Readings from Last 1 Encounters:  10/28/19 (!) 145/85         Passed - Cr in normal range and within 360 days    Creat  Date Value Ref Range Status  04/26/2016 1.00 0.50 - 1.05 mg/dL Final    Comment:      For patients > or = 58 years of age: The upper reference limit for Creatinine is approximately 13% higher for people identified as African-American.      Creatinine, Ser  Date Value Ref Range Status  12/03/2019 0.99 0.57 - 1.00 mg/dL Final   Creatinine, Urine  Date Value Ref Range Status  12/23/2015 198 20 - 320 mg/dL Final         Passed - K in normal range and within 360 days    Potassium  Date Value Ref Range Status  12/03/2019 3.7 3.5 - 5.2 mmol/L Final         Passed - Na in normal range and within 360 days    Sodium  Date Value Ref Range Status  12/03/2019 139 134 - 144 mmol/L Final         Passed - Valid encounter within last 6 months    Recent Outpatient Visits          2 months ago Type 2 diabetes mellitus with complication, with long-term current use of insulin (HCC)   [facility] [place], [place], MD   5 months ago Type 2 diabetes mellitus with diabetic polyneuropathy, with long-term current use of insulin (HCC)   [facility] [place], [place], MD   8 months ago Type 2 diabetes mellitus with diabetic polyneuropathy, with long-term current use of insulin (HCC)   [facility] [place], [place], MD   11 months ago Type 2 diabetes mellitus with diabetic polyneuropathy, with long-term current use of insulin (HCC)   [facility] [place], [place], MD   1 year ago Type 2 diabetes mellitus with diabetic polyneuropathy, with long-term current use of insulin (HCC)   [facility] Hoy Register, MD      Future Appointments            In 1 month Hoy Register, MD [facility]

## 2020-02-11 MED FILL — ?SPIRONOLACTONE 25 MG TABLE: 25 | 30 days supply | Qty: 30 | Fill #0

## 2020-02-19 MED FILL — HYDROCHLOROTHIAZIDE 25 MG T: 25 | 30 days supply | Qty: 30 | Fill #4

## 2020-03-02 ENCOUNTER — Other Ambulatory Visit: Payer: Self-pay | Admitting: Family Medicine

## 2020-03-02 DIAGNOSIS — E876 Hypokalemia: Secondary | ICD-10-CM

## 2020-03-02 DIAGNOSIS — K257 Chronic gastric ulcer without hemorrhage or perforation: Secondary | ICD-10-CM

## 2020-03-02 DIAGNOSIS — I10 Essential (primary) hypertension: Secondary | ICD-10-CM

## 2020-03-02 MED FILL — $LANTUS 100 UNITS/ML VIAL: 100 | 25 days supply | Qty: 30 | Fill #3

## 2020-03-02 MED FILL — POTASSIUM CL ER 20 MEQ TAB: 20 | 30 days supply | Qty: 45 | Fill #0

## 2020-03-02 MED FILL — BENAZEPRIL HCL 40 MG TABLET: 40 | 30 days supply | Qty: 30 | Fill #0

## 2020-03-02 MED FILL — GLIMEPIRIDE 4 MG TABS: 4 | 30 days supply | Qty: 60 | Fill #1

## 2020-03-03 ENCOUNTER — Other Ambulatory Visit: Payer: Self-pay | Admitting: Family Medicine

## 2020-03-03 DIAGNOSIS — K257 Chronic gastric ulcer without hemorrhage or perforation: Secondary | ICD-10-CM

## 2020-03-03 MED FILL — ?OMEPRAZOLE 20 MG CPDR: 20 | 30 days supply | Qty: 30 | Fill #0

## 2020-03-03 MED FILL — ?CARVEDILOL 25 MG TABLET: 25 | 30 days supply | Qty: 60 | Fill #0

## 2020-03-04 ENCOUNTER — Ambulatory Visit: Payer: Self-pay | Attending: Family Medicine | Admitting: Family Medicine

## 2020-03-04 ENCOUNTER — Encounter: Payer: Self-pay | Admitting: Family Medicine

## 2020-03-04 ENCOUNTER — Other Ambulatory Visit: Payer: Self-pay

## 2020-03-04 ENCOUNTER — Other Ambulatory Visit: Payer: Self-pay | Admitting: Family Medicine

## 2020-03-04 VITALS — BP 127/75 | HR 74 | Ht 64.0 in | Wt 189.4 lb

## 2020-03-04 DIAGNOSIS — E1142 Type 2 diabetes mellitus with diabetic polyneuropathy: Secondary | ICD-10-CM

## 2020-03-04 DIAGNOSIS — R42 Dizziness and giddiness: Secondary | ICD-10-CM

## 2020-03-04 DIAGNOSIS — F411 Generalized anxiety disorder: Secondary | ICD-10-CM

## 2020-03-04 DIAGNOSIS — E7800 Pure hypercholesterolemia, unspecified: Secondary | ICD-10-CM

## 2020-03-04 DIAGNOSIS — E1143 Type 2 diabetes mellitus with diabetic autonomic (poly)neuropathy: Secondary | ICD-10-CM

## 2020-03-04 DIAGNOSIS — M6530 Trigger finger, unspecified finger: Secondary | ICD-10-CM

## 2020-03-04 DIAGNOSIS — J4520 Mild intermittent asthma, uncomplicated: Secondary | ICD-10-CM

## 2020-03-04 DIAGNOSIS — Z794 Long term (current) use of insulin: Secondary | ICD-10-CM

## 2020-03-04 DIAGNOSIS — G4709 Other insomnia: Secondary | ICD-10-CM

## 2020-03-04 DIAGNOSIS — K257 Chronic gastric ulcer without hemorrhage or perforation: Secondary | ICD-10-CM

## 2020-03-04 DIAGNOSIS — K3184 Gastroparesis: Secondary | ICD-10-CM

## 2020-03-04 DIAGNOSIS — E118 Type 2 diabetes mellitus with unspecified complications: Secondary | ICD-10-CM

## 2020-03-04 DIAGNOSIS — I10 Essential (primary) hypertension: Secondary | ICD-10-CM

## 2020-03-04 DIAGNOSIS — E876 Hypokalemia: Secondary | ICD-10-CM

## 2020-03-04 LAB — POCT GLYCOSYLATED HEMOGLOBIN (HGB A1C): Hemoglobin A1C: 8.1 % — AB (ref 4.0–5.6)

## 2020-03-04 LAB — GLUCOSE, POCT (MANUAL RESULT ENTRY): POC Glucose: 106 mg/dl — AB (ref 70–99)

## 2020-03-04 MED ORDER — POTASSIUM CHLORIDE CRYS ER 20 MEQ PO TBCR: 30.0000 meq | EXTENDED_RELEASE_TABLET | Freq: Every day | ORAL | 6 refills | Status: DC

## 2020-03-04 MED ORDER — TRAZODONE HCL 100 MG PO TABS: 50.0000 mg | ORAL_TABLET | Freq: Every day | ORAL | 1 refills | Status: DC

## 2020-03-04 MED ORDER — OMEPRAZOLE 20 MG PO CPDR: 20.0000 mg | DELAYED_RELEASE_CAPSULE | Freq: Every day | ORAL | 1 refills | Status: DC

## 2020-03-04 MED ORDER — SPIRONOLACTONE 25 MG PO TABS: 25.0000 mg | ORAL_TABLET | Freq: Every day | ORAL | 1 refills | Status: DC

## 2020-03-04 MED ORDER — HYDROXYZINE HCL 25 MG PO TABS: 25.0000 mg | ORAL_TABLET | Freq: Three times a day (TID) | ORAL | 1 refills | Status: DC | PRN

## 2020-03-04 MED ORDER — SIMVASTATIN 10 MG PO TABS: 10.0000 mg | ORAL_TABLET | Freq: Every day | ORAL | 1 refills | Status: DC

## 2020-03-04 MED ORDER — BENAZEPRIL HCL 40 MG PO TABS: 40.0000 mg | ORAL_TABLET | Freq: Every day | ORAL | 1 refills | Status: DC

## 2020-03-04 MED ORDER — GLIMEPIRIDE 4 MG PO TABS: 4.0000 mg | ORAL_TABLET | Freq: Two times a day (BID) | ORAL | 1 refills | Status: DC

## 2020-03-04 MED ORDER — HYDROCHLOROTHIAZIDE 25 MG PO TABS: 25.0000 mg | ORAL_TABLET | Freq: Every day | ORAL | 1 refills | Status: DC

## 2020-03-04 MED ORDER — DULERA 100-5 MCG/ACT IN AERO: 2.0000 | INHALATION_SPRAY | Freq: Two times a day (BID) | RESPIRATORY_TRACT | 6 refills | Status: DC

## 2020-03-04 MED ORDER — INSULIN GLARGINE 100 UNIT/ML ~~LOC~~ SOLN: 60.0000 [IU] | Freq: Two times a day (BID) | SUBCUTANEOUS | 6 refills | Status: DC

## 2020-03-04 MED ORDER — METOCLOPRAMIDE HCL 10 MG PO TABS: 10.0000 mg | ORAL_TABLET | Freq: Three times a day (TID) | ORAL | 2 refills | Status: DC | PRN

## 2020-03-04 MED ORDER — INSULIN LISPRO 100 UNIT/ML ~~LOC~~ SOLN: 0.0000 [IU] | Freq: Three times a day (TID) | SUBCUTANEOUS | 6 refills | Status: DC

## 2020-03-04 MED ORDER — CARVEDILOL 25 MG PO TABS: 25.0000 mg | ORAL_TABLET | Freq: Two times a day (BID) | ORAL | 1 refills | Status: DC

## 2020-03-04 NOTE — Progress Notes (Signed)
Needs refills on medications. No food this morning  Took morning medications.

## 2020-03-04 NOTE — Patient Instructions (Signed)
Trigger Finger  Trigger finger, also called stenosing tenosynovitis,  is a condition that causes a finger to get stuck in a bent position. Each finger has a tendon, which is a tough, cord-like tissue that connects muscle to bone, and each tendon passes through a tunnel of tissue called a tendon sheath. To move your finger, your tendon needs to glide freely through the sheath. Trigger finger happens when the tendon or the sheath thickens, making it difficult to move your finger. Trigger finger can affect any finger or a thumb. It may affect more than one finger. Mild cases may clear up with rest and medicine. Severe cases require more treatment. What are the causes? Trigger finger is caused by a thickened finger tendon or tendon sheath. The cause of this thickening is not known. What increases the risk? The following factors may make you more likely to develop this condition:  Doing activities that require a strong grip.  Having rheumatoid arthritis, gout, or diabetes.  Being 40-60 years old.  Being female. What are the signs or symptoms? Symptoms of this condition include:  Pain when bending or straightening your finger.  Tenderness or swelling where your finger attaches to the palm of your hand.  A lump in the palm of your hand or on the inside of your finger.  Hearing a noise like a pop or a snap when you try to straighten your finger.  Feeling a catching or locking sensation when you try to straighten your finger.  Being unable to straighten your finger. How is this diagnosed? This condition is diagnosed based on your symptoms and a physical exam. How is this treated? This condition may be treated by:  Resting your finger and avoiding activities that make symptoms worse.  Wearing a finger splint to keep your finger extended.  Taking NSAIDs, such as ibuprofen, to relieve pain and swelling.  Doing gentle exercises to stretch the finger as told by your health care provider.   Having medicine that reduces swelling and inflammation (steroids) injected into the tendon sheath. Injections may need to be repeated.  Having surgery to open the tendon sheath. This may be done if other treatments do not work and you cannot straighten your finger. You may need physical therapy after surgery. Follow these instructions at home: If you have a splint:  Wear the splint as told by your health care provider. Remove it only as told by your health care provider.  Loosen it if your fingers tingle, become numb, or turn cold and blue.  Keep it clean.  If the splint is not waterproof: ? Do not let it get wet. ? Cover it with a watertight covering when you take a bath or shower. Managing pain, stiffness, and swelling     If directed, apply heat to the affected area as often as told by your health care provider. Use the heat source that your health care provider recommends, such as a moist heat pack or a heating pad.  Place a towel between your skin and the heat source.  Leave the heat on for 20-30 minutes.  Remove the heat if your skin turns bright red. This is especially important if you are unable to feel pain, heat, or cold. You may have a greater risk of getting burned. If directed, put ice on the painful area. To do this:  If you have a removable splint, remove it as told by your health care provider.  Put ice in a plastic bag.  Place a   towel between your skin and the bag or between your splint and the bag.  Leave the ice on for 20 minutes, 2-3 times a day.  Activity  Rest your finger as told by your health care provider. Avoid activities that make the pain worse.  Return to your normal activities as told by your health care provider. Ask your health care provider what activities are safe for you.  Do exercises as told by your health care provider.  Ask your health care provider when it is safe to drive if you have a splint on your hand. General instructions   Take over-the-counter and prescription medicines only as told by your health care provider.  Keep all follow-up visits as told by your health care provider. This is important. Contact a health care provider if:  Your symptoms are not improving with home care. Summary  Trigger finger, also called stenosing tenosynovitis, causes your finger to get stuck in a bent position. This can make it difficult and painful to straighten your finger.  This condition develops when a finger tendon or tendon sheath thickens.  Treatment may include resting your finger, wearing a splint, and taking medicines.  In severe cases, surgery to open the tendon sheath may be needed. This information is not intended to replace advice given to you by your health care provider. Make sure you discuss any questions you have with your health care provider. Document Revised: 07/02/2018 Document Reviewed: 07/02/2018 Elsevier Patient Education  2020 [employer]  
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
## 2020-03-05 LAB — LIPID PANEL
Chol/HDL Ratio: 4.9 ratio — ABNORMAL HIGH (ref 0.0–4.4)
Cholesterol, Total: 156 mg/dL (ref 100–199)
HDL: 32 mg/dL — ABNORMAL LOW (ref >39)
LDL Chol Calc (NIH): 93 mg/dL (ref 0–99)
Triglycerides: 179 mg/dL — ABNORMAL HIGH (ref 0–149)
VLDL Cholesterol Cal: 31 mg/dL (ref 5–40)

## 2020-03-05 LAB — CMP14+EGFR
ALT: 14 IU/L (ref 0–32)
AST: 19 IU/L (ref 0–40)
Albumin/Globulin Ratio: 1.7 (ref 1.2–2.2)
Albumin: 4.3 g/dL (ref 3.8–4.9)
Alkaline Phosphatase: 63 IU/L (ref 44–121)
BUN/Creatinine Ratio: 13 (ref 9–23)
BUN: 15 mg/dL (ref 6–24)
Bilirubin Total: 0.4 mg/dL (ref 0.0–1.2)
CO2: 28 mmol/L (ref 20–29)
Calcium: 9.1 mg/dL (ref 8.7–10.2)
Chloride: 99 mmol/L (ref 96–106)
Creatinine, Ser: 1.16 mg/dL — ABNORMAL HIGH (ref 0.57–1.00)
GFR calc Af Amer: 60 mL/min/{1.73_m2} (ref >59)
GFR calc non Af Amer: 52 mL/min/{1.73_m2} — ABNORMAL LOW (ref >59)
Globulin, Total: 2.6 g/dL (ref 1.5–4.5)
Glucose: 92 mg/dL (ref 65–99)
Potassium: 4 mmol/L (ref 3.5–5.2)
Sodium: 142 mmol/L (ref 134–144)
Total Protein: 6.9 g/dL (ref 6.0–8.5)

## 2020-03-05 LAB — MICROALBUMIN / CREATININE URINE RATIO
Creatinine, Urine: 187.5 mg/dL
Microalb/Creat Ratio: 9 mg/g creat (ref 0–29)
Microalbumin, Urine: 17 ug/mL

## 2020-03-10 ENCOUNTER — Ambulatory Visit: Payer: Self-pay

## 2020-03-12 MED FILL — ?SPIRONOLACTONE 25 MG TABLE: 25 | 30 days supply | Qty: 30 | Fill #1

## 2020-03-16 ENCOUNTER — Ambulatory Visit: Payer: Self-pay

## 2020-03-23 ENCOUNTER — Ambulatory Visit: Payer: Self-pay

## 2020-03-23 ENCOUNTER — Encounter (HOSPITAL_COMMUNITY): Payer: Self-pay

## 2020-03-23 ENCOUNTER — Other Ambulatory Visit: Payer: Self-pay

## 2020-03-23 ENCOUNTER — Ambulatory Visit (HOSPITAL_COMMUNITY)
Admission: EM | Admit: 2020-03-23 | Discharge: 2020-03-23 | Disposition: A | Discharge status: Home / Self Care | Payer: HRSA Program | Attending: Student | Admitting: Student

## 2020-03-23 ENCOUNTER — Other Ambulatory Visit (HOSPITAL_COMMUNITY): Payer: Self-pay | Admitting: Student

## 2020-03-23 DIAGNOSIS — R059 Cough, unspecified: Secondary | ICD-10-CM

## 2020-03-23 DIAGNOSIS — J45909 Unspecified asthma, uncomplicated: Secondary | ICD-10-CM | POA: Diagnosis present

## 2020-03-23 DIAGNOSIS — J069 Acute upper respiratory infection, unspecified: Secondary | ICD-10-CM | POA: Insufficient documentation

## 2020-03-23 DIAGNOSIS — Z1152 Encounter for screening for COVID-19: Secondary | ICD-10-CM | POA: Insufficient documentation

## 2020-03-23 DIAGNOSIS — J209 Acute bronchitis, unspecified: Secondary | ICD-10-CM | POA: Diagnosis present

## 2020-03-23 DIAGNOSIS — E119 Type 2 diabetes mellitus without complications: Secondary | ICD-10-CM | POA: Diagnosis present

## 2020-03-23 DIAGNOSIS — R0981 Nasal congestion: Secondary | ICD-10-CM

## 2020-03-23 LAB — CBG MONITORING, ED: Glucose-Capillary: 164 mg/dL — ABNORMAL HIGH (ref 70–99)

## 2020-03-23 MED ORDER — BENZONATATE 100 MG PO CAPS: 100.0000 mg | ORAL_CAPSULE | Freq: Three times a day (TID) | ORAL | 0 refills | Status: DC

## 2020-03-23 MED ORDER — PREDNISONE 10 MG PO TABS: 10.0000 mg | ORAL_TABLET | Freq: Every day | ORAL | 0 refills | Status: DC

## 2020-03-23 MED ORDER — PROMETHAZINE-DM 6.25-15 MG/5ML PO SYRP: 5.0000 mL | ORAL_SOLUTION | Freq: Four times a day (QID) | ORAL | 0 refills | Status: DC | PRN

## 2020-03-23 MED ORDER — PREDNISONE 20 MG PO TABS: 40.0000 mg | ORAL_TABLET | Freq: Every day | ORAL | 0 refills | Status: DC

## 2020-03-23 MED FILL — BENZONATATE 100 MG CAPS: 100 | 7 days supply | Qty: 21 | Fill #0

## 2020-03-23 MED FILL — PROMETHAZINE-DM 6.25-15 MG/: 6.25-15 | 5 days supply | Qty: 118 | Fill #0

## 2020-03-23 MED FILL — HYDROCHLOROTHIAZIDE 25 MG T: 25 | 30 days supply | Qty: 30 | Fill #5

## 2020-03-23 MED FILL — predniSONE 10 MG TABS: 10 | 5 days supply | Qty: 5 | Fill #0

## 2020-03-23 NOTE — Discharge Instructions (Addendum)
-  Tessalon as needed for cough. Take one pill up to 3x daily (every 8 hours) -Promethazine DM cough syrup for congestion/cough. This could make you drowsy, so take at night before bed. -Prednisone 10mg  (1 pill) for 5 days. Monitor your blood sugars at home. If these are running >200 fasting, please stop prednisone.  -Use the nausea medication you have at home for nausea/vomiting -Seek additional medical attention if chest pain, shortness of breath, new/worsening fevers/chills, etc  We are currently awaiting result of your PCR covid-19 test. This typically comes back in 1-2 days. We'll call you if the result is positive. Otherwise, the result will be sent electronically to your MyChart. You can also call this clinic and ask for your result via telephone.   Please isolate at home while awaiting these results. If your test is positive for Covid-19, continue to isolate at home for 5 days if you have mild symptoms, or a total of 10 days from symptom onset if you have more severe symptoms. If you quarantine for a shorter period of time (i.e. 5 days), make sure to wear a mask until day 10 of symptoms. Treat your symptoms at home with OTC remedies like tylenol/ibuprofen, mucinex, nyquil, etc. Seek medical attention if you develop high fevers, chest pain, shortness of breath, ear pain, facial pain, etc. Make sure to get up and move around every 2-3 hours while convalescing to help prevent blood clots. Drink plenty of fluids, and rest as much as possible.

## 2020-03-23 NOTE — ED Triage Notes (Signed)
Pt presents with cough, nasal congestion and body aches x 2 days.
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
## 2020-03-24 ENCOUNTER — Encounter: Payer: Self-pay | Admitting: Family Medicine

## 2020-03-24 LAB — SARS CORONAVIRUS 2 (TAT 6-24 HRS): SARS Coronavirus 2: NEGATIVE

## 2020-03-26 ENCOUNTER — Ambulatory Visit: Payer: Self-pay

## 2020-03-26 ENCOUNTER — Other Ambulatory Visit: Payer: Self-pay | Admitting: Family Medicine

## 2020-03-26 DIAGNOSIS — J4520 Mild intermittent asthma, uncomplicated: Secondary | ICD-10-CM

## 2020-03-26 MED ORDER — AZITHROMYCIN 250 MG PO TABS: ORAL_TABLET | ORAL | 0 refills | Status: DC

## 2020-03-26 MED FILL — ?AZITHROMYCIN 250MG TABLETS: 250 | 5 days supply | Qty: 6 | Fill #0

## 2020-03-26 MED FILL — IPRAT-ALBUT 0.5-3(2.5) MG/3: 0.5-2.5 (3) | 7 days supply | Qty: 90 | Fill #0

## 2020-04-01 MED FILL — POTASSIUM CL ER 20 MEQ TAB: 20 | 30 days supply | Qty: 45 | Fill #1

## 2020-04-03 MED FILL — $LANTUS 100 UNITS/ML VIAL: 100 | 25 days supply | Qty: 30 | Fill #4

## 2020-04-03 MED FILL — ?OMEPRAZOLE 20 MG CPDR: 20 | 30 days supply | Qty: 30 | Fill #1

## 2020-04-03 MED FILL — BENAZEPRIL HCL 40 MG TABLET: 40 | 30 days supply | Qty: 30 | Fill #1

## 2020-04-16 MED FILL — GLIMEPIRIDE 4 MG TABS: 4 | 30 days supply | Qty: 60 | Fill #2

## 2020-04-16 MED FILL — HYDROCHLOROTHIAZIDE 25 MG T: 25 | 90 days supply | Qty: 90 | Fill #0

## 2020-04-16 MED FILL — ?SPIRONOLACTONE 25 MG TABLE: 25 | 30 days supply | Qty: 30 | Fill #2

## 2020-05-01 MED FILL — POTASSIUM CHLORIDE 20meqER: 20 | 30 days supply | Qty: 45 | Fill #0

## 2020-05-01 MED FILL — ?CARVEDILOL 25 MG TABLET: 25 | 30 days supply | Qty: 60 | Fill #1

## 2020-05-11 MED FILL — ?OMEPRAZOLE 20 MG CPDR: 20 | 30 days supply | Qty: 30 | Fill #0

## 2020-05-18 ENCOUNTER — Other Ambulatory Visit: Payer: Self-pay | Admitting: Family Medicine
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
## 2020-05-30 ENCOUNTER — Other Ambulatory Visit: Payer: Self-pay

## 2020-05-30 MED FILL — Potassium Chloride Microencapsulated Crys ER Tab 20 mEq: ORAL | 30 days supply | Qty: 45 | Fill #0 | Status: AC

## 2020-05-31 ENCOUNTER — Other Ambulatory Visit: Payer: Self-pay

## 2020-06-02 ENCOUNTER — Other Ambulatory Visit: Payer: Self-pay

## 2020-06-02 ENCOUNTER — Ambulatory Visit: Payer: Self-pay | Attending: Family Medicine | Admitting: Family Medicine

## 2020-06-02 ENCOUNTER — Encounter: Payer: Self-pay | Admitting: Family Medicine

## 2020-06-02 VITALS — BP 149/90 | HR 83 | Ht 65.0 in | Wt 184.2 lb

## 2020-06-02 DIAGNOSIS — K3184 Gastroparesis: Secondary | ICD-10-CM

## 2020-06-02 DIAGNOSIS — Z794 Long term (current) use of insulin: Secondary | ICD-10-CM

## 2020-06-02 DIAGNOSIS — J4520 Mild intermittent asthma, uncomplicated: Secondary | ICD-10-CM

## 2020-06-02 DIAGNOSIS — E7800 Pure hypercholesterolemia, unspecified: Secondary | ICD-10-CM

## 2020-06-02 DIAGNOSIS — G4709 Other insomnia: Secondary | ICD-10-CM

## 2020-06-02 DIAGNOSIS — I10 Essential (primary) hypertension: Secondary | ICD-10-CM

## 2020-06-02 DIAGNOSIS — E1143 Type 2 diabetes mellitus with diabetic autonomic (poly)neuropathy: Secondary | ICD-10-CM

## 2020-06-02 DIAGNOSIS — E118 Type 2 diabetes mellitus with unspecified complications: Secondary | ICD-10-CM

## 2020-06-02 DIAGNOSIS — E876 Hypokalemia: Secondary | ICD-10-CM

## 2020-06-02 DIAGNOSIS — E1142 Type 2 diabetes mellitus with diabetic polyneuropathy: Secondary | ICD-10-CM

## 2020-06-02 DIAGNOSIS — K257 Chronic gastric ulcer without hemorrhage or perforation: Secondary | ICD-10-CM

## 2020-06-02 LAB — POCT GLYCOSYLATED HEMOGLOBIN (HGB A1C): Hemoglobin A1C: 7.4 % — AB (ref 4.0–5.6)

## 2020-06-02 LAB — GLUCOSE, POCT (MANUAL RESULT ENTRY): POC Glucose: 240 mg/dl — AB (ref 70–99)

## 2020-06-02 MED ORDER — GABAPENTIN 300 MG PO CAPS
600.0000 mg | ORAL_CAPSULE | Freq: Two times a day (BID) | ORAL | 1 refills | Status: DC
  Filled 2020-06-02 – 2020-07-30 (×2): qty 120, 30d supply, fill #0
  Filled 2020-09-02: qty 120, 30d supply, fill #1
  Filled 2021-01-28: qty 120, 30d supply, fill #2
  Filled 2021-04-21: qty 120, 30d supply, fill #3
  Filled 2021-04-21: qty 120, 30d supply, fill #0

## 2020-06-02 MED ORDER — INSULIN GLARGINE 100 UNIT/ML ~~LOC~~ SOLN
SUBCUTANEOUS | 6 refills | Status: DC
  Filled 2020-06-02 – 2020-06-18 (×2): qty 30, 25d supply, fill #0
  Filled 2020-07-30: qty 30, 25d supply, fill #1
  Filled 2020-09-02: qty 30, 25d supply, fill #2

## 2020-06-02 MED ORDER — MOMETASONE FURO-FORMOTEROL FUM 100-5 MCG/ACT IN AERO
2.0000 | INHALATION_SPRAY | Freq: Two times a day (BID) | RESPIRATORY_TRACT | 6 refills | Status: AC
  Filled 2020-06-02: qty 13, 30d supply, fill #0

## 2020-06-02 MED ORDER — HYDROCHLOROTHIAZIDE 25 MG PO TABS
ORAL_TABLET | Freq: Every day | ORAL | 1 refills | Status: DC
  Filled 2020-06-02 – 2020-07-30 (×2): qty 30, 30d supply, fill #0
  Filled 2020-08-26: qty 30, 30d supply, fill #1
  Filled 2020-09-30: qty 30, 30d supply, fill #2
  Filled 2020-10-29: qty 30, 30d supply, fill #3
  Filled 2020-11-29 – 2020-12-02 (×5): qty 30, 30d supply, fill #4
  Filled 2020-12-28: qty 30, 30d supply, fill #5

## 2020-06-02 MED ORDER — INSULIN LISPRO 100 UNIT/ML ~~LOC~~ SOLN
SUBCUTANEOUS | 6 refills | Status: DC
  Filled 2020-06-02: qty 10, 27d supply, fill #0

## 2020-06-02 MED ORDER — SPIRONOLACTONE 25 MG PO TABS
ORAL_TABLET | Freq: Every day | ORAL | 1 refills | Status: DC
  Filled 2020-06-02 – 2020-06-18 (×2): qty 30, 30d supply, fill #0
  Filled 2020-07-06: qty 30, 30d supply, fill #1
  Filled 2020-08-11: qty 30, 30d supply, fill #2
  Filled 2020-09-10: qty 30, 30d supply, fill #3
  Filled 2020-10-11: qty 30, 30d supply, fill #4
  Filled 2020-11-12: qty 30, 30d supply, fill #5

## 2020-06-02 MED ORDER — BENAZEPRIL HCL 40 MG PO TABS
ORAL_TABLET | Freq: Every day | ORAL | 1 refills | Status: DC
  Filled 2020-06-02: qty 30, 30d supply, fill #0
  Filled 2020-07-06: qty 30, 30d supply, fill #1
  Filled 2020-08-11: qty 30, 30d supply, fill #2
  Filled 2020-09-10: qty 30, 30d supply, fill #3
  Filled 2020-10-11: qty 30, 30d supply, fill #4
  Filled 2020-11-12: qty 30, 30d supply, fill #5

## 2020-06-02 MED ORDER — METOCLOPRAMIDE HCL 10 MG PO TABS
ORAL_TABLET | ORAL | 2 refills | Status: DC
  Filled 2020-06-02: qty 60, 20d supply, fill #0

## 2020-06-02 MED ORDER — OMEPRAZOLE 20 MG PO CPDR
20.0000 mg | DELAYED_RELEASE_CAPSULE | Freq: Every day | ORAL | 1 refills | Status: DC
  Filled 2020-06-02 (×2): qty 30, 30d supply, fill #0
  Filled 2020-07-06: qty 30, 30d supply, fill #1

## 2020-06-02 MED ORDER — TRAZODONE HCL 100 MG PO TABS
ORAL_TABLET | ORAL | 1 refills | Status: DC
  Filled 2020-06-02: qty 15, 30d supply, fill #0

## 2020-06-02 MED ORDER — GLIMEPIRIDE 4 MG PO TABS
ORAL_TABLET | ORAL | 1 refills | Status: DC
  Filled 2020-06-02: qty 60, 30d supply, fill #0
  Filled 2020-07-30: qty 60, 30d supply, fill #1
  Filled 2020-09-02: qty 60, 30d supply, fill #2
  Filled 2020-10-11: qty 60, 30d supply, fill #3
  Filled 2021-01-14: qty 60, 30d supply, fill #4

## 2020-06-02 MED ORDER — POTASSIUM CHLORIDE CRYS ER 20 MEQ PO TBCR
EXTENDED_RELEASE_TABLET | ORAL | 6 refills | Status: DC
  Filled 2020-06-29: qty 45, 30d supply, fill #0
  Filled 2020-07-30: qty 45, 30d supply, fill #1
  Filled 2020-08-26: qty 45, 30d supply, fill #2
  Filled 2020-09-30: qty 45, 30d supply, fill #3
  Filled 2020-10-29: qty 45, 30d supply, fill #4
  Filled 2020-11-29: qty 45, 30d supply, fill #5
  Filled 2020-12-28: qty 45, 30d supply, fill #6

## 2020-06-02 MED ORDER — SIMVASTATIN 10 MG PO TABS
ORAL_TABLET | Freq: Every day | ORAL | 1 refills | Status: DC
  Filled 2020-06-02 – 2020-07-30 (×2): qty 30, 30d supply, fill #0
  Filled 2020-09-02: qty 30, 30d supply, fill #1
  Filled 2021-01-14: qty 30, 30d supply, fill #2
  Filled 2021-02-18: qty 30, 30d supply, fill #3
  Filled 2021-03-19: qty 30, 30d supply, fill #0
  Filled 2021-04-21: qty 30, 30d supply, fill #1

## 2020-06-02 MED ORDER — CARVEDILOL 25 MG PO TABS
ORAL_TABLET | Freq: Two times a day (BID) | ORAL | 1 refills | Status: DC
  Filled 2020-06-02: qty 60, 30d supply, fill #0
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
## 2020-06-02 NOTE — Patient Instructions (Signed)

## 2020-06-16 ENCOUNTER — Other Ambulatory Visit: Payer: Self-pay | Admitting: Family Medicine

## 2020-06-16 DIAGNOSIS — I10 Essential (primary) hypertension: Secondary | ICD-10-CM

## 2020-06-16 NOTE — Telephone Encounter (Signed)
Copied from CRM [id number]. Topic: Quick Communication - Rx Refill/Question >> Jun 16, 2020 11:58 AM Gaetana Michaelis A wrote: Medication: spironolactone (ALDACTONE) 25 MG tablet  Has the patient contacted their pharmacy? No. Patient has been unable to successfully reach their pharmacy.   Preferred Pharmacy (with phone number or street name): [facility]  Phone:  [phone] Fax:  [phone]  Agent: Please be advised that RX refills may take up to 3 business days. We ask that you follow-up with your pharmacy.

## 2020-06-18 ENCOUNTER — Other Ambulatory Visit: Payer: Self-pay

## 2020-06-29 ENCOUNTER — Other Ambulatory Visit: Payer: Self-pay

## 2020-07-03 ENCOUNTER — Other Ambulatory Visit: Payer: Self-pay

## 2020-07-03 MED ORDER — OMEPRAZOLE 20 MG PO CPDR
DELAYED_RELEASE_CAPSULE | ORAL | 1 refills | Status: DC
  Filled 2020-07-30: qty 30, 30d supply, fill #0

## 2020-07-03 MED ORDER — LANTUS SOLOSTAR 100 UNIT/ML ~~LOC~~ SOPN: PEN_INJECTOR | SUBCUTANEOUS | 6 refills | Status: DC

## 2020-07-06 ENCOUNTER — Other Ambulatory Visit: Payer: Self-pay | Admitting: Family Medicine

## 2020-07-06 ENCOUNTER — Other Ambulatory Visit: Payer: Self-pay

## 2020-07-06 ENCOUNTER — Encounter: Payer: Self-pay | Admitting: Family Medicine

## 2020-07-06 MED ORDER — FLUCONAZOLE 150 MG PO TABS
150.0000 mg | ORAL_TABLET | Freq: Once | ORAL | 1 refills | Status: AC
  Filled 2020-07-06: qty 2, 14d supply, fill #0

## 2020-07-07 ENCOUNTER — Other Ambulatory Visit: Payer: Self-pay | Admitting: Family Medicine

## 2020-07-07 ENCOUNTER — Other Ambulatory Visit: Payer: Self-pay

## 2020-07-07 DIAGNOSIS — I10 Essential (primary) hypertension: Secondary | ICD-10-CM

## 2020-07-07 MED ORDER — CARVEDILOL 25 MG PO TABS
ORAL_TABLET | Freq: Two times a day (BID) | ORAL | 1 refills | Status: DC
  Filled 2020-07-07: qty 60, 30d supply, fill #0
  Filled 2020-08-12: qty 60, 30d supply, fill #1
  Filled 2020-09-30: qty 60, 30d supply, fill #2
  Filled 2020-11-29: qty 60, 30d supply, fill #3
  Filled 2021-01-27: qty 60, 30d supply, fill #4

## 2020-07-30 ENCOUNTER — Other Ambulatory Visit: Payer: Self-pay

## 2020-07-31 ENCOUNTER — Other Ambulatory Visit: Payer: Self-pay

## 2020-08-11 ENCOUNTER — Other Ambulatory Visit: Payer: Self-pay

## 2020-08-12 ENCOUNTER — Other Ambulatory Visit: Payer: Self-pay

## 2020-08-13 ENCOUNTER — Other Ambulatory Visit: Payer: Self-pay

## 2020-08-26 ENCOUNTER — Other Ambulatory Visit: Payer: Self-pay

## 2020-08-27 ENCOUNTER — Other Ambulatory Visit: Payer: Self-pay

## 2020-08-27 ENCOUNTER — Encounter: Payer: Self-pay | Admitting: Family Medicine

## 2020-08-27 ENCOUNTER — Telehealth: Payer: Self-pay | Admitting: Family Medicine

## 2020-08-27 NOTE — Telephone Encounter (Signed)
FYI: Pt came in requesting to speak w/ PCP nurse Regarding her knee. It is Not swelling but hurting really bad and cannot take ibuprofen and wants to know what to take.  She's been using a brace but it's not helping. PT had an appt that needed to be rescheduled (09/07/20- Provider out of the office) and appt was rescheduled for Wed. 09/02/20 at 8:10

## 2020-09-01 NOTE — Telephone Encounter (Signed)
She had sent me a MyChart message with this information for which advice had been provided.  She has an upcoming appointment with me tomorrow.

## 2020-09-01 NOTE — Telephone Encounter (Signed)
Will route to PCP for review. 

## 2020-09-02 ENCOUNTER — Other Ambulatory Visit: Payer: Self-pay

## 2020-09-02 ENCOUNTER — Other Ambulatory Visit: Payer: Self-pay | Admitting: Family Medicine

## 2020-09-02 ENCOUNTER — Ambulatory Visit: Payer: Self-pay | Attending: Family Medicine | Admitting: Family Medicine

## 2020-09-02 DIAGNOSIS — Z794 Long term (current) use of insulin: Secondary | ICD-10-CM

## 2020-09-02 DIAGNOSIS — M1711 Unilateral primary osteoarthritis, right knee: Secondary | ICD-10-CM

## 2020-09-02 DIAGNOSIS — E1142 Type 2 diabetes mellitus with diabetic polyneuropathy: Secondary | ICD-10-CM

## 2020-09-02 DIAGNOSIS — J40 Bronchitis, not specified as acute or chronic: Secondary | ICD-10-CM

## 2020-09-02 MED ORDER — OMEPRAZOLE 20 MG PO CPDR
DELAYED_RELEASE_CAPSULE | ORAL | 1 refills | Status: DC
  Filled 2020-09-02 – 2020-09-03 (×2): qty 30, 30d supply, fill #0

## 2020-09-02 MED ORDER — DICLOFENAC SODIUM 1 % EX GEL
4.0000 g | Freq: Four times a day (QID) | CUTANEOUS | 1 refills | Status: DC
  Filled 2020-09-02: qty 100, 12d supply, fill #0
  Filled 2020-09-02: qty 100, 6d supply, fill #0

## 2020-09-02 MED ORDER — PREDNISONE 20 MG PO TABS
20.0000 mg | ORAL_TABLET | Freq: Every day | ORAL | 0 refills | Status: DC
Start: 2020-09-02 — End: 2021-03-18
  Filled 2020-09-02: qty 5, 5d supply, fill #0
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
## 2020-09-03 ENCOUNTER — Other Ambulatory Visit: Payer: Self-pay

## 2020-09-03 ENCOUNTER — Ambulatory Visit: Payer: Self-pay | Attending: Family Medicine

## 2020-09-03 DIAGNOSIS — E1142 Type 2 diabetes mellitus with diabetic polyneuropathy: Secondary | ICD-10-CM

## 2020-09-03 DIAGNOSIS — Z794 Long term (current) use of insulin: Secondary | ICD-10-CM

## 2020-09-04 LAB — CMP14+EGFR
ALT: 23 IU/L (ref 0–32)
AST: 23 IU/L (ref 0–40)
Albumin/Globulin Ratio: 1.8 (ref 1.2–2.2)
Albumin: 4.6 g/dL (ref 3.8–4.9)
Alkaline Phosphatase: 78 IU/L (ref 44–121)
BUN/Creatinine Ratio: 21 (ref 9–23)
BUN: 26 mg/dL — ABNORMAL HIGH (ref 6–24)
Bilirubin Total: 0.4 mg/dL (ref 0.0–1.2)
CO2: 25 mmol/L (ref 20–29)
Calcium: 9.8 mg/dL (ref 8.7–10.2)
Chloride: 95 mmol/L — ABNORMAL LOW (ref 96–106)
Creatinine, Ser: 1.25 mg/dL — ABNORMAL HIGH (ref 0.57–1.00)
Globulin, Total: 2.5 g/dL (ref 1.5–4.5)
Glucose: 266 mg/dL — ABNORMAL HIGH (ref 65–99)
Potassium: 4.4 mmol/L (ref 3.5–5.2)
Sodium: 138 mmol/L (ref 134–144)
Total Protein: 7.1 g/dL (ref 6.0–8.5)
eGFR: 50 mL/min/{1.73_m2} — ABNORMAL LOW (ref >59)

## 2020-09-04 LAB — LIPID PANEL
Chol/HDL Ratio: 5.3 ratio — ABNORMAL HIGH (ref 0.0–4.4)
Cholesterol, Total: 175 mg/dL (ref 100–199)
HDL: 33 mg/dL — ABNORMAL LOW (ref >39)
LDL Chol Calc (NIH): 93 mg/dL (ref 0–99)
Triglycerides: 291 mg/dL — ABNORMAL HIGH (ref 0–149)
VLDL Cholesterol Cal: 49 mg/dL — ABNORMAL HIGH (ref 5–40)

## 2020-09-07 ENCOUNTER — Ambulatory Visit: Payer: Self-pay | Admitting: Family Medicine

## 2020-09-10 ENCOUNTER — Other Ambulatory Visit: Payer: Self-pay

## 2020-09-11 ENCOUNTER — Other Ambulatory Visit: Payer: Self-pay

## 2020-09-30 ENCOUNTER — Encounter: Payer: Self-pay | Admitting: Family Medicine

## 2020-09-30 ENCOUNTER — Other Ambulatory Visit: Payer: Self-pay

## 2020-10-01 ENCOUNTER — Ambulatory Visit: Payer: Self-pay | Admitting: Family Medicine

## 2020-10-12 ENCOUNTER — Other Ambulatory Visit: Payer: Self-pay

## 2020-10-13 ENCOUNTER — Other Ambulatory Visit: Payer: Self-pay

## 2020-10-20 ENCOUNTER — Ambulatory Visit: Payer: BC Managed Care – PPO | Attending: Family Medicine | Admitting: Family Medicine

## 2020-10-20 ENCOUNTER — Other Ambulatory Visit: Payer: Self-pay

## 2020-10-20 ENCOUNTER — Encounter: Payer: Self-pay | Admitting: Family Medicine

## 2020-10-20 DIAGNOSIS — K257 Chronic gastric ulcer without hemorrhage or perforation: Secondary | ICD-10-CM

## 2020-10-20 DIAGNOSIS — E1142 Type 2 diabetes mellitus with diabetic polyneuropathy: Secondary | ICD-10-CM | POA: Diagnosis not present

## 2020-10-20 DIAGNOSIS — Z794 Long term (current) use of insulin: Secondary | ICD-10-CM

## 2020-10-20 DIAGNOSIS — E1169 Type 2 diabetes mellitus with other specified complication: Secondary | ICD-10-CM

## 2020-10-20 DIAGNOSIS — E785 Hyperlipidemia, unspecified: Secondary | ICD-10-CM

## 2020-10-20 DIAGNOSIS — E1159 Type 2 diabetes mellitus with other circulatory complications: Secondary | ICD-10-CM

## 2020-10-20 DIAGNOSIS — I152 Hypertension secondary to endocrine disorders: Secondary | ICD-10-CM
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
## 2020-10-30 ENCOUNTER — Other Ambulatory Visit: Payer: Self-pay

## 2020-11-12 ENCOUNTER — Other Ambulatory Visit: Payer: Self-pay

## 2020-11-12 ENCOUNTER — Other Ambulatory Visit: Payer: Self-pay | Admitting: Family Medicine

## 2020-11-12 MED ORDER — LANTUS SOLOSTAR 100 UNIT/ML ~~LOC~~ SOPN
PEN_INJECTOR | SUBCUTANEOUS | 6 refills | Status: DC
  Filled 2020-11-12: qty 30, 25d supply, fill #0

## 2020-11-12 NOTE — Telephone Encounter (Signed)
Requested medication (s) are due for refill today: expired medication  Requested medication (s) are on the active medication list: yes   Last refill:  08/27/19-08/26/20 #30 6 refills  Future visit scheduled: no  Notes to clinic:  expired medication do you want to renew Rx?     Requested Prescriptions  Pending Prescriptions Disp Refills   insulin glargine (LANTUS SOLOSTAR) 100 UNIT/ML Solostar Pen 30 mL 6    Sig: INJECT 0.6 MLS (60 UNITS TOTAL) INTO THE SKIN 2 (TWO) TIMES DAILY     Endocrinology:  Diabetes - Insulins Passed - 11/12/2020  4:41 PM      Passed - HBA1C is between 0 and 7.9 and within 180 days    Hemoglobin A1C  Date Value Ref Range Status  06/02/2020 7.4 (A) 4.0 - 5.6 % Final   HbA1c, POC (controlled diabetic range)  Date Value Ref Range Status  08/27/2019 7.4 (A) 0.0 - 7.0 % Final   Hgb A1c MFr Bld  Date Value Ref Range Status  12/03/2019 8.5 (H) 4.8 - 5.6 % Final    Comment:             Prediabetes: 5.7 - 6.4          Diabetes: >6.4          Glycemic control for adults with diabetes: <7.0           Passed - Valid encounter within last 6 months    Recent Outpatient Visits           3 weeks ago Type 2 diabetes mellitus with diabetic polyneuropathy, with long-term current use of insulin (HCC)   [facility] [place], [place], MD   2 months ago Osteoarthritis of right knee, unspecified osteoarthritis type   [facility] [place], [place], MD   5 months ago Type 2 diabetes mellitus with complication, with long-term current use of insulin (HCC)   [facility] [place], [place], MD   8 months ago Type 2 diabetes mellitus with complication, with long-term current use of insulin (HCC)   [facility] [place], [place], MD   11 months ago Type 2 diabetes mellitus with complication, with long-term current use of insulin (HCC)   [facility] Hoy Register, MD

## 2020-11-13 ENCOUNTER — Other Ambulatory Visit: Payer: Self-pay

## 2020-11-13 ENCOUNTER — Other Ambulatory Visit: Payer: Self-pay | Admitting: Pharmacist

## 2020-11-13 MED ORDER — INSULIN GLARGINE-YFGN 100 UNIT/ML ~~LOC~~ SOPN
60.0000 [IU] | PEN_INJECTOR | Freq: Two times a day (BID) | SUBCUTANEOUS | 2 refills | Status: DC
  Filled 2020-11-13: qty 30, 25d supply, fill #0
  Filled 2021-01-14: qty 30, 25d supply, fill #1

## 2020-11-16 ENCOUNTER — Other Ambulatory Visit: Payer: Self-pay

## 2020-11-19 ENCOUNTER — Other Ambulatory Visit: Payer: Self-pay

## 2020-11-19 ENCOUNTER — Other Ambulatory Visit: Payer: Self-pay | Admitting: Family Medicine

## 2020-11-19 MED ORDER — INSULIN PEN NEEDLE 31G X 5 MM MISC
6 refills | Status: DC
  Filled 2020-11-19: qty 100, 20d supply, fill #0
  Filled 2021-03-12: qty 100, 20d supply, fill #1
  Filled 2021-03-12: qty 100, 20d supply, fill #0
  Filled 2021-06-22: qty 100, 20d supply, fill #1
  Filled 2021-11-05: qty 100, 20d supply, fill #2

## 2020-11-23 ENCOUNTER — Other Ambulatory Visit: Payer: Self-pay

## 2020-11-23 ENCOUNTER — Other Ambulatory Visit (HOSPITAL_COMMUNITY): Payer: Self-pay

## 2020-11-30 ENCOUNTER — Other Ambulatory Visit: Payer: Self-pay

## 2020-12-01 ENCOUNTER — Other Ambulatory Visit: Payer: Self-pay

## 2020-12-02 ENCOUNTER — Other Ambulatory Visit: Payer: Self-pay

## 2020-12-02 ENCOUNTER — Other Ambulatory Visit: Payer: Self-pay | Admitting: Family Medicine

## 2020-12-02 DIAGNOSIS — I10 Essential (primary) hypertension: Secondary | ICD-10-CM

## 2020-12-02 MED ORDER — BENAZEPRIL HCL 40 MG PO TABS
ORAL_TABLET | Freq: Every day | ORAL | 0 refills | Status: DC
  Filled 2020-12-02 – 2020-12-10 (×2): qty 90, 90d supply, fill #0

## 2020-12-02 MED ORDER — SPIRONOLACTONE 25 MG PO TABS
ORAL_TABLET | Freq: Every day | ORAL | 0 refills | Status: DC
  Filled 2020-12-02 – 2020-12-10 (×2): qty 90, 90d supply, fill #0

## 2020-12-02 NOTE — Telephone Encounter (Signed)
Requested Prescriptions  Pending Prescriptions Disp Refills  . benazepril (LOTENSIN) 40 MG tablet 90 tablet 0    Sig: TAKE 1 TABLET (40 MG TOTAL) BY MOUTH DAILY.     Cardiovascular:  ACE Inhibitors Failed - 12/02/2020 11:52 AM      Failed - Cr in normal range and within 180 days    Creat  Date Value Ref Range Status  04/26/2016 1.00 0.50 - 1.05 mg/dL Final    Comment:      For patients > or = 60 years of age: The upper reference limit for Creatinine is approximately 13% higher for people identified as African-American.      Creatinine, Ser  Date Value Ref Range Status  09/03/2020 1.25 (H) 0.57 - 1.00 mg/dL Final   Creatinine, Urine  Date Value Ref Range Status  12/23/2015 198 20 - 320 mg/dL Final         Failed - Last BP in normal range    BP Readings from Last 1 Encounters:  06/02/20 (!) 149/90         Passed - K in normal range and within 180 days    Potassium  Date Value Ref Range Status  09/03/2020 4.4 3.5 - 5.2 mmol/L Final         Passed - Patient is not pregnant      Passed - Valid encounter within last 6 months    Recent Outpatient Visits          1 month ago Type 2 diabetes mellitus with diabetic polyneuropathy, with long-term current use of insulin (HCC)   [facility] [place], [place], MD   3 months ago Osteoarthritis of right knee, unspecified osteoarthritis type   [facility] [place], [place], MD   6 months ago Type 2 diabetes mellitus with complication, with long-term current use of insulin (HCC)   [facility] [place], [place], MD   9 months ago Type 2 diabetes mellitus with complication, with long-term current use of insulin (HCC)   [facility] [place], [place], MD   1 year ago Type 2 diabetes mellitus with complication, with long-term current use of insulin (HCC)    [facility] Hoy Register, MD       Future Appointments            In 2 months Hoy Register, MD [facility]           . spironolactone (ALDACTONE) 25 MG tablet 90 tablet 0    Sig: TAKE 1 TABLET (25 MG TOTAL) BY MOUTH DAILY.     Cardiovascular: Diuretics - Aldosterone Antagonist Failed - 12/02/2020 11:52 AM      Failed - Cr in normal range and within 360 days    Creat  Date Value Ref Range Status  04/26/2016 1.00 0.50 - 1.05 mg/dL Final    Comment:      For patients > or = 59 years of age: The upper reference limit for Creatinine is approximately 13% higher for people identified as African-American.      Creatinine, Ser  Date Value Ref Range Status  09/03/2020 1.25 (H) 0.57 - 1.00 mg/dL Final   Creatinine, Urine  Date Value Ref Range Status  12/23/2015 198 20 - 320 mg/dL Final         Failed - Last BP in normal range    BP Readings from Last 1 Encounters:  06/02/20 (!) 149/90         Passed - K in normal range and within 360 days    Potassium  Date Value Ref Range Status  09/03/2020 4.4 3.5 - 5.2 mmol/L Final         Passed - Na in normal range and within 360 days    Sodium  Date Value Ref Range Status  09/03/2020 138 134 - 144 mmol/L Final         Passed - Valid encounter within last 6 months    Recent Outpatient Visits          1 month ago Type 2 diabetes mellitus with diabetic polyneuropathy, with long-term current use of insulin (HCC)   [facility] [place], [place], MD   3 months ago Osteoarthritis of right knee, unspecified osteoarthritis type   [facility] [place], [place], MD   6 months ago Type 2 diabetes mellitus with complication, with long-term current use of insulin (HCC)   [facility] [place], [place], MD   9 months ago Type 2 diabetes mellitus with complication, with long-term current use of insulin (HCC)   [facility] [place],  [place], MD   1 year ago Type 2 diabetes mellitus with complication, with long-term current use of insulin (HCC)   [facility] Hoy Register, MD      Future Appointments            In 2 months Hoy Register, MD [facility]

## 2020-12-04 ENCOUNTER — Other Ambulatory Visit: Payer: Self-pay

## 2020-12-04 ENCOUNTER — Ambulatory Visit: Payer: BC Managed Care – PPO | Attending: Family Medicine

## 2020-12-04 DIAGNOSIS — Z23 Encounter for immunization: Secondary | ICD-10-CM

## 2020-12-10 ENCOUNTER — Other Ambulatory Visit: Payer: Self-pay

## 2020-12-11 ENCOUNTER — Other Ambulatory Visit: Payer: Self-pay

## 2020-12-28 ENCOUNTER — Other Ambulatory Visit: Payer: Self-pay

## 2020-12-29 ENCOUNTER — Other Ambulatory Visit: Payer: Self-pay

## 2021-01-07 ENCOUNTER — Other Ambulatory Visit (HOSPITAL_BASED_OUTPATIENT_CLINIC_OR_DEPARTMENT_OTHER): Payer: Self-pay

## 2021-01-07 MED ORDER — AMOXICILLIN 500 MG PO CAPS
ORAL_CAPSULE | ORAL | 0 refills | Status: DC
  Filled 2021-01-07 – 2021-01-11 (×2): qty 21, 7d supply, fill #0

## 2021-01-11 ENCOUNTER — Other Ambulatory Visit (HOSPITAL_BASED_OUTPATIENT_CLINIC_OR_DEPARTMENT_OTHER): Payer: Self-pay

## 2021-01-11 ENCOUNTER — Other Ambulatory Visit: Payer: Self-pay

## 2021-01-14 ENCOUNTER — Other Ambulatory Visit: Payer: Self-pay

## 2021-01-14 MED ORDER — CLINDAMYCIN HCL 150 MG PO CAPS
ORAL_CAPSULE | ORAL | 0 refills | Status: DC
  Filled 2021-01-14 – 2021-01-29 (×2): qty 28, 7d supply, fill #0

## 2021-01-15 ENCOUNTER — Other Ambulatory Visit: Payer: Self-pay

## 2021-01-27 ENCOUNTER — Other Ambulatory Visit: Payer: Self-pay | Admitting: Family Medicine

## 2021-01-27 ENCOUNTER — Other Ambulatory Visit: Payer: Self-pay

## 2021-01-27 DIAGNOSIS — E876 Hypokalemia: Secondary | ICD-10-CM

## 2021-01-27 DIAGNOSIS — I10 Essential (primary) hypertension: Secondary | ICD-10-CM

## 2021-01-27 DIAGNOSIS — K257 Chronic gastric ulcer without hemorrhage or perforation: Secondary | ICD-10-CM

## 2021-01-28 ENCOUNTER — Other Ambulatory Visit: Payer: Self-pay

## 2021-01-28 MED ORDER — POTASSIUM CHLORIDE CRYS ER 20 MEQ PO TBCR
EXTENDED_RELEASE_TABLET | ORAL | 1 refills | Status: DC
  Filled 2021-01-28: qty 135, 90d supply, fill #0
  Filled 2021-01-29 – 2021-03-19 (×2): qty 45, 30d supply, fill #0
  Filled 2021-04-21: qty 45, 30d supply, fill #1
  Filled 2021-05-24: qty 45, 30d supply, fill #2
  Filled 2021-06-21: qty 45, 30d supply, fill #3
  Filled 2021-07-21: qty 45, 30d supply, fill #4

## 2021-01-28 MED ORDER — HYDROCHLOROTHIAZIDE 25 MG PO TABS
ORAL_TABLET | Freq: Every day | ORAL | 0 refills | Status: DC
  Filled 2021-01-28: qty 90, 90d supply, fill #0

## 2021-01-28 NOTE — Telephone Encounter (Signed)
Requested Prescriptions  Pending Prescriptions Disp Refills  . hydrochlorothiazide (HYDRODIURIL) 25 MG tablet 90 tablet 0    Sig: TAKE 1 TABLET (25 MG TOTAL) BY MOUTH DAILY.     Cardiovascular: Diuretics - Thiazide Failed - 01/27/2021  9:39 AM      Failed - Cr in normal range and within 360 days    Creat  Date Value Ref Range Status  04/26/2016 1.00 0.50 - 1.05 mg/dL Final    Comment:      For patients > or = 59 years of age: The upper reference limit for Creatinine is approximately 13% higher for people identified as African-American.      Creatinine, Ser  Date Value Ref Range Status  09/03/2020 1.25 (H) 0.57 - 1.00 mg/dL Final   Creatinine, Urine  Date Value Ref Range Status  12/23/2015 198 20 - 320 mg/dL Final         Failed - Last BP in normal range    BP Readings from Last 1 Encounters:  06/02/20 (!) 149/90         Passed - Ca in normal range and within 360 days    Calcium  Date Value Ref Range Status  09/03/2020 9.8 8.7 - 10.2 mg/dL Final   Calcium, Ion  Date Value Ref Range Status  01/01/2012 1.21 1.12 - 1.23 mmol/L Final         Passed - K in normal range and within 360 days    Potassium  Date Value Ref Range Status  09/03/2020 4.4 3.5 - 5.2 mmol/L Final         Passed - Na in normal range and within 360 days    Sodium  Date Value Ref Range Status  09/03/2020 138 134 - 144 mmol/L Final         Passed - Valid encounter within last 6 months    Recent Outpatient Visits          3 months ago Type 2 diabetes mellitus with diabetic polyneuropathy, with long-term current use of insulin (HCC)   [facility] [place], [place], MD   4 months ago Osteoarthritis of right knee, unspecified osteoarthritis type   [facility] [place], [place], MD   8 months ago Type 2 diabetes mellitus with complication, with long-term current use of insulin (HCC)   [facility] [place], [place],  MD   11 months ago Type 2 diabetes mellitus with complication, with long-term current use of insulin (HCC)   [facility] [place], [place], MD   1 year ago Type 2 diabetes mellitus with complication, with long-term current use of insulin (HCC)   [facility] Hoy Register, MD      Future Appointments            In 6 days Hoy Register, MD [facility]           . potassium chloride SA (KLOR-CON M) 20 MEQ tablet 135 tablet 1    Sig: TAKE 1.5 TABLETS (30 MEQ TOTAL) BY MOUTH DAILY.     Endocrinology:  Minerals - Potassium Supplementation Failed - 01/27/2021  9:39 AM      Failed - Cr in normal range and within 360 days    Creat  Date Value Ref Range Status  04/26/2016 1.00 0.50 - 1.05 mg/dL Final    Comment:      For patients >  or = 59 years of age: The upper reference limit for Creatinine is approximately 13% higher for people identified as African-American.      Creatinine, Ser  Date Value Ref Range Status  09/03/2020 1.25 (H) 0.57 - 1.00 mg/dL Final   Creatinine, Urine  Date Value Ref Range Status  12/23/2015 198 20 - 320 mg/dL Final         Passed - K in normal range and within 360 days    Potassium  Date Value Ref Range Status  09/03/2020 4.4 3.5 - 5.2 mmol/L Final         Passed - Valid encounter within last 12 months    Recent Outpatient Visits          3 months ago Type 2 diabetes mellitus with diabetic polyneuropathy, with long-term current use of insulin (HCC)   [facility] [place], [place], MD   4 months ago Osteoarthritis of right knee, unspecified osteoarthritis type   [facility] [place], [place], MD   8 months ago Type 2 diabetes mellitus with complication, with long-term current use of insulin (HCC)   [facility] [place], [place], MD   11 months ago Type 2 diabetes mellitus  with complication, with long-term current use of insulin (HCC)   [facility] [place], [place], MD   1 year ago Type 2 diabetes mellitus with complication, with long-term current use of insulin (HCC)   [facility] Hoy Register, MD      Future Appointments            In 6 days Hoy Register, MD [facility]

## 2021-01-29 ENCOUNTER — Other Ambulatory Visit: Payer: Self-pay

## 2021-01-29 MED ORDER — CHLORHEXIDINE GLUCONATE 0.12 % MT SOLN
OROMUCOSAL | 0 refills | Status: DC
  Filled 2021-01-29: qty 473, 14d supply, fill #0

## 2021-02-03 ENCOUNTER — Ambulatory Visit: Payer: BC Managed Care – PPO | Attending: Family Medicine | Admitting: Family Medicine

## 2021-02-03 ENCOUNTER — Encounter: Payer: Self-pay | Admitting: Family Medicine

## 2021-02-03 ENCOUNTER — Other Ambulatory Visit: Payer: Self-pay

## 2021-02-03 VITALS — BP 142/82 | HR 86 | Wt 184.0 lb

## 2021-02-03 DIAGNOSIS — R1031 Right lower quadrant pain: Secondary | ICD-10-CM | POA: Diagnosis not present

## 2021-02-03 DIAGNOSIS — I10 Essential (primary) hypertension: Secondary | ICD-10-CM | POA: Diagnosis not present

## 2021-02-03 DIAGNOSIS — Z794 Long term (current) use of insulin: Secondary | ICD-10-CM | POA: Diagnosis not present

## 2021-02-03 DIAGNOSIS — E1142 Type 2 diabetes mellitus with diabetic polyneuropathy: Secondary | ICD-10-CM | POA: Diagnosis not present

## 2021-02-03 DIAGNOSIS — Z23 Encounter for immunization: Secondary | ICD-10-CM

## 2021-02-03 DIAGNOSIS — K257 Chronic gastric ulcer without hemorrhage or perforation: Secondary | ICD-10-CM | POA: Diagnosis not present

## 2021-02-03 DIAGNOSIS — Z1231 Encounter for screening mammogram for malignant neoplasm of breast: Secondary | ICD-10-CM

## 2021-02-03 LAB — POCT GLYCOSYLATED HEMOGLOBIN (HGB A1C): HbA1c, POC (controlled diabetic range): 9.6 % — AB (ref 0.0–7.0)

## 2021-02-03 LAB — GLUCOSE, POCT (MANUAL RESULT ENTRY): POC Glucose: 265 mg/dl — AB (ref 70–99)

## 2021-02-03 MED ORDER — DICLOFENAC SODIUM 1 % EX GEL
4.0000 g | Freq: Four times a day (QID) | CUTANEOUS | 1 refills | Status: AC
Start: 2021-02-03 — End: ?
  Filled 2021-02-03: qty 100, 6d supply, fill #0

## 2021-02-03 MED ORDER — GLIMEPIRIDE 4 MG PO TABS
ORAL_TABLET | ORAL | 1 refills | Status: DC
  Filled 2021-02-03: qty 180, fill #0
  Filled 2021-02-18: qty 180, 90d supply, fill #0
  Filled 2021-02-18: qty 60, 30d supply, fill #0
  Filled 2021-05-03: qty 60, 30d supply, fill #1
  Filled 2021-05-03: qty 60, 30d supply, fill #0
  Filled 2021-06-07: qty 60, 30d supply, fill #1
  Filled 2021-07-14: qty 60, 30d supply, fill #2

## 2021-02-03 MED ORDER — CARVEDILOL 25 MG PO TABS
ORAL_TABLET | Freq: Two times a day (BID) | ORAL | 1 refills | Status: DC
  Filled 2021-02-03 – 2021-04-01 (×2): qty 180, fill #0
  Filled 2021-04-01: qty 180, 90d supply, fill #0
  Filled 2021-08-22: qty 180, 90d supply, fill #1

## 2021-02-03 MED ORDER — FLUCONAZOLE 150 MG PO TABS
150.0000 mg | ORAL_TABLET | Freq: Once | ORAL | 1 refills | Status: AC
  Filled 2021-02-03: qty 2, 2d supply, fill #0

## 2021-02-03 MED ORDER — SPIRONOLACTONE 25 MG PO TABS
ORAL_TABLET | Freq: Every day | ORAL | 1 refills | Status: DC
  Filled 2021-02-03: qty 90, fill #0
  Filled 2021-03-12: qty 90, 90d supply, fill #0
  Filled 2021-03-12: qty 90, fill #0
  Filled 2021-06-07: qty 90, 90d supply, fill #1

## 2021-02-03 MED ORDER — INSULIN GLARGINE-YFGN 100 UNIT/ML ~~LOC~~ SOPN: 63.0000 [IU] | PEN_INJECTOR | Freq: Two times a day (BID) | SUBCUTANEOUS | 2 refills | Status: DC

## 2021-02-03 MED ORDER — OMEPRAZOLE 20 MG PO CPDR
20.0000 mg | DELAYED_RELEASE_CAPSULE | Freq: Every day | ORAL | 1 refills | Status: AC
  Filled 2021-02-03: qty 30, 30d supply, fill #0

## 2021-02-03 MED ORDER — BENAZEPRIL HCL 40 MG PO TABS
ORAL_TABLET | Freq: Every day | ORAL | 1 refills | Status: DC
  Filled 2021-02-03: qty 90, fill #0
  Filled 2021-03-12: qty 90, 90d supply, fill #0
  Filled 2021-03-12: qty 90, fill #0
  Filled 2021-06-07: qty 90, 90d supply, fill #1

## 2021-02-03 MED ORDER — HYDROCHLOROTHIAZIDE 25 MG PO TABS
ORAL_TABLET | Freq: Every day | ORAL | 1 refills | Status: DC
  Filled 2021-02-03: qty 90, fill #0
  Filled 2021-04-21: qty 90, 90d supply, fill #0
  Filled 2021-04-21: qty 90, fill #0
  Filled 2021-07-15: qty 90, 90d supply, fill #1

## 2021-02-03 NOTE — [place] Notes (Signed)
Right inner thigh has pain after sitting.
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
## 2021-02-04 LAB — BASIC METABOLIC PANEL
BUN/Creatinine Ratio: 15 (ref 9–23)
BUN: 19 mg/dL (ref 6–24)
CO2: 26 mmol/L (ref 20–29)
Calcium: 9.2 mg/dL (ref 8.7–10.2)
Chloride: 100 mmol/L (ref 96–106)
Creatinine, Ser: 1.3 mg/dL — ABNORMAL HIGH (ref 0.57–1.00)
Glucose: 238 mg/dL — ABNORMAL HIGH (ref 70–99)
Potassium: 3.9 mmol/L (ref 3.5–5.2)
Sodium: 140 mmol/L (ref 134–144)
eGFR: 47 mL/min/{1.73_m2} — ABNORMAL LOW (ref >59)

## 2021-02-18 ENCOUNTER — Other Ambulatory Visit: Payer: Self-pay

## 2021-02-18 MED ORDER — CHLORHEXIDINE GLUCONATE 0.12 % MT SOLN
OROMUCOSAL | 0 refills | Status: DC
  Filled 2021-02-18: qty 473, 14d supply, fill #0

## 2021-03-02 ENCOUNTER — Ambulatory Visit (HOSPITAL_COMMUNITY)
Admission: RE | Admit: 2021-03-02 | Discharge: 2021-03-02 | Disposition: A | Discharge status: Home / Self Care | Payer: BC Managed Care – PPO | Source: Ambulatory Visit | Attending: Family Medicine | Admitting: Family Medicine

## 2021-03-02 ENCOUNTER — Other Ambulatory Visit: Payer: Self-pay

## 2021-03-02 DIAGNOSIS — R1031 Right lower quadrant pain: Secondary | ICD-10-CM | POA: Diagnosis not present

## 2021-03-02 DIAGNOSIS — R102 Pelvic and perineal pain: Secondary | ICD-10-CM | POA: Diagnosis not present

## 2021-03-08 ENCOUNTER — Other Ambulatory Visit: Payer: Self-pay | Admitting: Family Medicine

## 2021-03-08 ENCOUNTER — Encounter: Payer: Self-pay | Admitting: Family Medicine

## 2021-03-08 DIAGNOSIS — R1031 Right lower quadrant pain: Secondary | ICD-10-CM

## 2021-03-12 ENCOUNTER — Other Ambulatory Visit: Payer: Self-pay | Admitting: Pharmacist

## 2021-03-12 ENCOUNTER — Other Ambulatory Visit: Payer: Self-pay

## 2021-03-12 MED ORDER — INSULIN GLARGINE-YFGN 100 UNIT/ML ~~LOC~~ SOPN
63.0000 [IU] | PEN_INJECTOR | Freq: Two times a day (BID) | SUBCUTANEOUS | 2 refills | Status: DC
  Filled 2021-03-12: qty 30, 24d supply, fill #0
  Filled 2021-04-21: qty 30, 24d supply, fill #1
  Filled 2021-05-24: qty 30, 24d supply, fill #2

## 2021-03-15 ENCOUNTER — Other Ambulatory Visit: Payer: Self-pay

## 2021-03-18 ENCOUNTER — Other Ambulatory Visit: Payer: Self-pay | Admitting: Family Medicine

## 2021-03-18 ENCOUNTER — Other Ambulatory Visit: Payer: Self-pay | Admitting: Pharmacist

## 2021-03-18 ENCOUNTER — Other Ambulatory Visit: Payer: Self-pay

## 2021-03-18 ENCOUNTER — Encounter: Payer: Self-pay | Admitting: Family Medicine

## 2021-03-18 ENCOUNTER — Ambulatory Visit: Payer: BC Managed Care – PPO | Admitting: Orthopaedic Surgery

## 2021-03-18 ENCOUNTER — Encounter: Payer: Self-pay | Admitting: Orthopaedic Surgery

## 2021-03-18 VITALS — Ht 65.0 in | Wt 182.0 lb

## 2021-03-18 DIAGNOSIS — J4520 Mild intermittent asthma, uncomplicated: Secondary | ICD-10-CM

## 2021-03-18 DIAGNOSIS — J40 Bronchitis, not specified as acute or chronic: Secondary | ICD-10-CM

## 2021-03-18 DIAGNOSIS — M25551 Pain in right hip: Secondary | ICD-10-CM | POA: Diagnosis not present

## 2021-03-18 DIAGNOSIS — M1711 Unilateral primary osteoarthritis, right knee: Secondary | ICD-10-CM

## 2021-03-18 DIAGNOSIS — M62838 Other muscle spasm: Secondary | ICD-10-CM

## 2021-03-18 MED ORDER — CYCLOBENZAPRINE HCL 10 MG PO TABS
10.0000 mg | ORAL_TABLET | Freq: Two times a day (BID) | ORAL | 2 refills | Status: DC | PRN
  Filled 2021-03-18: qty 60, 30d supply, fill #0

## 2021-03-18 MED ORDER — ALBUTEROL SULFATE HFA 108 (90 BASE) MCG/ACT IN AERS
INHALATION_SPRAY | RESPIRATORY_TRACT | 2 refills | Status: DC
  Filled 2021-03-18: qty 8.5, 25d supply, fill #0

## 2021-03-18 MED ORDER — PREDNISONE 20 MG PO TABS
20.0000 mg | ORAL_TABLET | Freq: Every day | ORAL | 0 refills | Status: DC
  Filled 2021-03-18: qty 5, 5d supply, fill #0
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
## 2021-03-19 ENCOUNTER — Other Ambulatory Visit: Payer: Self-pay

## 2021-03-22 ENCOUNTER — Encounter: Payer: Self-pay | Admitting: Physical Medicine and Rehabilitation

## 2021-03-22 ENCOUNTER — Other Ambulatory Visit: Payer: Self-pay

## 2021-03-22 ENCOUNTER — Ambulatory Visit (INDEPENDENT_AMBULATORY_CARE_PROVIDER_SITE_OTHER): Payer: BC Managed Care – PPO | Admitting: Physical Medicine and Rehabilitation

## 2021-03-22 ENCOUNTER — Ambulatory Visit: Payer: Self-pay

## 2021-03-22 DIAGNOSIS — M25551 Pain in right hip: Secondary | ICD-10-CM | POA: Diagnosis not present

## 2021-03-22 NOTE — Progress Notes (Signed)
° °  [name] - 61 y.o. female MRN [id number]  Date of birth: 1960/06/01  Office Visit Note: Visit Date: 03/22/2021 PCP: Hoy Register, MD Referred by: Hoy Register, MD  Subjective: Chief Complaint  Patient presents with   Right Hip - Pain   HPI:  Kelly Santana is a 61 y.o. female who comes in today at the request of Dr. Glee Arvin for planned Right anesthetic hip arthrogram with fluoroscopic guidance.  The patient has failed conservative care including home exercise, medications, time and activity modification.  This injection will be diagnostic and hopefully therapeutic.  Please see requesting physician notes for further details and justification.   ROS Otherwise per HPI.  Assessment & Plan: Visit Diagnoses:    ICD-10-CM   1. Pain in right hip  M25.551 Large Joint Inj: R hip joint    XR C-ARM NO REPORT      Plan: No additional findings.   Meds & Orders: No orders of the defined types were placed in this encounter.   Orders Placed This Encounter  Procedures   Large Joint Inj: R hip joint   XR C-ARM NO REPORT    Follow-up: Return for visit to requesting provider as needed.   Procedures: Large Joint Inj: R hip joint on 03/22/2021 8:21 AM Indications: diagnostic evaluation and pain Details: 22 G 3.5 in needle, fluoroscopy-guided anterior approach  Arthrogram: No  Medications: 4 mL bupivacaine 0.25 %; 60 mg triamcinolone acetonide 40 MG/ML Outcome: tolerated well, no immediate complications  There was excellent flow of contrast producing a partial arthrogram of the hip. The patient did have relief of symptoms during the anesthetic phase of the injection. Procedure, treatment alternatives, risks and benefits explained, specific risks discussed. Consent was given by the patient. Immediately prior to procedure a time out was called to verify the correct patient, procedure, equipment, support staff and site/side marked as required. Patient was prepped and draped in  the usual sterile fashion.         Clinical History: No specialty comments available.     Objective:  VS:  HT:     WT:    BMI:      BP:    HR: bpm   TEMP: ( )   RESP:  Physical Exam   Imaging: No results found.

## 2021-03-22 NOTE — Progress Notes (Signed)
Pt state right hip pain. Pt state sitting and getting up from a sitting position makes the pain worse. Pt state she takes over the counter pain meds to help ease her pain.  Numeric Pain Rating Scale and Functional Assessment Average Pain 6   In the last MONTH (on 0-10 scale) has pain interfered with the following?  1. General activity like being  able to carry out your everyday physical activities such as walking, climbing stairs, carrying groceries, or moving a chair?  Rating(10)   -BT, -Dye Allergies.

## 2021-03-23 MED ORDER — TRIAMCINOLONE ACETONIDE 40 MG/ML IJ SUSP
60.0000 mg | INTRAMUSCULAR | Status: AC | PRN
  Administered 2021-03-22: 08:00:00 60 mg via INTRA_ARTICULAR

## 2021-03-23 MED ORDER — BUPIVACAINE HCL 0.25 % IJ SOLN
4.0000 mL | INTRAMUSCULAR | Status: AC | PRN
  Administered 2021-03-22: 08:00:00 4 mL via INTRA_ARTICULAR

## 2021-04-01 ENCOUNTER — Other Ambulatory Visit: Payer: Self-pay

## 2021-04-21 ENCOUNTER — Other Ambulatory Visit: Payer: Self-pay

## 2021-04-22 ENCOUNTER — Other Ambulatory Visit: Payer: Self-pay

## 2021-05-03 ENCOUNTER — Other Ambulatory Visit: Payer: Self-pay

## 2021-05-04 ENCOUNTER — Other Ambulatory Visit: Payer: Self-pay

## 2021-05-06 ENCOUNTER — Encounter: Payer: Self-pay | Admitting: Family Medicine

## 2021-05-06 ENCOUNTER — Other Ambulatory Visit: Payer: Self-pay

## 2021-05-06 ENCOUNTER — Ambulatory Visit: Payer: BC Managed Care – PPO | Attending: Family Medicine | Admitting: Family Medicine

## 2021-05-06 VITALS — BP 124/82 | HR 89 | Ht 65.0 in | Wt 181.0 lb

## 2021-05-06 DIAGNOSIS — E1169 Type 2 diabetes mellitus with other specified complication: Secondary | ICD-10-CM

## 2021-05-06 DIAGNOSIS — Z794 Long term (current) use of insulin: Secondary | ICD-10-CM

## 2021-05-06 DIAGNOSIS — E785 Hyperlipidemia, unspecified: Secondary | ICD-10-CM

## 2021-05-06 DIAGNOSIS — E1142 Type 2 diabetes mellitus with diabetic polyneuropathy: Secondary | ICD-10-CM | POA: Diagnosis not present

## 2021-05-06 DIAGNOSIS — E1159 Type 2 diabetes mellitus with other circulatory complications: Secondary | ICD-10-CM

## 2021-05-06 DIAGNOSIS — J4520 Mild intermittent asthma, uncomplicated: Secondary | ICD-10-CM

## 2021-05-06 DIAGNOSIS — I152 Hypertension secondary to endocrine disorders: Secondary | ICD-10-CM

## 2021-05-06 LAB — POCT GLYCOSYLATED HEMOGLOBIN (HGB A1C): HbA1c, POC (controlled diabetic range): 9.7 % — AB (ref 0.0–7.0)

## 2021-05-06 LAB — GLUCOSE, POCT (MANUAL RESULT ENTRY): POC Glucose: 353 mg/dl — AB (ref 70–99)

## 2021-05-06 MED ORDER — GABAPENTIN 300 MG PO CAPS
600.0000 mg | ORAL_CAPSULE | Freq: Two times a day (BID) | ORAL | 1 refills | Status: DC
  Filled 2021-05-06: qty 360, 90d supply, fill #0
  Filled 2021-07-22: qty 120, 30d supply, fill #0
  Filled 2021-09-10: qty 120, 30d supply, fill #1
  Filled 2021-11-28: qty 120, 30d supply, fill #2
  Filled 2022-01-31: qty 120, 30d supply, fill #3

## 2021-05-06 NOTE — Patient Instructions (Signed)
Exercising to Stay Healthy °To become healthy and stay healthy, it is recommended that you do moderate-intensity and vigorous-intensity exercise. You can tell that you are exercising at a moderate intensity if your heart starts beating faster and you start breathing faster but can still hold a conversation. You can tell that you are exercising at a vigorous intensity if you are breathing much harder and faster and cannot hold a conversation while exercising. °How can exercise benefit me? °Exercising regularly is important. It has many health benefits, such as: °Improving overall fitness, flexibility, and endurance. °Increasing bone density. °Helping with weight control. °Decreasing body fat. °Increasing muscle strength and endurance. °Reducing stress and tension, anxiety, depression, or anger. °Improving overall health. °What guidelines should I follow while exercising? °Before you start a new exercise program, talk with your health care provider. °Do not exercise so much that you hurt yourself, feel dizzy, or get very short of breath. °Wear comfortable clothes and wear shoes with good support. °Drink plenty of water while you exercise to prevent dehydration or heat stroke. °Work out until your breathing and your heartbeat get faster (moderate intensity). °How often should I exercise? °Choose an activity that you enjoy, and set realistic goals. Your health care provider can help you make an activity plan that is individually designed and works best for you. °Exercise regularly as told by your health care provider. This may include: °Doing strength training two times a week, such as: °Lifting weights. °Using resistance bands. °Push-ups. °Sit-ups. °Yoga. °Doing a certain intensity of exercise for a given amount of time. Choose from these options: °A total of 150 minutes of moderate-intensity exercise every week. °A total of 75 minutes of vigorous-intensity exercise every week. °A mix of moderate-intensity and  vigorous-intensity exercise every week. °Children, pregnant women, people who have not exercised regularly, people who are overweight, and older adults may need to talk with a health care provider about what activities are safe to perform. If you have a medical condition, be sure to talk with your health care provider before you start a new exercise program. °What are some exercise ideas? °Moderate-intensity exercise ideas include: °Walking 1 mile (1.6 km) in about 15 minutes. °Biking. °Hiking. °Golfing. °Dancing. °Water aerobics. °Vigorous-intensity exercise ideas include: °Walking 4.5 miles (7.2 km) or more in about 1 hour. °Jogging or running 5 miles (8 km) in about 1 hour. °Biking 10 miles (16.1 km) or more in about 1 hour. °Lap swimming. °Roller-skating or in-line skating. °Cross-country skiing. °Vigorous competitive sports, such as football, basketball, and soccer. °Jumping rope. °Aerobic dancing. °What are some everyday activities that can help me get exercise? °Yard work, such as: °Pushing a lawn mower. °Raking and bagging leaves. °Washing your car. °Pushing a stroller. °Shoveling snow. °Gardening. °Washing windows or floors. °How can I be more active in my day-to-day activities? °Use stairs instead of an elevator. °Take a walk during your lunch break. °If you drive, park your car farther away from your work or school. °If you take public transportation, get off one stop early and walk the rest of the way. °Stand up or walk around during all of your indoor phone calls. °Get up, stretch, and walk around every 30 minutes throughout the day. °Enjoy exercise with a friend. Support to continue exercising will help you keep a regular routine of activity. °Where to find more information °You can find more information about exercising to stay healthy from: °[place] Department of Health and Human Services: www.hhs.gov °Centers for Disease Control and Prevention (  CDC): www.cdc.gov °Summary °Exercising regularly is  important. It will improve your overall fitness, flexibility, and endurance. °Regular exercise will also improve your overall health. It can help you control your weight, reduce stress, and improve your bone density. °Do not exercise so much that you hurt yourself, feel dizzy, or get very short of breath. °Before you start a new exercise program, talk with your health care provider. °This information is not intended to replace advice given to you by your health care provider. Make sure you discuss any questions you have with your health care provider. °Document Revised: 06/12/2020 Document Reviewed: 06/12/2020 °Elsevier Patient Education © 2022 [employer] ° °
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
## 2021-05-07 ENCOUNTER — Other Ambulatory Visit: Payer: Self-pay | Admitting: Family Medicine

## 2021-05-07 ENCOUNTER — Other Ambulatory Visit: Payer: Self-pay

## 2021-05-07 LAB — LP+NON-HDL CHOLESTEROL
Cholesterol, Total: 186 mg/dL (ref 100–199)
HDL: 36 mg/dL — ABNORMAL LOW (ref >39)
LDL Chol Calc (NIH): 58 mg/dL (ref 0–99)
Total Non-HDL-Chol (LDL+VLDL): 150 mg/dL — ABNORMAL HIGH (ref 0–129)
Triglycerides: 617 mg/dL (ref 0–149)
VLDL Cholesterol Cal: 92 mg/dL — ABNORMAL HIGH (ref 5–40)

## 2021-05-07 LAB — MICROALBUMIN / CREATININE URINE RATIO
Creatinine, Urine: 80 mg/dL
Microalb/Creat Ratio: 49 mg/g creat — ABNORMAL HIGH (ref 0–29)
Microalbumin, Urine: 39.4 ug/mL

## 2021-05-07 LAB — CMP14+EGFR
ALT: 17 IU/L (ref 0–32)
AST: 18 IU/L (ref 0–40)
Albumin/Globulin Ratio: 1.7 (ref 1.2–2.2)
Albumin: 4.5 g/dL (ref 3.8–4.9)
Alkaline Phosphatase: 79 IU/L (ref 44–121)
BUN/Creatinine Ratio: 16 (ref 9–23)
BUN: 19 mg/dL (ref 6–24)
Bilirubin Total: 0.3 mg/dL (ref 0.0–1.2)
CO2: 20 mmol/L (ref 20–29)
Calcium: 9.4 mg/dL (ref 8.7–10.2)
Chloride: 96 mmol/L (ref 96–106)
Creatinine, Ser: 1.2 mg/dL — ABNORMAL HIGH (ref 0.57–1.00)
Globulin, Total: 2.6 g/dL (ref 1.5–4.5)
Glucose: 363 mg/dL — ABNORMAL HIGH (ref 70–99)
Potassium: 4.6 mmol/L (ref 3.5–5.2)
Sodium: 137 mmol/L (ref 134–144)
Total Protein: 7.1 g/dL (ref 6.0–8.5)
eGFR: 52 mL/min/{1.73_m2} — ABNORMAL LOW (ref >59)

## 2021-05-07 MED ORDER — ATORVASTATIN CALCIUM 40 MG PO TABS
40.0000 mg | ORAL_TABLET | Freq: Every day | ORAL | 1 refills | Status: DC
  Filled 2021-05-07: qty 90, 90d supply, fill #0
  Filled 2021-08-03: qty 90, 90d supply, fill #1

## 2021-05-08 ENCOUNTER — Encounter: Payer: Self-pay | Admitting: Family Medicine

## 2021-05-10 ENCOUNTER — Other Ambulatory Visit: Payer: Self-pay | Admitting: Family Medicine

## 2021-05-10 ENCOUNTER — Other Ambulatory Visit: Payer: Self-pay

## 2021-05-10 MED ORDER — INSULIN LISPRO (1 UNIT DIAL) 100 UNIT/ML (KWIKPEN)
0.0000 [IU] | PEN_INJECTOR | Freq: Three times a day (TID) | SUBCUTANEOUS | 11 refills | Status: DC
  Filled 2021-05-10: qty 15, 42d supply, fill #0

## 2021-05-11 ENCOUNTER — Other Ambulatory Visit: Payer: Self-pay

## 2021-05-11 DIAGNOSIS — E1159 Type 2 diabetes mellitus with other circulatory complications: Secondary | ICD-10-CM | POA: Diagnosis not present

## 2021-05-11 DIAGNOSIS — I10 Essential (primary) hypertension: Secondary | ICD-10-CM | POA: Diagnosis not present

## 2021-05-11 DIAGNOSIS — K3184 Gastroparesis: Secondary | ICD-10-CM | POA: Diagnosis not present

## 2021-05-11 DIAGNOSIS — E119 Type 2 diabetes mellitus without complications: Secondary | ICD-10-CM | POA: Diagnosis not present

## 2021-05-12 ENCOUNTER — Encounter: Payer: Self-pay | Admitting: Family Medicine

## 2021-05-24 ENCOUNTER — Other Ambulatory Visit: Payer: Self-pay

## 2021-05-25 ENCOUNTER — Other Ambulatory Visit: Payer: Self-pay

## 2021-05-31 DIAGNOSIS — Z630 Problems in relationship with spouse or partner: Secondary | ICD-10-CM | POA: Diagnosis not present

## 2021-06-07 ENCOUNTER — Other Ambulatory Visit: Payer: Self-pay

## 2021-06-13 ENCOUNTER — Encounter: Payer: Self-pay | Admitting: Family Medicine

## 2021-06-14 ENCOUNTER — Ambulatory Visit
Admission: RE | Admit: 2021-06-14 | Discharge: 2021-06-14 | Disposition: A | Discharge status: Home / Self Care | Payer: BC Managed Care – PPO | Source: Ambulatory Visit | Attending: Family Medicine | Admitting: Family Medicine

## 2021-06-14 ENCOUNTER — Ambulatory Visit: Payer: BC Managed Care – PPO | Attending: Family Medicine | Admitting: Family Medicine

## 2021-06-14 ENCOUNTER — Encounter: Payer: Self-pay | Admitting: Family Medicine

## 2021-06-14 VITALS — BP 151/89 | HR 87 | Resp 16 | Wt 188.0 lb

## 2021-06-14 DIAGNOSIS — L603 Nail dystrophy: Secondary | ICD-10-CM | POA: Diagnosis not present

## 2021-06-14 DIAGNOSIS — M19071 Primary osteoarthritis, right ankle and foot: Secondary | ICD-10-CM | POA: Diagnosis not present

## 2021-06-14 DIAGNOSIS — R252 Cramp and spasm: Secondary | ICD-10-CM

## 2021-06-14 DIAGNOSIS — M5432 Sciatica, left side: Secondary | ICD-10-CM

## 2021-06-14 DIAGNOSIS — R251 Tremor, unspecified: Secondary | ICD-10-CM

## 2021-06-14 DIAGNOSIS — M79671 Pain in right foot: Secondary | ICD-10-CM | POA: Diagnosis not present

## 2021-06-14 NOTE — Patient Instructions (Signed)
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
## 2021-06-15 ENCOUNTER — Encounter: Payer: Self-pay | Admitting: Family Medicine

## 2021-06-15 ENCOUNTER — Other Ambulatory Visit: Payer: Self-pay | Admitting: Family Medicine

## 2021-06-15 ENCOUNTER — Other Ambulatory Visit: Payer: Self-pay

## 2021-06-15 LAB — T4, FREE: Free T4: 1.12 ng/dL (ref 0.82–1.77)

## 2021-06-15 LAB — TSH: TSH: 0.553 u[IU]/mL (ref 0.450–4.500)

## 2021-06-15 LAB — POTASSIUM: Potassium: 4 mmol/L (ref 3.5–5.2)

## 2021-06-15 LAB — MAGNESIUM: Magnesium: 1.2 mg/dL — ABNORMAL LOW (ref 1.6–2.3)

## 2021-06-15 MED ORDER — MAGNESIUM OXIDE 400 MG PO TABS
400.0000 mg | ORAL_TABLET | Freq: Two times a day (BID) | ORAL | 3 refills | Status: DC
  Filled 2021-06-15: qty 60, 30d supply, fill #0
  Filled 2021-07-21: qty 60, 30d supply, fill #1
  Filled 2021-09-28: qty 60, 30d supply, fill #2
  Filled 2021-11-18: qty 60, 30d supply, fill #3

## 2021-06-16 ENCOUNTER — Ambulatory Visit: Payer: BC Managed Care – PPO | Attending: Family Medicine

## 2021-06-16 DIAGNOSIS — M5432 Sciatica, left side: Secondary | ICD-10-CM | POA: Insufficient documentation

## 2021-06-16 DIAGNOSIS — R2689 Other abnormalities of gait and mobility: Secondary | ICD-10-CM | POA: Diagnosis not present

## 2021-06-16 DIAGNOSIS — M5459 Other low back pain: Secondary | ICD-10-CM | POA: Insufficient documentation

## 2021-06-16 DIAGNOSIS — M6281 Muscle weakness (generalized): Secondary | ICD-10-CM | POA: Insufficient documentation
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
## 2021-06-21 ENCOUNTER — Other Ambulatory Visit: Payer: Self-pay

## 2021-06-22 ENCOUNTER — Other Ambulatory Visit: Payer: Self-pay

## 2021-06-23 ENCOUNTER — Other Ambulatory Visit: Payer: Self-pay

## 2021-06-23 ENCOUNTER — Ambulatory Visit (INDEPENDENT_AMBULATORY_CARE_PROVIDER_SITE_OTHER): Payer: BC Managed Care – PPO

## 2021-06-23 ENCOUNTER — Ambulatory Visit: Payer: BC Managed Care – PPO | Admitting: Podiatry

## 2021-06-23 DIAGNOSIS — E119 Type 2 diabetes mellitus without complications: Secondary | ICD-10-CM

## 2021-06-23 DIAGNOSIS — S9031XA Contusion of right foot, initial encounter: Secondary | ICD-10-CM
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
## 2021-06-26 ENCOUNTER — Ambulatory Visit: Payer: BC Managed Care – PPO

## 2021-06-26 DIAGNOSIS — R2689 Other abnormalities of gait and mobility: Secondary | ICD-10-CM | POA: Diagnosis not present

## 2021-06-26 DIAGNOSIS — M5459 Other low back pain: Secondary | ICD-10-CM | POA: Diagnosis not present

## 2021-06-26 DIAGNOSIS — M6281 Muscle weakness (generalized): Secondary | ICD-10-CM | POA: Diagnosis not present

## 2021-06-26 DIAGNOSIS — M5432 Sciatica, left side: Secondary | ICD-10-CM | POA: Diagnosis not present

## 2021-06-28 ENCOUNTER — Ambulatory Visit: Payer: BC Managed Care – PPO

## 2021-06-29 ENCOUNTER — Emergency Department (HOSPITAL_COMMUNITY): Payer: BC Managed Care – PPO

## 2021-06-29 ENCOUNTER — Other Ambulatory Visit: Payer: Self-pay

## 2021-06-29 ENCOUNTER — Encounter (HOSPITAL_COMMUNITY): Payer: Self-pay | Admitting: Emergency Medicine

## 2021-06-29 ENCOUNTER — Emergency Department (HOSPITAL_COMMUNITY)
Admission: EM | Admit: 2021-06-29 | Discharge: 2021-06-29 | Disposition: A | Discharge status: Home / Self Care | Payer: BC Managed Care – PPO | Attending: Emergency Medicine | Admitting: Emergency Medicine

## 2021-06-29 DIAGNOSIS — R0602 Shortness of breath: Secondary | ICD-10-CM | POA: Insufficient documentation

## 2021-06-29 DIAGNOSIS — R1110 Vomiting, unspecified: Secondary | ICD-10-CM | POA: Diagnosis not present

## 2021-06-29 DIAGNOSIS — Z20822 Contact with and (suspected) exposure to covid-19: Secondary | ICD-10-CM | POA: Insufficient documentation

## 2021-06-29 DIAGNOSIS — E119 Type 2 diabetes mellitus without complications: Secondary | ICD-10-CM | POA: Insufficient documentation

## 2021-06-29 DIAGNOSIS — Z794 Long term (current) use of insulin: Secondary | ICD-10-CM | POA: Insufficient documentation

## 2021-06-29 DIAGNOSIS — R0981 Nasal congestion: Secondary | ICD-10-CM | POA: Diagnosis not present

## 2021-06-29 DIAGNOSIS — R051 Acute cough: Secondary | ICD-10-CM | POA: Insufficient documentation

## 2021-06-29 DIAGNOSIS — R0600 Dyspnea, unspecified: Secondary | ICD-10-CM | POA: Diagnosis not present

## 2021-06-29 DIAGNOSIS — R059 Cough, unspecified: Secondary | ICD-10-CM | POA: Diagnosis not present

## 2021-06-29 LAB — CBC WITH DIFFERENTIAL/PLATELET
Abs Immature Granulocytes: 0.01 10*3/uL (ref 0.00–0.07)
Basophils Absolute: 0 10*3/uL (ref 0.0–0.1)
Basophils Relative: 0 %
Eosinophils Absolute: 0.2 10*3/uL (ref 0.0–0.5)
Eosinophils Relative: 4 %
HCT: 41.3 % (ref 36.0–46.0)
Hemoglobin: 14 g/dL (ref 12.0–15.0)
Immature Granulocytes: 0 %
Lymphocytes Relative: 37 %
Lymphs Abs: 1.9 10*3/uL (ref 0.7–4.0)
MCH: 31.7 pg (ref 26.0–34.0)
MCHC: 33.9 g/dL (ref 30.0–36.0)
MCV: 93.7 fL (ref 80.0–100.0)
Monocytes Absolute: 0.6 10*3/uL (ref 0.1–1.0)
Monocytes Relative: 10 %
Neutro Abs: 2.6 10*3/uL (ref 1.7–7.7)
Neutrophils Relative %: 49 %
Platelets: 198 10*3/uL (ref 150–400)
RBC: 4.41 MIL/uL (ref 3.87–5.11)
RDW: 11.7 % (ref 11.5–15.5)
WBC: 5.3 10*3/uL (ref 4.0–10.5)
nRBC: 0 % (ref 0.0–0.2)

## 2021-06-29 LAB — COMPREHENSIVE METABOLIC PANEL
ALT: 19 U/L (ref 0–44)
AST: 22 U/L (ref 15–41)
Albumin: 3.7 g/dL (ref 3.5–5.0)
Alkaline Phosphatase: 67 U/L (ref 38–126)
Anion gap: 10 (ref 5–15)
BUN: 11 mg/dL (ref 6–20)
CO2: 27 mmol/L (ref 22–32)
Calcium: 8.7 mg/dL — ABNORMAL LOW (ref 8.9–10.3)
Chloride: 99 mmol/L (ref 98–111)
Creatinine, Ser: 1.12 mg/dL — ABNORMAL HIGH (ref 0.44–1.00)
GFR, Estimated: 57 mL/min — ABNORMAL LOW (ref >60)
Glucose, Bld: 296 mg/dL — ABNORMAL HIGH (ref 70–99)
Potassium: 4 mmol/L (ref 3.5–5.1)
Sodium: 136 mmol/L (ref 135–145)
Total Bilirubin: 1 mg/dL (ref 0.3–1.2)
Total Protein: 6.9 g/dL (ref 6.5–8.1)

## 2021-06-29 LAB — URINALYSIS, ROUTINE W REFLEX MICROSCOPIC
Bilirubin Urine: NEGATIVE
Glucose, UA: >=500 mg/dL — AB
Ketones, ur: NEGATIVE mg/dL
Leukocytes,Ua: NEGATIVE
Nitrite: NEGATIVE
Protein, ur: NEGATIVE mg/dL
Specific Gravity, Urine: 1.014 (ref 1.005–1.030)
pH: 5 (ref 5.0–8.0)

## 2021-06-29 LAB — RESP PANEL BY RT-PCR (FLU A&B, COVID) ARPGX2
Influenza A by PCR: NEGATIVE
Influenza B by PCR: NEGATIVE
SARS Coronavirus 2 by RT PCR: NEGATIVE

## 2021-06-29 LAB — LIPASE, BLOOD: Lipase: 48 U/L (ref 11–51)

## 2021-06-29 LAB — TROPONIN I (HIGH SENSITIVITY)
Troponin I (High Sensitivity): 27 ng/L — ABNORMAL HIGH (ref <18)
Troponin I (High Sensitivity): 22 ng/L — ABNORMAL HIGH (ref <18)

## 2021-06-29 MED ORDER — ALBUTEROL SULFATE HFA 108 (90 BASE) MCG/ACT IN AERS
2.0000 | INHALATION_SPRAY | Freq: Once | RESPIRATORY_TRACT | Status: AC
  Administered 2021-06-29: 2 via RESPIRATORY_TRACT
  Filled 2021-06-29: qty 6.7

## 2021-06-29 MED ORDER — BENZONATATE 100 MG PO CAPS
100.0000 mg | ORAL_CAPSULE | Freq: Three times a day (TID) | ORAL | 0 refills | Status: DC
  Filled 2021-06-29: qty 21, 7d supply, fill #0

## 2021-06-29 MED ORDER — ACETAMINOPHEN 500 MG PO TABS
1000.0000 mg | ORAL_TABLET | Freq: Once | ORAL | Status: AC
Start: 2021-06-29 — End: 2021-06-29
  Administered 2021-06-29: 1000 mg via ORAL
  Filled 2021-06-29: qty 2

## 2021-06-29 MED ORDER — ALBUTEROL SULFATE HFA 108 (90 BASE) MCG/ACT IN AERS
2.0000 | INHALATION_SPRAY | RESPIRATORY_TRACT | 1 refills | Status: DC | PRN
  Filled 2021-06-29: qty 8.5, 25d supply, fill #0

## 2021-06-29 MED ORDER — SODIUM CHLORIDE 0.9 % IV BOLUS
1000.0000 mL | Freq: Once | INTRAVENOUS | Status: AC
  Administered 2021-06-29: 1000 mL via INTRAVENOUS

## 2021-06-29 NOTE — Discharge Instructions (Addendum)
Return for any problem.  ?

## 2021-06-29 NOTE — ED Provider Triage Note (Signed)
Emergency Medicine Provider Triage Evaluation Note ? ?Kelly Santana , a 61 y.o. female  was evaluated in triage.  Pt complains of dyspnea x 2 days. Started w/ sxs of congestion & cough and had multiple episodes of emesis evening of onset. Vomiting has improved, remains with some nausea, but primarily bothered by respiratory sxs, dry cough, wheezing, dyspnea and also has had chest tightness. Reports hx of bronchitis and has not had her inhaler. ? ?Review of Systems  ?Positive: Dyspnea, wheezing, chest tightness, nausea, vomiting ?Negative: Fever, abdominal pain, melena, hematemesis, syncope, leg pain/swelling, hemoptysis ? ?Physical Exam  ?BP (!) 174/90   Pulse 88   Temp 98.4 ?F (36.9 ?C) (Oral)   Resp 18   SpO2 100%  ?Gen:   Awake, no distress   ?Resp:  Normal effort  ?MSK:   Moves extremities without difficulty, no significant LE edema.  ? ?Medical Decision Making  ?Medically screening exam initiated at 6:10 AM.  Appropriate orders placed.  Kelly Santana was informed that the remainder of the evaluation will be completed by another provider, this initial triage assessment does not replace that evaluation, and the importance of remaining in the ED until their evaluation is complete. ? ?Dyspnea ?  ?Cherly Anderson, PA-C ?06/29/21 0037 ? ?

## 2021-06-29 NOTE — ED Triage Notes (Signed)
Pt c/o cough, shortness of breath and vomiting that started on Sunday. States vomiting has resolved, but cough and shortness of breath continue.  ?

## 2021-06-29 NOTE — ED Provider Notes (Signed)
Vomiting ?Index ?Provider Note ? ? ?CSN: [id number] ?Arrival date & time: 06/29/21  5284 ? ?  ? ?History ? ?Chief Complaint  ?Patient presents with  ? Shortness of Breath  ? ? ?Kelly Santana is a 61 y.o. female. ? ?61 year old female with prior medical history as detailed below presents for evaluation.  Patient reports mild cough and vomiting that started approximately 2 days ago. ? ?Mild cough continues.  Vomiting has resolved. ? ?Patient denies fever.  Patient reports sick contacts in her home with similar symptoms. ? ?She reports use of albuterol inhaler did help her symptoms somewhat. ? ?The history is provided by the patient and medical records.  ?Illness ?Location:  Congestion, cough, vomiting ?Severity:  Mild ?Onset quality:  Gradual ?Duration:  4 days ?Timing:  Rare ?Progression:  Improving ?Chronicity:  New ? ?  ? ?Home Medications ?Prior to Admission medications   ?Medication Sig Start Date End Date Taking? Authorizing Provider  ?albuterol (PROAIR HFA) 108 (90 Base) MCG/ACT inhaler INHALE 1 TO 2 PUFFS INTO THE LUNGS EVERY 6 HOURS AS NEEDED FOR WHEEZING OR SHORTNESS OF BREATH. 03/18/21   Charlott Rakes, MD  ?atorvastatin (LIPITOR) 40 MG tablet Take 1 tablet (40 mg total) by mouth daily. 05/07/21   Charlott Rakes, MD  ?benazepril (LOTENSIN) 40 MG tablet TAKE 1 TABLET (40 MG TOTAL) BY MOUTH DAILY. 02/03/21 02/03/22  Charlott Rakes, MD  ?blood glucose meter kit and supplies KIT Dispense based on patient and insurance preference. Use up to four times daily as directed. (FOR ICD-9 250.00, 250.01). 04/18/15   Regalado, Belkys A, MD  ?carvedilol (COREG) 25 MG tablet TAKE 1 TABLET (25 MG TOTAL) BY MOUTH 2 (TWO) TIMES DAILY WITH A MEAL. 02/03/21 02/03/22  Charlott Rakes, MD  ?chlorhexidine (PERIDEX) 0.12 % solution Rinse twice a day for 14 days 01/29/21     ?chlorhexidine (PERIDEX) 0.12 % solution Rinse by mouth twice a day for 14 days. 02/18/21     ?clindamycin (CLEOCIN) 150  MG capsule Take one capsule every 6 hours 01/14/21     ?cyclobenzaprine (FLEXERIL) 10 MG tablet Take 1 tablet (10 mg total) by mouth 2 (two) times daily as needed for muscle spasms. 03/18/21   Charlott Rakes, MD  ?diclofenac Sodium (VOLTAREN) 1 % GEL Apply 4 g topically 4 (four) times daily. 02/03/21   Charlott Rakes, MD  ?fluticasone (FLONASE) 50 MCG/ACT nasal spray Place 2 sprays into both nostrils daily. 08/21/18   Charlott Rakes, MD  ?gabapentin (NEURONTIN) 300 MG capsule Take 2 capsules (600 mg total) by mouth 2 (two) times daily. 05/06/21   Charlott Rakes, MD  ?glimepiride (AMARYL) 4 MG tablet TAKE 1 TABLET (4 MG TOTAL) BY MOUTH 2 (TWO) TIMES DAILY. 02/03/21 02/03/22  Charlott Rakes, MD  ?glucose blood (RELION GLUCOSE TEST STRIPS) test strip Use as instructed 05/24/16   Charlott Rakes, MD  ?hydrochlorothiazide (HYDRODIURIL) 25 MG tablet TAKE 1 TABLET (25 MG TOTAL) BY MOUTH DAILY. 02/03/21 02/03/22  Charlott Rakes, MD  ?insulin glargine-yfgn (SEMGLEE, YFGN,) 100 UNIT/ML Pen Inject 63 Units into the skin 2 (two) times daily. 03/12/21   Charlott Rakes, MD  ?insulin lispro (HUMALOG KWIKPEN) 100 UNIT/ML KwikPen Inject 0-12 Units into the skin 3 (three) times daily. 05/10/21   Charlott Rakes, MD  ?Insulin Pen Needle 31G X 5 MM MISC Use 5 times daily as needed with Insulin Glargine and Humalog. 11/19/20   Charlott Rakes, MD  ?Insulin Syringe-Needle U-100 31G X 5/16" 1 ML MISC 1  each by Does not apply route 4 (four) times daily. 10/04/16   Charlott Rakes, MD  ?ipratropium-albuterol (DUONEB) 0.5-2.5 (3) MG/3ML SOLN TAKE 3 MLS BY NEBULIZATION EVERY 6 (SIX) HOURS AS NEEDED. 03/26/20 03/26/21  Charlott Rakes, MD  ?loratadine (CLARITIN) 10 MG tablet Take 1 tablet (10 mg total) by mouth daily. 08/21/18   Charlott Rakes, MD  ?magnesium oxide (MAG-OX) 400 MG tablet Take 1 tablet (400 mg total) by mouth in the morning and at bedtime. 06/15/21   Charlott Rakes, MD  ?metoCLOPramide (REGLAN) 10 MG tablet TAKE 1 TABLET (10 MG TOTAL) BY  MOUTH EVERY 8 (EIGHT) HOURS AS NEEDED FOR NAUSEA. 06/02/20 06/02/21  Charlott Rakes, MD  ?mometasone-formoterol (DULERA) 100-5 MCG/ACT AERO INHALE 2 PUFFS INTO THE LUNGS 2 (TWO) TIMES DAILY. 06/02/20 06/02/21  Charlott Rakes, MD  ?Multiple Vitamin (MULTIVITAMIN WITH MINERALS) TABS tablet Take 1 tablet by mouth daily.    [provider]  ?omeprazole (PRILOSEC) 20 MG capsule TAKE 1 CAPSULE (20 MG TOTAL) BY MOUTH DAILY. 09/02/20 09/02/21  Charlott Rakes, MD  ?omeprazole (PRILOSEC) 20 MG capsule Take 1 capsule (20 mg total) by mouth daily. 02/03/21   Charlott Rakes, MD  ?potassium chloride SA (KLOR-CON M) 20 MEQ tablet TAKE 1.5 TABLETS (30 MEQ TOTAL) BY MOUTH DAILY. 01/28/21 01/28/22  Charlott Rakes, MD  ?RELION LANCETS MICRO-THIN 33G MISC 1 each by Does not apply route at bedtime. 07/24/15   Charlott Rakes, MD  ?spironolactone (ALDACTONE) 25 MG tablet TAKE 1 TABLET (25 MG TOTAL) BY MOUTH DAILY. 02/03/21 02/03/22  Charlott Rakes, MD  ?Syringe/Needle, Disp, (SYRINGE 3CC/21GX1-1/4") 21G X 1-1/4" 3 ML MISC Inject 1 application as directed daily. 04/18/15   Regalado, Belkys A, MD  ?traZODone (DESYREL) 100 MG tablet TAKE 0.5 TABLETS (50 MG TOTAL) BY MOUTH AT BEDTIME. 06/02/20 06/02/21  Charlott Rakes, MD  ?   ? ?Allergies    ?Invokamet [canagliflozin-metformin hcl], Sulfonamide derivatives, and Ibuprofen   ? ?Review of Systems   ?Review of Systems  ?All other systems reviewed and are negative. ? ?Physical Exam ?Updated Vital Signs ?BP 140/77   Pulse 89   Temp 98.4 ?F (36.9 ?C) (Oral)   Resp (!) 28   SpO2 96%  ?Physical Exam ?Vitals and nursing note reviewed.  ?Constitutional:   ?   General: She is not in acute distress. ?   Appearance: Normal appearance. She is well-developed.  ?HENT:  ?   Head: Normocephalic and atraumatic.  ?Eyes:  ?   Conjunctiva/sclera: Conjunctivae normal.  ?   Pupils: Pupils are equal, round, and reactive to light.  ?Cardiovascular:  ?   Rate and Rhythm: Normal rate and regular rhythm.  ?   Heart sounds:  Normal heart sounds.  ?Pulmonary:  ?   Effort: Pulmonary effort is normal. No respiratory distress.  ?   Breath sounds: Normal breath sounds.  ?Abdominal:  ?   General: There is no distension.  ?   Palpations: Abdomen is soft.  ?   Tenderness: There is no abdominal tenderness.  ?Musculoskeletal:     ?   General: No deformity. Normal range of motion.  ?   Cervical back: Normal range of motion and neck supple.  ?Skin: ?   General: Skin is warm and dry.  ?Neurological:  ?   General: No focal deficit present.  ?   Mental Status: She is alert and oriented to person, place, and time.  ? ? ?ED Results / Procedures / Treatments   ?Labs ?(all labs ordered are listed,  but only abnormal results are displayed) ?Labs Reviewed  ?COMPREHENSIVE METABOLIC PANEL - Abnormal; Notable for the following components:  ?    Result Value  ? Glucose, Bld 296 (*)   ? Creatinine, Ser 1.12 (*)   ? Calcium 8.7 (*)   ? GFR, Estimated 57 (*)   ? All other components within normal limits  ?URINALYSIS, ROUTINE W REFLEX MICROSCOPIC - Abnormal; Notable for the following components:  ? Glucose, UA >=500 (*)   ? Hgb urine dipstick SMALL (*)   ? Bacteria, UA RARE (*)   ? All other components within normal limits  ?TROPONIN I (HIGH SENSITIVITY) - Abnormal; Notable for the following components:  ? Troponin I (High Sensitivity) 27 (*)   ? All other components within normal limits  ?RESP PANEL BY RT-PCR (FLU A&B, COVID) [id number]  ?CBC WITH DIFFERENTIAL/PLATELET  ?LIPASE, BLOOD  ?TROPONIN I (HIGH SENSITIVITY)  ? ? ?EKG ?EKG Interpretation ? ?Date/Time:  Tuesday Jun 29 2021 06:13:00 EDT ?Ventricular Rate:  87 ?PR Interval:  156 ?QRS Duration: 78 ?QT Interval:  386 ?QTC Calculation: 464 ?R Axis:   44 ?Text Interpretation: Normal sinus rhythm Nonspecific ST abnormality Abnormal ECG When compared with ECG of 08-May-2017 01:20, PREVIOUS ECG IS PRESENT Confirmed by Dene Gentry [id number]) on 06/29/2021 10:14:17 AM ? ?Radiology ?DG Chest 2 View ? ?Result Date:  06/29/2021 ?CLINICAL DATA:  Dyspnea, coughing and chest congestion. EXAM: CHEST - 2 VIEW COMPARISON:  PA Lat 02/07/2017. FINDINGS: The heart is upper-normal in size. No vascular congestion is seen. There is mild aort

## 2021-06-30 ENCOUNTER — Other Ambulatory Visit: Payer: Self-pay

## 2021-07-02 DIAGNOSIS — F439 Reaction to severe stress, unspecified: Secondary | ICD-10-CM | POA: Diagnosis not present
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
## 2021-07-07 ENCOUNTER — Ambulatory Visit: Payer: BC Managed Care – PPO | Attending: Family Medicine

## 2021-07-07 DIAGNOSIS — R2689 Other abnormalities of gait and mobility: Secondary | ICD-10-CM | POA: Insufficient documentation

## 2021-07-07 DIAGNOSIS — M6281 Muscle weakness (generalized): Secondary | ICD-10-CM | POA: Diagnosis not present

## 2021-07-07 DIAGNOSIS — M5459 Other low back pain: Secondary | ICD-10-CM | POA: Diagnosis not present
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
## 2021-07-14 ENCOUNTER — Other Ambulatory Visit: Payer: Self-pay | Admitting: Family Medicine

## 2021-07-14 ENCOUNTER — Other Ambulatory Visit: Payer: Self-pay

## 2021-07-14 DIAGNOSIS — F411 Generalized anxiety disorder: Secondary | ICD-10-CM

## 2021-07-14 MED ORDER — HYDROXYZINE HCL 25 MG PO TABS
ORAL_TABLET | Freq: Three times a day (TID) | ORAL | 1 refills | Status: AC | PRN
  Filled 2021-07-14: qty 60, 20d supply, fill #0

## 2021-07-14 MED ORDER — INSULIN GLARGINE-YFGN 100 UNIT/ML ~~LOC~~ SOPN
63.0000 [IU] | PEN_INJECTOR | Freq: Two times a day (BID) | SUBCUTANEOUS | 0 refills | Status: DC
  Filled 2021-07-14: qty 30, 24d supply, fill #0

## 2021-07-15 ENCOUNTER — Other Ambulatory Visit: Payer: Self-pay

## 2021-07-15 ENCOUNTER — Telehealth: Payer: Self-pay | Admitting: Physical Medicine and Rehabilitation

## 2021-07-15 NOTE — Telephone Encounter (Signed)
Pt called and would like to repeat a right hip injection. Last injection was in January of 2023. She states the injection is just now about to wear off so she would like to get another.   CB [phone]

## 2021-07-16 ENCOUNTER — Other Ambulatory Visit: Payer: Self-pay

## 2021-07-16 DIAGNOSIS — M5430 Sciatica, unspecified side: Secondary | ICD-10-CM | POA: Diagnosis not present

## 2021-07-21 ENCOUNTER — Ambulatory Visit: Payer: BC Managed Care – PPO

## 2021-07-21 ENCOUNTER — Other Ambulatory Visit: Payer: Self-pay

## 2021-07-21 DIAGNOSIS — R2689 Other abnormalities of gait and mobility: Secondary | ICD-10-CM

## 2021-07-21 DIAGNOSIS — M5459 Other low back pain: Secondary | ICD-10-CM

## 2021-07-21 DIAGNOSIS — M6281 Muscle weakness (generalized): Secondary | ICD-10-CM
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
## 2021-07-23 ENCOUNTER — Other Ambulatory Visit: Payer: Self-pay

## 2021-07-28 DIAGNOSIS — F411 Generalized anxiety disorder: Secondary | ICD-10-CM | POA: Diagnosis not present

## 2021-07-28 DIAGNOSIS — F4312 Post-traumatic stress disorder, chronic: Secondary | ICD-10-CM | POA: Diagnosis not present

## 2021-07-28 DIAGNOSIS — F1020 Alcohol dependence, uncomplicated: Secondary | ICD-10-CM | POA: Diagnosis not present

## 2021-07-28 DIAGNOSIS — F17200 Nicotine dependence, unspecified, uncomplicated: Secondary | ICD-10-CM | POA: Diagnosis not present

## 2021-07-29 ENCOUNTER — Other Ambulatory Visit: Payer: Self-pay

## 2021-08-03 ENCOUNTER — Other Ambulatory Visit: Payer: Self-pay

## 2021-08-03 ENCOUNTER — Ambulatory Visit: Payer: BC Managed Care – PPO | Admitting: Physical Medicine and Rehabilitation

## 2021-08-03 ENCOUNTER — Ambulatory Visit: Payer: Self-pay

## 2021-08-03 ENCOUNTER — Encounter: Payer: Self-pay | Admitting: Physical Medicine and Rehabilitation

## 2021-08-03 DIAGNOSIS — M25551 Pain in right hip: Secondary | ICD-10-CM

## 2021-08-03 NOTE — Progress Notes (Unsigned)
Pt state right hip pain. Pt state sitting and bending makes the pain worse. Pt state she takes over the counter pain meds to help ease her pain.  Numeric Pain Rating Scale and Functional Assessment Average Pain 2   In the last MONTH (on 0-10 scale) has pain interfered with the following?  1. General activity like being  able to carry out your everyday physical activities such as walking, climbing stairs, carrying groceries, or moving a chair?  Rating(9)  -BT, -Dye Allergies.

## 2021-08-03 NOTE — Progress Notes (Unsigned)
   Kelly Santana - 61 y.o. female MRN [id number]  Date of birth: 11-23-1960  Office Visit Note: Visit Date: 08/03/2021 PCP: Hoy Register, MD Referred by: Hoy Register, MD  Subjective: Chief Complaint  Patient presents with   Right Hip - Pain   HPI:  Kelly Santana is a 61 y.o. female who comes in todayHPI ROS Otherwise per HPI.  Assessment & Plan: Visit Diagnoses: No diagnosis found.  Plan: No additional findings.   Meds & Orders: No orders of the defined types were placed in this encounter.  No orders of the defined types were placed in this encounter.   Follow-up: No follow-ups on file.   Procedures: Large Joint Inj: R hip joint on 08/03/2021 10:06 AM Indications: diagnostic evaluation and pain Details: 22 G 3.5 in needle, fluoroscopy-guided anterior approach  Arthrogram: No  Medications: 4 mL bupivacaine 0.25 %; 60 mg triamcinolone acetonide 40 MG/ML Outcome: tolerated well, no immediate complications  There was excellent flow of contrast producing a partial arthrogram of the hip. The patient did have relief of symptoms during the anesthetic phase of the injection. Procedure, treatment alternatives, risks and benefits explained, specific risks discussed. Consent was given by the patient. Immediately prior to procedure a time out was called to verify the correct patient, procedure, equipment, support staff and site/side marked as required. Patient was prepped and draped in the usual sterile fashion.         Clinical History: No specialty comments available.     Objective:  VS:  HT:    WT:   BMI:     BP:   HR: bpm  TEMP: ( )  RESP:  Physical Exam   Imaging: No results found.

## 2021-08-04 MED ORDER — TRIAMCINOLONE ACETONIDE 40 MG/ML IJ SUSP
60.0000 mg | INTRAMUSCULAR | Status: AC | PRN
  Administered 2021-08-03: 60 mg via INTRA_ARTICULAR

## 2021-08-04 MED ORDER — BUPIVACAINE HCL 0.25 % IJ SOLN
4.0000 mL | INTRAMUSCULAR | Status: AC | PRN
  Administered 2021-08-03: 4 mL via INTRA_ARTICULAR

## 2021-08-09 ENCOUNTER — Encounter: Payer: Self-pay | Admitting: Family Medicine

## 2021-08-09 ENCOUNTER — Ambulatory Visit: Payer: BC Managed Care – PPO | Attending: Family Medicine | Admitting: Family Medicine

## 2021-08-09 ENCOUNTER — Other Ambulatory Visit: Payer: Self-pay

## 2021-08-09 ENCOUNTER — Ambulatory Visit
Admission: RE | Admit: 2021-08-09 | Discharge: 2021-08-09 | Disposition: A | Discharge status: Home / Self Care | Payer: BC Managed Care – PPO | Source: Ambulatory Visit | Attending: Family Medicine | Admitting: Family Medicine

## 2021-08-09 ENCOUNTER — Telehealth: Payer: Self-pay | Admitting: Family Medicine

## 2021-08-09 VITALS — BP 116/78 | HR 81 | Temp 98.8°F | Ht 65.0 in | Wt 181.2 lb

## 2021-08-09 DIAGNOSIS — M5432 Sciatica, left side: Secondary | ICD-10-CM

## 2021-08-09 DIAGNOSIS — Z794 Long term (current) use of insulin: Secondary | ICD-10-CM

## 2021-08-09 DIAGNOSIS — Z975 Presence of (intrauterine) contraceptive device: Secondary | ICD-10-CM

## 2021-08-09 DIAGNOSIS — I10 Essential (primary) hypertension: Secondary | ICD-10-CM | POA: Diagnosis not present

## 2021-08-09 DIAGNOSIS — E1142 Type 2 diabetes mellitus with diabetic polyneuropathy: Secondary | ICD-10-CM | POA: Diagnosis not present

## 2021-08-09 DIAGNOSIS — F411 Generalized anxiety disorder: Secondary | ICD-10-CM

## 2021-08-09 DIAGNOSIS — K257 Chronic gastric ulcer without hemorrhage or perforation: Secondary | ICD-10-CM

## 2021-08-09 DIAGNOSIS — M5136 Other intervertebral disc degeneration, lumbar region: Secondary | ICD-10-CM | POA: Diagnosis not present

## 2021-08-09 DIAGNOSIS — E876 Hypokalemia: Secondary | ICD-10-CM

## 2021-08-09 LAB — POCT GLYCOSYLATED HEMOGLOBIN (HGB A1C): HbA1c, POC (controlled diabetic range): 9.2 % — AB (ref 0.0–7.0)

## 2021-08-09 LAB — GLUCOSE, POCT (MANUAL RESULT ENTRY): POC Glucose: 119 mg/dl — AB (ref 70–99)

## 2021-08-09 MED ORDER — HYDROCHLOROTHIAZIDE 25 MG PO TABS
ORAL_TABLET | Freq: Every day | ORAL | 1 refills | Status: DC
  Filled 2021-08-09: qty 90, fill #0
  Filled 2021-11-01: qty 90, 90d supply, fill #0
  Filled 2022-01-25: qty 90, 90d supply, fill #1

## 2021-08-09 MED ORDER — FREESTYLE LIBRE 2 READER DEVI
0 refills | Status: AC
  Filled 2021-08-09: qty 1, 84d supply, fill #0

## 2021-08-09 MED ORDER — GLIMEPIRIDE 4 MG PO TABS
ORAL_TABLET | ORAL | 1 refills | Status: DC
  Filled 2021-08-09: qty 60, 30d supply, fill #0
  Filled 2021-10-05: qty 60, 30d supply, fill #1
  Filled 2021-11-18: qty 60, 30d supply, fill #2
  Filled 2021-12-27: qty 60, 30d supply, fill #3
  Filled 2022-01-25: qty 60, 30d supply, fill #4
  Filled 2022-03-06 – 2022-03-10 (×2): qty 60, 30d supply, fill #5

## 2021-08-09 MED ORDER — INSULIN GLARGINE-YFGN 100 UNIT/ML ~~LOC~~ SOPN
63.0000 [IU] | PEN_INJECTOR | Freq: Two times a day (BID) | SUBCUTANEOUS | 6 refills | Status: DC
  Filled 2021-08-09: qty 30, 24d supply, fill #0
  Filled 2021-11-01: qty 30, 24d supply, fill #1
  Filled 2021-11-29: qty 30, 24d supply, fill #2
  Filled 2022-01-10: qty 30, 24d supply, fill #3
  Filled 2022-03-06: qty 30, 24d supply, fill #4

## 2021-08-09 MED ORDER — POTASSIUM CHLORIDE CRYS ER 20 MEQ PO TBCR
EXTENDED_RELEASE_TABLET | ORAL | 1 refills | Status: DC
  Filled 2021-08-09: qty 135, fill #0
  Filled 2021-08-22: qty 135, 90d supply, fill #0
  Filled 2021-11-18: qty 45, 30d supply, fill #1
  Filled 2021-11-18: qty 135, 90d supply, fill #1

## 2021-08-09 MED ORDER — ATORVASTATIN CALCIUM 40 MG PO TABS
40.0000 mg | ORAL_TABLET | Freq: Every day | ORAL | 1 refills | Status: DC
  Filled 2021-08-09 – 2021-11-01 (×2): qty 90, 90d supply, fill #0
  Filled 2022-01-31: qty 90, 90d supply, fill #1

## 2021-08-09 MED ORDER — FREESTYLE LIBRE 2 SENSOR MISC
3 refills | Status: AC
  Filled 2021-08-09: qty 2, 28d supply, fill #0

## 2021-08-09 MED ORDER — BENAZEPRIL HCL 40 MG PO TABS
ORAL_TABLET | Freq: Every day | ORAL | 1 refills | Status: DC
  Filled 2021-08-09: qty 90, fill #0
  Filled 2021-09-09: qty 30, 30d supply, fill #0
  Filled 2021-10-07: qty 30, 30d supply, fill #1
  Filled 2021-11-04: qty 30, 30d supply, fill #2
  Filled 2021-12-06: qty 30, 30d supply, fill #3
  Filled 2022-01-07: qty 30, 30d supply, fill #4
  Filled 2022-02-05: qty 30, 30d supply, fill #5

## 2021-08-09 MED ORDER — SPIRONOLACTONE 25 MG PO TABS
ORAL_TABLET | Freq: Every day | ORAL | 1 refills | Status: DC
  Filled 2021-08-09: qty 90, fill #0
  Filled 2021-09-09: qty 30, 30d supply, fill #0
  Filled 2021-10-05: qty 30, 30d supply, fill #1
  Filled 2021-11-04: qty 30, 30d supply, fill #2
  Filled 2021-12-03: qty 30, 30d supply, fill #3
  Filled 2022-01-07: qty 30, 30d supply, fill #4
  Filled 2022-02-01: qty 30, 30d supply, fill #5

## 2021-08-09 NOTE — Telephone Encounter (Signed)
Kelly Santana, can we please send prescription for CGM covered by her insurance? Thanks

## 2021-08-09 NOTE — Progress Notes (Signed)
Back pain Discuss IUD removal.

## 2021-08-09 NOTE — Telephone Encounter (Signed)
Yes ma'am, rx sent.  
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
## 2021-08-09 NOTE — Addendum Note (Signed)
Addended by: Lois Huxley, Jeannett Senior L on: 08/09/2021 10:29 AM   Modules accepted: Orders

## 2021-08-10 ENCOUNTER — Other Ambulatory Visit: Payer: Self-pay

## 2021-08-12 ENCOUNTER — Other Ambulatory Visit: Payer: Self-pay

## 2021-08-23 ENCOUNTER — Other Ambulatory Visit: Payer: Self-pay

## 2021-08-24 ENCOUNTER — Other Ambulatory Visit: Payer: Self-pay

## 2021-09-09 ENCOUNTER — Other Ambulatory Visit: Payer: Self-pay

## 2021-09-10 ENCOUNTER — Other Ambulatory Visit: Payer: Self-pay

## 2021-09-14 ENCOUNTER — Other Ambulatory Visit: Payer: Self-pay

## 2021-09-15 DIAGNOSIS — I10 Essential (primary) hypertension: Secondary | ICD-10-CM | POA: Diagnosis not present

## 2021-09-15 DIAGNOSIS — H35033 Hypertensive retinopathy, bilateral: Secondary | ICD-10-CM | POA: Diagnosis not present

## 2021-09-15 DIAGNOSIS — H2513 Age-related nuclear cataract, bilateral: Secondary | ICD-10-CM | POA: Diagnosis not present

## 2021-09-15 DIAGNOSIS — E1136 Type 2 diabetes mellitus with diabetic cataract: Secondary | ICD-10-CM | POA: Diagnosis not present

## 2021-09-20 ENCOUNTER — Other Ambulatory Visit: Payer: Self-pay

## 2021-09-22 ENCOUNTER — Telehealth: Payer: Self-pay | Admitting: Family Medicine

## 2021-09-22 NOTE — Telephone Encounter (Signed)
Copied from CRM [id number]. Topic: Appointment Scheduling - Scheduling Inquiry for Clinic >> Sep 22, 2021 11:19 AM Tiffany B wrote: Reason for CRM:  patient would like to know if PCP can remove her IUD on 11/10/2021 visit instead of her 11/24/2021. Please advise/

## 2021-09-23 NOTE — Telephone Encounter (Signed)
Patient was called and appointment has been changed to accommodate patient.

## 2021-09-28 ENCOUNTER — Other Ambulatory Visit: Payer: Self-pay

## 2021-09-28 DIAGNOSIS — H524 Presbyopia: Secondary | ICD-10-CM | POA: Diagnosis not present

## 2021-09-28 DIAGNOSIS — H259 Unspecified age-related cataract: Secondary | ICD-10-CM | POA: Diagnosis not present

## 2021-10-05 ENCOUNTER — Other Ambulatory Visit: Payer: Self-pay

## 2021-10-07 ENCOUNTER — Other Ambulatory Visit: Payer: Self-pay

## 2021-10-11 ENCOUNTER — Other Ambulatory Visit: Payer: Self-pay

## 2021-11-02 ENCOUNTER — Other Ambulatory Visit: Payer: Self-pay

## 2021-11-03 ENCOUNTER — Other Ambulatory Visit: Payer: Self-pay

## 2021-11-04 ENCOUNTER — Other Ambulatory Visit: Payer: Self-pay

## 2021-11-04 DIAGNOSIS — E119 Type 2 diabetes mellitus without complications: Secondary | ICD-10-CM | POA: Diagnosis not present

## 2021-11-04 DIAGNOSIS — I10 Essential (primary) hypertension: Secondary | ICD-10-CM | POA: Diagnosis not present

## 2021-11-04 DIAGNOSIS — E785 Hyperlipidemia, unspecified: Secondary | ICD-10-CM | POA: Diagnosis not present

## 2021-11-04 DIAGNOSIS — M545 Low back pain, unspecified: Secondary | ICD-10-CM | POA: Diagnosis not present

## 2021-11-05 ENCOUNTER — Other Ambulatory Visit: Payer: Self-pay

## 2021-11-10 ENCOUNTER — Ambulatory Visit: Payer: BC Managed Care – PPO | Attending: Family Medicine | Admitting: Family Medicine

## 2021-11-10 ENCOUNTER — Other Ambulatory Visit (HOSPITAL_COMMUNITY)
Admission: RE | Admit: 2021-11-10 | Discharge: 2021-11-10 | Disposition: A | Discharge status: Home / Self Care | Payer: BC Managed Care – PPO | Source: Ambulatory Visit | Attending: Family Medicine | Admitting: Family Medicine

## 2021-11-10 ENCOUNTER — Other Ambulatory Visit (HOSPITAL_BASED_OUTPATIENT_CLINIC_OR_DEPARTMENT_OTHER): Payer: Self-pay

## 2021-11-10 ENCOUNTER — Encounter: Payer: Self-pay | Admitting: Family Medicine

## 2021-11-10 VITALS — BP 142/82 | HR 85 | Temp 98.5°F | Ht 65.0 in | Wt 182.6 lb

## 2021-11-10 DIAGNOSIS — Z124 Encounter for screening for malignant neoplasm of cervix: Secondary | ICD-10-CM

## 2021-11-10 DIAGNOSIS — Z30432 Encounter for removal of intrauterine contraceptive device: Secondary | ICD-10-CM | POA: Diagnosis not present

## 2021-11-10 DIAGNOSIS — Z1231 Encounter for screening mammogram for malignant neoplasm of breast: Secondary | ICD-10-CM

## 2021-11-10 NOTE — Patient Instructions (Signed)
[phone] 

## 2021-11-10 NOTE — Progress Notes (Signed)
IUD removal
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
## 2021-11-13 ENCOUNTER — Encounter: Payer: Self-pay | Admitting: Family Medicine

## 2021-11-15 LAB — CYTOLOGY - PAP
Adequacy: ABSENT
Comment: NEGATIVE
Diagnosis: NEGATIVE
High risk HPV: NEGATIVE

## 2021-11-18 ENCOUNTER — Other Ambulatory Visit: Payer: Self-pay

## 2021-11-24 ENCOUNTER — Ambulatory Visit: Payer: BC Managed Care – PPO | Admitting: Family Medicine

## 2021-11-29 ENCOUNTER — Encounter: Payer: Self-pay | Admitting: Family Medicine

## 2021-11-29 ENCOUNTER — Ambulatory Visit: Payer: BC Managed Care – PPO | Attending: Family Medicine | Admitting: Family Medicine

## 2021-11-29 ENCOUNTER — Other Ambulatory Visit: Payer: Self-pay

## 2021-11-29 ENCOUNTER — Other Ambulatory Visit: Payer: Self-pay | Admitting: Family Medicine

## 2021-11-29 VITALS — BP 140/78 | HR 77 | Ht 65.0 in | Wt 183.4 lb

## 2021-11-29 DIAGNOSIS — Z23 Encounter for immunization: Secondary | ICD-10-CM

## 2021-11-29 DIAGNOSIS — E1142 Type 2 diabetes mellitus with diabetic polyneuropathy: Secondary | ICD-10-CM | POA: Diagnosis not present

## 2021-11-29 DIAGNOSIS — E876 Hypokalemia: Secondary | ICD-10-CM

## 2021-11-29 DIAGNOSIS — E785 Hyperlipidemia, unspecified: Secondary | ICD-10-CM | POA: Diagnosis not present

## 2021-11-29 DIAGNOSIS — I10 Essential (primary) hypertension: Secondary | ICD-10-CM

## 2021-11-29 DIAGNOSIS — I152 Hypertension secondary to endocrine disorders: Secondary | ICD-10-CM

## 2021-11-29 DIAGNOSIS — Z794 Long term (current) use of insulin: Secondary | ICD-10-CM | POA: Diagnosis not present

## 2021-11-29 DIAGNOSIS — E1169 Type 2 diabetes mellitus with other specified complication: Secondary | ICD-10-CM

## 2021-11-29 DIAGNOSIS — K257 Chronic gastric ulcer without hemorrhage or perforation: Secondary | ICD-10-CM

## 2021-11-29 DIAGNOSIS — E1159 Type 2 diabetes mellitus with other circulatory complications: Secondary | ICD-10-CM | POA: Diagnosis not present

## 2021-11-29 DIAGNOSIS — M545 Low back pain, unspecified: Secondary | ICD-10-CM

## 2021-11-29 LAB — GLUCOSE, POCT (MANUAL RESULT ENTRY): POC Glucose: 150 mg/dl — AB (ref 70–99)

## 2021-11-29 LAB — POCT GLYCOSYLATED HEMOGLOBIN (HGB A1C): HbA1c, POC (controlled diabetic range): 7.7 % — AB (ref 0.0–7.0)

## 2021-11-29 MED ORDER — CARVEDILOL 25 MG PO TABS
25.0000 mg | ORAL_TABLET | Freq: Two times a day (BID) | ORAL | 1 refills | Status: DC
  Filled 2021-11-29: qty 180, 90d supply, fill #0
  Filled 2022-03-06: qty 180, 90d supply, fill #1

## 2021-11-29 MED ORDER — POTASSIUM CHLORIDE CRYS ER 20 MEQ PO TBCR
30.0000 meq | EXTENDED_RELEASE_TABLET | Freq: Every day | ORAL | 1 refills | Status: DC
  Filled 2021-11-29: qty 135, fill #0
  Filled 2021-12-14: qty 135, 90d supply, fill #0
  Filled 2022-03-10 – 2022-03-20 (×2): qty 135, 90d supply, fill #1

## 2021-11-29 NOTE — Patient Instructions (Signed)
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
## 2021-11-29 NOTE — Progress Notes (Signed)
Discuss back pain Had some vaginal bleeding after removing nexplanon.

## 2021-11-30 LAB — CMP14+EGFR
ALT: 21 IU/L (ref 0–32)
AST: 21 IU/L (ref 0–40)
Albumin/Globulin Ratio: 2.1 (ref 1.2–2.2)
Albumin: 4.6 g/dL (ref 3.8–4.9)
Alkaline Phosphatase: 79 IU/L (ref 44–121)
BUN/Creatinine Ratio: 14 (ref 9–23)
BUN: 15 mg/dL (ref 6–24)
Bilirubin Total: 0.7 mg/dL (ref 0.0–1.2)
CO2: 27 mmol/L (ref 20–29)
Calcium: 8.4 mg/dL — ABNORMAL LOW (ref 8.7–10.2)
Chloride: 96 mmol/L (ref 96–106)
Creatinine, Ser: 1.09 mg/dL — ABNORMAL HIGH (ref 0.57–1.00)
Globulin, Total: 2.2 g/dL (ref 1.5–4.5)
Glucose: 124 mg/dL — ABNORMAL HIGH (ref 70–99)
Potassium: 3.8 mmol/L (ref 3.5–5.2)
Sodium: 140 mmol/L (ref 134–144)
Total Protein: 6.8 g/dL (ref 6.0–8.5)
eGFR: 59 mL/min/{1.73_m2} — ABNORMAL LOW (ref >59)

## 2021-11-30 LAB — LP+NON-HDL CHOLESTEROL
Cholesterol, Total: 139 mg/dL (ref 100–199)
HDL: 44 mg/dL (ref >39)
LDL Chol Calc (NIH): 64 mg/dL (ref 0–99)
Total Non-HDL-Chol (LDL+VLDL): 95 mg/dL (ref 0–129)
Triglycerides: 183 mg/dL — ABNORMAL HIGH (ref 0–149)
VLDL Cholesterol Cal: 31 mg/dL (ref 5–40)

## 2021-12-01 ENCOUNTER — Telehealth: Payer: Self-pay | Admitting: Emergency Medicine

## 2021-12-01 NOTE — Telephone Encounter (Signed)
Copied from [place] [id number]. Topic: General - Inquiry >> Dec 01, 2021  9:05 AM Chapman Fitch wrote: Reason for CRM: pt called to speak with nurse about the MRI that is suppose to be scheduled for her back / please advise asap

## 2021-12-02 NOTE — Telephone Encounter (Signed)
Pt was called and informed of MRI appointment. 

## 2021-12-03 ENCOUNTER — Other Ambulatory Visit: Payer: Self-pay

## 2021-12-06 ENCOUNTER — Other Ambulatory Visit: Payer: Self-pay

## 2021-12-14 ENCOUNTER — Other Ambulatory Visit: Payer: Self-pay

## 2021-12-15 ENCOUNTER — Ambulatory Visit: Payer: BC Managed Care – PPO | Admitting: Orthopedic Surgery

## 2021-12-17 ENCOUNTER — Other Ambulatory Visit: Payer: BC Managed Care – PPO

## 2021-12-27 ENCOUNTER — Other Ambulatory Visit: Payer: Self-pay

## 2021-12-27 ENCOUNTER — Other Ambulatory Visit: Payer: BC Managed Care – PPO

## 2021-12-29 ENCOUNTER — Other Ambulatory Visit: Payer: Self-pay

## 2021-12-29 ENCOUNTER — Encounter: Payer: Self-pay | Admitting: Orthopedic Surgery

## 2021-12-29 ENCOUNTER — Ambulatory Visit (INDEPENDENT_AMBULATORY_CARE_PROVIDER_SITE_OTHER): Payer: BC Managed Care – PPO

## 2021-12-29 ENCOUNTER — Ambulatory Visit (INDEPENDENT_AMBULATORY_CARE_PROVIDER_SITE_OTHER): Payer: BC Managed Care – PPO | Admitting: Orthopedic Surgery

## 2021-12-29 VITALS — BP 138/86 | HR 86 | Ht 65.0 in | Wt 183.5 lb

## 2021-12-29 DIAGNOSIS — M545 Low back pain, unspecified: Secondary | ICD-10-CM | POA: Diagnosis not present

## 2021-12-29 NOTE — Progress Notes (Signed)
Orthopedic Spine Surgery Office Note  Assessment: Patient is a 61 y.o. female with low back pain that radiates into her left posterior thigh.  Has some SI joint positive provocative maneuvers but that would not explain the pain radiating down her posterior thigh. Has right hip pain is getting injections with Dr. Ernestina Patches for OA - this is not the pain that she is here for   Plan: -Explained that initially conservative treatment is tried as a significant number of patients may experience relief with these treatment modalities. Discussed that the conservative treatments include:  -activity modification  -physical therapy  -over the counter pain medications  -medrol dosepak  -lumbar steroid injections -Patient has tried physical therapy, activity modification, cane use, Tylenol, gabapentin, NSAIDs -Patient has diabetes and explained that Medrol Dosepak would be more for acute flare of pain so we decided that that was not worth trialing at this point -Recommended MRI of the lumbar spine to evaluate for radiculopathy.  Patient did not want to do an MRI because it would cost her over $900.  I explained that if she changes her mind, she can call the office and I will order an MRI. -Since she is looking for medical management of her chronic pain and has tried all the modalities that I normally prescribe (tylenol NSAIDs, gabapentin, robaxin, PT), provided her with a referral to pain management -Patient should return to office on an as-needed basis   Patient expressed understanding of the plan and all questions were answered to the patient's satisfaction.   ___________________________________________________________________________   History:  Patient is a 61 y.o. female who presents today for lumbar spine.  Patient has had over 1 year of low back pain that radiates into her left posterior thigh.  She has no right-sided symptoms.  There is no trauma or injury that brought on the pain.  The pain mostly  is in her mid lumbar spine and goes into the left buttock.  However, there frequently pains that go down the posterior aspect of her left thigh.  She states has been getting progressively worse with time.  She states it is harder when she is standing for prolonged period of time or at work.  If she does rest, it does get better.  Has been using a cane as needed to help with the pain.   Weakness: Denies Symptoms of imbalance: Denies Paresthesias and numbness: Denies Bowel or bladder incontinence: Denies Saddle anesthesia: Denies  Treatments tried: Physical therapy, activity modification, gabapentin, Tylenol, NSAIDs, home exercise, cane use  Review of systems: Denies fevers and chills, night sweats, unexplained weight loss, history of cancer.  Has had pain that wakes her at night  Past medical history: Hypertension GERD Anxiety Neuropathy Diabetes (last A1C 7.7 - 11/29/2021) Chronic pain  Allergies: Sulfa, ibuprofen  Past surgical history:  Left breast surgery C-section   Social history: Reports use of nicotine product (smoking, vaping, patches, smokeless) -1 pack/week Alcohol use: Yes, 3 drinks/week Denies recreational drug use   Physical Exam:  General: no acute distress, appears stated age Neurologic: alert, answering questions appropriately, following commands Respiratory: unlabored breathing on room air, symmetric chest rise Psychiatric: appropriate affect, normal cadence to speech   MSK (spine):  -Strength exam      Left  Right EHL    5/5  5/5 TA    5/5  5/5 GSC    5/5  5/5 Knee extension  5/5  5/5 Hip flexion   5/5  5/5  -Sensory exam  Sensation intact to light touch in L3-S1 nerve distributions of bilateral lower extremities  -Achilles DTR: 1/4 on the left, 1/4 on the right -Patellar tendon DTR: 1/4 on the left, 1/4 on the right  -Straight leg raise: Negative -Contralateral straight leg raise: Negative -Clonus: no beats bilaterally  -Left hip  exam: Positive FADIR, no pain through remainder of range of motion at hip, positive Pearlean Brownie, negative Gaenslen's, negative SI distraction test, positive SI compression test, mild tenderness to palpation over the SI joint otherwise nontender to palpation over the remainder of the hip -Right hip exam: pain at extremes of motion, positive stinchfield  Imaging: XR of the lumbar spine from 08/09/2021 and 12/29/2021 was independently reviewed and interpreted, showing no fracture or dislocation. No evidence of instability on flexion/extension. No significant degenerative changes.     Patient name: Kelly Santana Patient MRN: [id number] Date of visit: 12/29/21

## 2022-01-04 ENCOUNTER — Telehealth: Payer: Self-pay | Admitting: Orthopedic Surgery

## 2022-01-04 NOTE — Telephone Encounter (Signed)
I left voicemail for patient advising referral has been received and is in review process. Explained that she is awaiting approval and someone will call to schedule her appointment.

## 2022-01-04 NOTE — Telephone Encounter (Signed)
Patient states Dr Laurance Flatten was suppose to send a referral to pain management and she has not heard anything. Best contact number [phone]

## 2022-01-07 ENCOUNTER — Other Ambulatory Visit: Payer: Self-pay

## 2022-01-10 ENCOUNTER — Other Ambulatory Visit: Payer: Self-pay

## 2022-01-10 ENCOUNTER — Encounter: Payer: Self-pay | Admitting: Physical Medicine & Rehabilitation

## 2022-01-10 ENCOUNTER — Other Ambulatory Visit: Payer: Self-pay | Admitting: Family Medicine

## 2022-01-10 MED ORDER — TRUEPLUS 5-BEVEL PEN NEEDLES 31G X 5 MM MISC
6 refills | Status: DC
  Filled 2022-01-10: qty 100, 20d supply, fill #0
  Filled 2022-05-25: qty 100, 20d supply, fill #1
  Filled 2022-07-11: qty 100, 20d supply, fill #2
  Filled 2022-11-01: qty 100, 20d supply, fill #3
  Filled 2023-01-09: qty 100, 20d supply, fill #4

## 2022-01-11 ENCOUNTER — Ambulatory Visit: Payer: Self-pay | Admitting: *Deleted

## 2022-01-11 DIAGNOSIS — N950 Postmenopausal bleeding: Secondary | ICD-10-CM

## 2022-01-11 NOTE — Telephone Encounter (Signed)
Routing to PCP for review.

## 2022-01-11 NOTE — Telephone Encounter (Signed)
I have placed a GYN referral and also ordered an ultrasound.  Please schedule ultrasound for patient.

## 2022-01-11 NOTE — Telephone Encounter (Signed)
Summary: vaginal bleeding   Pt called about her menstrual cycle / a while back she has a cycle that lasted  months due to hemorrhaging and she got an IUD put in that was taking out last month / she started a new cycle Nov 1st and is still bleeding / she stated it looks like brown discharge since Nov 9th / please advise       Chief Complaint: vaginal bleeding since 12/29/21 until now  Symptoms: bleeding decreasing since 12/29/21 now brown discharge more than spotting not soaking pads but has had period greater than 7 days. IUD removed and now bleeding lasting greater than 7 days. Some abdominal cramping at beginning of period but none now  Frequency: approx 2 weeks  Pertinent Negatives: Patient denies lightheadedness, no sx abdominal pain now.  Disposition: [] ED /[] Urgent Care (no appt availability in office) / [] Appointment(In office/virtual)/ []  [facility]/ [x] Home Care/ [] Refused Recommended Disposition /[] [facility]/ []  Follow-up with PCP Additional Notes:   Please advise if patient needs appt. Patient wanted to update PCP due to recent hx with removal of IUD. Please advise.       Reason for Disposition  [1] Menstrual cycle < 21 days OR > 35 days AND [2] has occurred once this past year  Answer Assessment - Initial Assessment Questions 1. AMOUNT: "Describe the bleeding that you are having."    - SPOTTING: spotting, or pinkish / brownish mucous discharge; does not fill panty liner or pad    - MILD:  less than 1 pad / hour; less than patient's usual menstrual bleeding   - MODERATE: 1-2 pads / hour; 1 menstrual cup every 6 hours; small-medium blood clots (e.g., pea, grape, small coin)   - SEVERE: soaking 2 or more pads/hour for 2 or more hours; 1 menstrual cup every 2 hours; bleeding not contained by pads or continuous red blood from vagina; large blood clots (e.g., golf ball, large coin)      Spotting brown discharge  2. ONSET: "When did the bleeding begin?" "Is  it continuing now?"     November 1st  3. MENSTRUAL PERIOD: "When was the last normal menstrual period?" "How is this different than your period?"     Has had IUD and removed 4. REGULARITY: "How regular are your periods?"     na 5. ABDOMEN PAIN: "Do you have any pain?" "How bad is the pain?"  (e.g., Scale 1-10; mild, moderate, or severe)   - MILD (1-3): doesn't interfere with normal activities, abdomen soft and not tender to touch    - MODERATE (4-7): interferes with normal activities or awakens from sleep, abdomen tender to touch    - SEVERE (8-10): excruciating pain, doubled over, unable to do any normal activities      No , light cramping in beginning of period  6. PREGNANCY: "Is there any chance you are pregnant?" "When was your last menstrual period?"     na 7. BREASTFEEDING: "Are you breastfeeding?"     na 8. HORMONE MEDICINES: "Are you taking any hormone medicines, prescription or over-the-counter?" (e.g., birth control pills, estrogen)     na 9. BLOOD THINNER MEDICINES: "Do you take any blood thinners?" (e.g., Coumadin / warfarin, Pradaxa / dabigatran, aspirin)     na 10. CAUSE: "What do you think is causing the bleeding?" (e.g., recent gyn surgery, recent gyn procedure; known bleeding disorder, cervical cancer, polycystic ovarian disease, fibroids)         Not sure  11. HEMODYNAMIC STATUS: "Are you weak or feeling lightheaded?" If Yes, ask: "Can you stand and walk normally?"        No  12. OTHER SYMPTOMS: "What other symptoms are you having with the bleeding?" (e.g., passed tissue, vaginal discharge, fever, menstrual-type cramps)       Abdominal cramping at beginning of period but not now  Protocols used: Vaginal Bleeding - Abnormal-A-AH

## 2022-01-11 NOTE — Telephone Encounter (Signed)
Pt was called and informed of referral and [place] appointment.

## 2022-01-17 ENCOUNTER — Ambulatory Visit (HOSPITAL_COMMUNITY): Payer: BC Managed Care – PPO

## 2022-01-24 ENCOUNTER — Ambulatory Visit (HOSPITAL_COMMUNITY): Payer: BC Managed Care – PPO

## 2022-01-25 ENCOUNTER — Ambulatory Visit: Payer: BC Managed Care – PPO | Admitting: Nurse Practitioner

## 2022-01-25 ENCOUNTER — Other Ambulatory Visit (HOSPITAL_COMMUNITY)
Admission: RE | Admit: 2022-01-25 | Discharge: 2022-01-25 | Disposition: A | Discharge status: Home / Self Care | Payer: BC Managed Care – PPO | Source: Ambulatory Visit | Attending: Nurse Practitioner | Admitting: Nurse Practitioner

## 2022-01-25 ENCOUNTER — Other Ambulatory Visit: Payer: Self-pay | Admitting: Family Medicine

## 2022-01-25 ENCOUNTER — Other Ambulatory Visit: Payer: Self-pay

## 2022-01-25 ENCOUNTER — Encounter: Payer: Self-pay | Admitting: Nurse Practitioner

## 2022-01-25 VITALS — BP 108/80 | HR 84

## 2022-01-25 DIAGNOSIS — N950 Postmenopausal bleeding: Secondary | ICD-10-CM | POA: Insufficient documentation

## 2022-01-25 DIAGNOSIS — N858 Other specified noninflammatory disorders of uterus: Secondary | ICD-10-CM | POA: Diagnosis not present

## 2022-01-25 MED ORDER — MAGNESIUM OXIDE 400 MG PO TABS
400.0000 mg | ORAL_TABLET | Freq: Two times a day (BID) | ORAL | 1 refills | Status: DC
  Filled 2022-01-25: qty 60, 30d supply, fill #0
  Filled 2022-03-20: qty 60, 30d supply, fill #1

## 2022-01-25 NOTE — Addendum Note (Signed)
Addended byWyline Beady on: 01/25/2022 04:14 PM   Modules accepted: Orders

## 2022-01-25 NOTE — Addendum Note (Signed)
Addended by: Jodelle Red D on: 01/25/2022 04:11 PM   Modules accepted: Orders

## 2022-01-25 NOTE — Progress Notes (Addendum)
   Acute Office Visit  Subjective:    Patient ID: Kelly Santana, female    DOB: Mar 28, 1960, 61 y.o.   MRN: [id number]   HPI 61 y.o. presents as new patient for postmenopausal bleeding. IUD removed by PCP in September. Bleeding started right after removal and lasted about 8 days. Bleeding then started back 12/29/21 and has been bleeding since. Not heavy enough to soak through pad/tampon per day but mainly gets a gush of blood after lying down. She also has pelvic cramping and nausea. Normal pap history, most recent 11/10/21. Has always had heavy menses. Negative EMB in 2018 prior to IUD placement. PCP scheduled her for pelvic U/S but patient canceled due to costs and declines rescheduling. H/O HTN, DM.   Review of Systems  Constitutional: Negative.   Genitourinary:  Positive for vaginal bleeding.       Objective:    Physical Exam Constitutional:      Appearance: Normal appearance.  Genitourinary:    General: Normal vulva.     Vagina: Bleeding present.     Cervix: Normal.     Uterus: Normal.      BP 108/80   Pulse 84   SpO2 96%  Wt Readings from Last 3 Encounters:  12/29/21 183 lb 8 oz (83.2 kg)  11/29/21 183 lb 6.4 oz (83.2 kg)  11/10/21 182 lb 9.6 oz (82.8 kg)        Patient informed chaperone available to be present for breast and/or pelvic exam. Patient has requested no chaperone to be present. Patient has been advised what will be completed during breast and pelvic exam.   Assessment & Plan:   Problem List Items Addressed This Visit   None Visit Diagnoses     Postmenopausal bleeding    -  Primary   Relevant Orders   Endometrial biopsy       The indications for endometrial biopsy were reviewed.    Risks of the biopsy including cramping, bleeding, infection, uterine perforation, inadequate specimen and need for additional procedures  were discussed.  The patient states she understands and agrees to undergo procedure today. Consent obtained.  Time out was  performed.   Speculum inserted into the vagina, cervix visualized and was prepped with Betadine. A single-toothed tenaculum was placed on the anterior lip of the cervix to stabilize it.  The 3 mm pipelle was introduced into the endometrial cavity without difficulty to a depth of 6cm, suction initiated and a moderate amount of tissue was obtained and sent to pathology.  The instruments were removed from the patient's vagina.  Minimal bleeding from the cervix was noted.  The patient tolerated the procedure well.   Arlie Solomons, [facility]  Plan: Discussed nature of postmenopausal bleeding and importance of evaluation with pelvic ultrasound and EMB. She declines ultrasound but will proceed with EMB today. Written consent obtained and procedure performed by Arlie Solomons, NP. Please see procedural note.      Olivia Mackie DNP, 3:39 PM 01/25/2022

## 2022-01-27 ENCOUNTER — Other Ambulatory Visit: Payer: Self-pay

## 2022-01-27 LAB — SURGICAL PATHOLOGY

## 2022-01-31 ENCOUNTER — Other Ambulatory Visit: Payer: Self-pay

## 2022-01-31 ENCOUNTER — Other Ambulatory Visit: Payer: Self-pay | Admitting: Nurse Practitioner

## 2022-01-31 DIAGNOSIS — N939 Abnormal uterine and vaginal bleeding, unspecified: Secondary | ICD-10-CM

## 2022-02-01 ENCOUNTER — Other Ambulatory Visit: Payer: Self-pay

## 2022-02-01 ENCOUNTER — Encounter: Payer: Self-pay | Admitting: Nurse Practitioner

## 2022-02-01 ENCOUNTER — Other Ambulatory Visit: Payer: Self-pay | Admitting: Radiology

## 2022-02-01 DIAGNOSIS — M25551 Pain in right hip: Secondary | ICD-10-CM

## 2022-02-01 DIAGNOSIS — M545 Low back pain, unspecified: Secondary | ICD-10-CM

## 2022-02-01 NOTE — Telephone Encounter (Signed)
Order has been placed.

## 2022-02-01 NOTE — Telephone Encounter (Signed)
Pt called stating she is wanting to go ahead with the MRI lumbar spine. Says she is already scheduled at [facility] on 12/13 at 530 but needs the order placed and faxed. Please place order for MRI. thanks

## 2022-02-02 ENCOUNTER — Encounter: Payer: Self-pay | Admitting: Orthopedic Surgery

## 2022-02-07 ENCOUNTER — Other Ambulatory Visit: Payer: Self-pay

## 2022-02-07 NOTE — Telephone Encounter (Signed)
Disregard this message, charted on wrong pt.

## 2022-02-07 NOTE — Telephone Encounter (Signed)
Pt was previously aproved for this mri by another provider, but never had it done, sent message to secure chat advising

## 2022-02-08 ENCOUNTER — Encounter
Payer: BC Managed Care – PPO | Attending: Physical Medicine & Rehabilitation | Admitting: Physical Medicine & Rehabilitation

## 2022-02-08 ENCOUNTER — Other Ambulatory Visit: Payer: Self-pay

## 2022-02-08 ENCOUNTER — Encounter: Payer: Self-pay | Admitting: Physical Medicine & Rehabilitation

## 2022-02-08 VITALS — BP 155/87 | HR 79 | Ht 65.0 in | Wt 186.4 lb

## 2022-02-08 DIAGNOSIS — M62838 Other muscle spasm: Secondary | ICD-10-CM

## 2022-02-08 DIAGNOSIS — E1142 Type 2 diabetes mellitus with diabetic polyneuropathy: Secondary | ICD-10-CM

## 2022-02-08 DIAGNOSIS — M533 Sacrococcygeal disorders, not elsewhere classified: Secondary | ICD-10-CM | POA: Diagnosis not present

## 2022-02-08 MED ORDER — DULOXETINE HCL 30 MG PO CPEP
30.0000 mg | ORAL_CAPSULE | Freq: Every day | ORAL | 2 refills | Status: AC
  Filled 2022-02-08: qty 30, 30d supply, fill #0
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
## 2022-02-10 ENCOUNTER — Other Ambulatory Visit: Payer: Self-pay

## 2022-02-10 NOTE — Telephone Encounter (Signed)
Letter not sent. Letter cancelled.   Call placed to patient. Questions answered. Patient has sent [facility] a request for PUS, if covered, will be scheduled through community care and results to be sent to Elmarie Shiley, NP. Patient will return call to office to notify once decision reached from [place]. If not covered will review alternative options. Patient appreciative of call.   Routing to [place], [employer].

## 2022-02-10 NOTE — Telephone Encounter (Signed)
Order ID: [id number]       Authorized  Approval Valid Through: 02/10/2022 - 03/11/2022

## 2022-02-10 NOTE — Telephone Encounter (Signed)
Letter pended. Copy to Tiffany, NP to review and sign.

## 2022-02-14 ENCOUNTER — Other Ambulatory Visit: Payer: Self-pay

## 2022-02-14 NOTE — Telephone Encounter (Signed)
Patient left message requesting OV notes and labs dated 01/25/22 be faxed to [facility] for review for PUS. Number provided [phone].  01/25/22 OV notes and surgical pathology faxed via EPIC.   MyChart message to patient.

## 2022-02-16 ENCOUNTER — Other Ambulatory Visit: Payer: Self-pay

## 2022-02-18 ENCOUNTER — Telehealth: Payer: Self-pay | Admitting: Orthopedic Surgery

## 2022-02-18 NOTE — Telephone Encounter (Signed)
I called patient she states that she had her MRI on 02/09/22 and I scheduled her a follow up on 02/25/22 @ 815 to review it, I called [facility] and lmom for them to mail [place] CD of her MRI.

## 2022-02-18 NOTE — Telephone Encounter (Signed)
Patient has questions about her MRI.Marland [phone]

## 2022-02-25 ENCOUNTER — Ambulatory Visit: Payer: BC Managed Care – PPO | Admitting: Orthopedic Surgery

## 2022-03-06 ENCOUNTER — Other Ambulatory Visit: Payer: Self-pay | Admitting: Family Medicine

## 2022-03-06 DIAGNOSIS — I10 Essential (primary) hypertension: Secondary | ICD-10-CM

## 2022-03-07 ENCOUNTER — Other Ambulatory Visit: Payer: Self-pay

## 2022-03-07 MED ORDER — SPIRONOLACTONE 25 MG PO TABS
25.0000 mg | ORAL_TABLET | Freq: Every day | ORAL | 0 refills | Status: DC
  Filled 2022-03-07: qty 90, 90d supply, fill #0

## 2022-03-07 MED ORDER — BENAZEPRIL HCL 40 MG PO TABS
40.0000 mg | ORAL_TABLET | Freq: Every day | ORAL | 0 refills | Status: DC
  Filled 2022-03-07: qty 90, 90d supply, fill #0

## 2022-03-07 NOTE — Telephone Encounter (Signed)
Requested Prescriptions  Pending Prescriptions Disp Refills   benazepril (LOTENSIN) 40 MG tablet 90 tablet 0    Sig: TAKE 1 TABLET (40 MG TOTAL) BY MOUTH ONCE DAILY.     Cardiovascular:  ACE Inhibitors Failed - 03/06/2022 10:09 AM      Failed - Cr in normal range and within 180 days    Creat  Date Value Ref Range Status  04/26/2016 1.00 0.50 - 1.05 mg/dL Final    Comment:      For patients > or = 61 years of age: The upper reference limit for Creatinine is approximately 13% higher for people identified as African-American.      Creatinine, Ser  Date Value Ref Range Status  11/29/2021 1.09 (H) 0.57 - 1.00 mg/dL Final   Creatinine, Urine  Date Value Ref Range Status  12/23/2015 198 20 - 320 mg/dL Final         Failed - Last BP in normal range    BP Readings from Last 1 Encounters:  02/08/22 (!) 155/87         Passed - K in normal range and within 180 days    Potassium  Date Value Ref Range Status  11/29/2021 3.8 3.5 - 5.2 mmol/L Final         Passed - Patient is not pregnant      Passed - Valid encounter within last 6 months    Recent Outpatient Visits           3 months ago Type 2 diabetes mellitus with diabetic polyneuropathy, with long-term current use of insulin (HCC)   [facility] [place], Odette Horns, MD   3 months ago Encounter for IUD removal   [facility] [facility] [facility] [place], [place], MD   7 months ago Type 2 diabetes mellitus with diabetic polyneuropathy, with long-term current use of insulin (HCC)   [place] [facility] [place], Odette Horns, MD   8 months ago Hand cramps   [place] [facility] [place], [place], MD   10 months ago Type 2 diabetes mellitus with diabetic polyneuropathy, with long-term current use of insulin (HCC)    [facility] Hoy Register, MD       Future Appointments             In 3 days Hoy Register, MD [facility]   In 1 week London Sheer, MD [facility] [place]             spironolactone (ALDACTONE) 25 MG tablet 90 tablet 0    Sig: TAKE 1 TABLET (25 MG TOTAL) BY MOUTH ONCE DAILY.     Cardiovascular: Diuretics - Aldosterone Antagonist Failed - 03/06/2022 10:09 AM      Failed - Cr in normal range and within 180 days    Creat  Date Value Ref Range Status  04/26/2016 1.00 0.50 - 1.05 mg/dL Final    Comment:      For patients > or = 61 years of age: The upper reference limit for Creatinine is approximately 13% higher for people identified as African-American.      Creatinine, Ser  Date Value Ref Range Status  11/29/2021 1.09 (H) 0.57 - 1.00 mg/dL Final   Creatinine, Urine  Date Value Ref Range Status  12/23/2015 198 20 - 320 mg/dL Final         Failed - Last BP in normal range  BP Readings from Last 1 Encounters:  02/08/22 (!) 155/87         Passed - K in normal range and within 180 days    Potassium  Date Value Ref Range Status  11/29/2021 3.8 3.5 - 5.2 mmol/L Final         Passed - Na in normal range and within 180 days    Sodium  Date Value Ref Range Status  11/29/2021 140 134 - 144 mmol/L Final         Passed - eGFR is 30 or above and within 180 days    GFR, Est African American  Date Value Ref Range Status  04/26/2016 74 >=60 mL/min Final   GFR calc Af Amer  Date Value Ref Range Status  03/04/2020 60 >59 mL/min/1.73 Final    Comment:    **In accordance with recommendations from the NKF-ASN Task force,**   [facility] is in the process of updating its eGFR calculation to the   2021 CKD-EPI creatinine equation that estimates kidney function   without a race variable.    GFR, Est Non African American  Date Value Ref Range Status  04/26/2016 64 >=60 mL/min Final   GFR, Estimated  Date Value Ref Range Status  06/29/2021 57 (L) >60 mL/min Final    Comment:    (NOTE) Calculated using the CKD-EPI Creatinine Equation  (2021)    eGFR  Date Value Ref Range Status  11/29/2021 59 (L) >59 mL/min/1.73 Final         Passed - Valid encounter within last 6 months    Recent Outpatient Visits           3 months ago Type 2 diabetes mellitus with diabetic polyneuropathy, with long-term current use of insulin ([place])   [place], Charlane Ferretti, MD   3 months ago Encounter for IUD removal   [place], [place], MD   7 months ago Type 2 diabetes mellitus with diabetic polyneuropathy, with long-term current use of insulin ([place])   [place], Charlane Ferretti, MD   8 months ago Hand cramps   [place], [place], MD   10 months ago Type 2 diabetes mellitus with diabetic polyneuropathy, with long-term current use of insulin ([place])   [place], [place], MD       Future Appointments             In 3 days Charlott Rakes, MD [place]   In 1 week Callie Fielding, MD [facility]

## 2022-03-08 ENCOUNTER — Ambulatory Visit: Payer: BC Managed Care – PPO | Admitting: Physical Medicine & Rehabilitation

## 2022-03-10 ENCOUNTER — Ambulatory Visit: Payer: BC Managed Care – PPO | Attending: Family Medicine | Admitting: Family Medicine

## 2022-03-10 ENCOUNTER — Other Ambulatory Visit: Payer: Self-pay

## 2022-03-10 ENCOUNTER — Encounter: Payer: Self-pay | Admitting: Family Medicine

## 2022-03-10 VITALS — BP 143/81 | HR 80 | Temp 98.3°F | Ht 65.0 in | Wt 185.4 lb

## 2022-03-10 DIAGNOSIS — I152 Hypertension secondary to endocrine disorders: Secondary | ICD-10-CM

## 2022-03-10 DIAGNOSIS — E1169 Type 2 diabetes mellitus with other specified complication: Secondary | ICD-10-CM

## 2022-03-10 DIAGNOSIS — M533 Sacrococcygeal disorders, not elsewhere classified: Secondary | ICD-10-CM | POA: Diagnosis not present

## 2022-03-10 DIAGNOSIS — K3184 Gastroparesis: Secondary | ICD-10-CM

## 2022-03-10 DIAGNOSIS — E785 Hyperlipidemia, unspecified: Secondary | ICD-10-CM

## 2022-03-10 DIAGNOSIS — E1159 Type 2 diabetes mellitus with other circulatory complications: Secondary | ICD-10-CM | POA: Diagnosis not present

## 2022-03-10 DIAGNOSIS — E1143 Type 2 diabetes mellitus with diabetic autonomic (poly)neuropathy: Secondary | ICD-10-CM | POA: Diagnosis not present

## 2022-03-10 DIAGNOSIS — Z794 Long term (current) use of insulin: Secondary | ICD-10-CM

## 2022-03-10 DIAGNOSIS — J4520 Mild intermittent asthma, uncomplicated: Secondary | ICD-10-CM

## 2022-03-10 DIAGNOSIS — F411 Generalized anxiety disorder: Secondary | ICD-10-CM

## 2022-03-10 DIAGNOSIS — E1142 Type 2 diabetes mellitus with diabetic polyneuropathy: Secondary | ICD-10-CM | POA: Diagnosis not present

## 2022-03-10 LAB — POCT GLYCOSYLATED HEMOGLOBIN (HGB A1C): HbA1c, POC (controlled diabetic range): 9.1 % — AB (ref 0.0–7.0)

## 2022-03-10 LAB — GLUCOSE, POCT (MANUAL RESULT ENTRY): POC Glucose: 145 mg/dl — AB (ref 70–99)

## 2022-03-10 MED ORDER — GLIMEPIRIDE 4 MG PO TABS
4.0000 mg | ORAL_TABLET | Freq: Two times a day (BID) | ORAL | 1 refills | Status: DC
  Filled 2022-04-17: qty 180, 90d supply, fill #0
  Filled 2022-07-14: qty 60, 30d supply, fill #1
  Filled 2022-09-01: qty 60, 30d supply, fill #2

## 2022-03-10 MED ORDER — ATORVASTATIN CALCIUM 40 MG PO TABS
40.0000 mg | ORAL_TABLET | Freq: Every day | ORAL | 1 refills | Status: DC
  Filled 2022-03-10 – 2022-05-04 (×2): qty 90, 90d supply, fill #0
  Filled 2022-07-26: qty 90, 90d supply, fill #1

## 2022-03-10 MED ORDER — BENAZEPRIL HCL 40 MG PO TABS
40.0000 mg | ORAL_TABLET | Freq: Every day | ORAL | 1 refills | Status: DC
  Filled 2022-06-04: qty 90, 90d supply, fill #0
  Filled 2022-09-01: qty 90, 90d supply, fill #1

## 2022-03-10 MED ORDER — SPIRONOLACTONE 25 MG PO TABS
25.0000 mg | ORAL_TABLET | Freq: Every day | ORAL | 1 refills | Status: DC
  Filled 2022-06-04: qty 90, 90d supply, fill #0
  Filled 2022-09-01: qty 90, 90d supply, fill #1

## 2022-03-10 MED ORDER — HYDROCHLOROTHIAZIDE 25 MG PO TABS
25.0000 mg | ORAL_TABLET | Freq: Every day | ORAL | 1 refills | Status: DC
  Filled 2022-03-10: qty 90, fill #0
  Filled 2022-04-18: qty 90, 90d supply, fill #0
  Filled 2022-07-26: qty 90, 90d supply, fill #1

## 2022-03-10 MED ORDER — CARVEDILOL 25 MG PO TABS
25.0000 mg | ORAL_TABLET | Freq: Two times a day (BID) | ORAL | 1 refills | Status: DC
  Filled 2022-07-11: qty 180, 90d supply, fill #0
  Filled 2023-01-09: qty 180, 90d supply, fill #1
  Filled 2023-01-11: qty 60, 30d supply, fill #1

## 2022-03-10 MED ORDER — INSULIN GLARGINE-YFGN 100 UNIT/ML ~~LOC~~ SOPN
64.0000 [IU] | PEN_INJECTOR | Freq: Two times a day (BID) | SUBCUTANEOUS | 6 refills | Status: DC
  Filled 2022-03-10 (×2): qty 30, 23d supply, fill #0
  Filled 2022-04-18: qty 30, 23d supply, fill #1
  Filled 2022-05-25: qty 30, 23d supply, fill #2

## 2022-03-10 MED ORDER — GABAPENTIN 300 MG PO CAPS
600.0000 mg | ORAL_CAPSULE | Freq: Two times a day (BID) | ORAL | 1 refills | Status: DC
  Filled 2022-03-10 – 2022-04-17 (×2): qty 360, 90d supply, fill #0
  Filled 2022-10-11: qty 360, 90d supply, fill #1

## 2022-03-10 NOTE — Patient Instructions (Signed)
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
## 2022-03-11 LAB — CMP14+EGFR
ALT: 24 IU/L (ref 0–32)
AST: 27 IU/L (ref 0–40)
Albumin/Globulin Ratio: 1.9 (ref 1.2–2.2)
Albumin: 4.4 g/dL (ref 3.8–4.9)
Alkaline Phosphatase: 86 IU/L (ref 44–121)
BUN/Creatinine Ratio: 17 (ref 12–28)
BUN: 18 mg/dL (ref 8–27)
Bilirubin Total: 0.6 mg/dL (ref 0.0–1.2)
CO2: 26 mmol/L (ref 20–29)
Calcium: 8.9 mg/dL (ref 8.7–10.3)
Chloride: 98 mmol/L (ref 96–106)
Creatinine, Ser: 1.06 mg/dL — ABNORMAL HIGH (ref 0.57–1.00)
Globulin, Total: 2.3 g/dL (ref 1.5–4.5)
Glucose: 152 mg/dL — ABNORMAL HIGH (ref 70–99)
Potassium: 4 mmol/L (ref 3.5–5.2)
Sodium: 139 mmol/L (ref 134–144)
Total Protein: 6.7 g/dL (ref 6.0–8.5)
eGFR: 60 mL/min/{1.73_m2} (ref >59)

## 2022-03-11 LAB — LP+NON-HDL CHOLESTEROL
Cholesterol, Total: 145 mg/dL (ref 100–199)
HDL: 40 mg/dL (ref >39)
LDL Chol Calc (NIH): 66 mg/dL (ref 0–99)
Total Non-HDL-Chol (LDL+VLDL): 105 mg/dL (ref 0–129)
Triglycerides: 237 mg/dL — ABNORMAL HIGH (ref 0–149)
VLDL Cholesterol Cal: 39 mg/dL (ref 5–40)

## 2022-03-14 ENCOUNTER — Other Ambulatory Visit: Payer: Self-pay

## 2022-03-16 ENCOUNTER — Other Ambulatory Visit (HOSPITAL_COMMUNITY): Payer: Self-pay

## 2022-03-16 ENCOUNTER — Other Ambulatory Visit: Payer: Self-pay

## 2022-03-17 ENCOUNTER — Ambulatory Visit (INDEPENDENT_AMBULATORY_CARE_PROVIDER_SITE_OTHER): Payer: BC Managed Care – PPO | Admitting: Orthopedic Surgery

## 2022-03-17 DIAGNOSIS — M5416 Radiculopathy, lumbar region: Secondary | ICD-10-CM

## 2022-03-17 NOTE — Progress Notes (Signed)
Orthopedic Spine Surgery Office Note  Assessment: Patient is a 62 y.o. female with low back pain that radiates into her left posterior thigh. Has L3/4 and L4/5 disc bulges that are larger on the left side. Also, has mild left foraminal stenosis at L4/5. Still has some SI provocative maneuvers that point to SI joint as possible component of her pain   Plan: -Explained that initially conservative treatment is tried as a significant number of patients may experience relief with these treatment modalities. Discussed that the conservative treatments include:  -activity modification  -physical therapy  -over the counter pain medications  -medrol dosepak  -steroid injections -Patient has tried physical therapy, activity modification, cane use, Tylenol, gabapentin, NSAIDs  -Recommended left L4/5 transforaminal injection for diagnostic and therapeutic purposes -Would need to get her A1C less than 7.5 if we ever consider surgery as a treatment option -Patient should return to office in 6 weeks, x-rays at next visit: none   Patient expressed understanding of the plan and all questions were answered to the patient's satisfaction.   ___________________________________________________________________________  History: Patient is a 62 y.o. female who has been previously seen in the office for symptoms of low back pain that radiates into her left buttock and posterior thigh. Pain has remained about the same since the last time I saw her. It waxes and wanes in terms of intensity on a week to week basis. She is still not having any right sided symptoms. Pain is not felt to be related to activity at this point. She can have it even when sitting. Denies paresthesias and numbness.   Previous treatments: physical therapy, activity modification, cane use, Tylenol, gabapentin, NSAIDs    Physical Exam:  General: no acute distress, appears stated age Neurologic: alert, answering questions appropriately,  following commands Respiratory: unlabored breathing on room air, symmetric chest rise Psychiatric: appropriate affect, normal cadence to speech   MSK (spine):  -Strength exam      Left  Right EHL    5/5  5/5 TA    5/5  5/5 GSC    5/5  5/5 Knee extension  5/5  5/5 Hip flexion   5/5  5/5  -Sensory exam    Sensation intact to light touch in L3-S1 nerve distributions of bilateral lower extremities  -Achilles DTR: 1/4 on the left, 1/4 on the right -Patellar tendon DTR: 1/4 on the left, 1/4 on the right  -Straight leg raise: negative -Contralateral straight leg raise: negative -Clonus: no beats bilaterally  -Left hip exam: no pain through range of motion, positive FABER, negative Gaenslen's, negative SI distraction test, positive SI compression test, mild tenderness to palpation over the SI joint otherwise nontender to palpation over the remainder of the hip -Right hip exam: pain at extremes of motion, positive stinchfield  Imaging: XR of the lumbar spine from 08/09/2021 and 12/29/2021 was previously independently reviewed and interpreted, showing no fracture or dislocation. No evidence of instability on flexion/extension. No significant degenerative changes.    MRI (on disc) of the lumbar spine from 02/09/2022 was independently reviewed and interpreted, showing disc bulges at L3/4 and L4/5 that are slightly larger on the left side. Mild foraminal stenosis at L3/4 and L4/5.    Patient name: Kelly Santana Patient MRN: [id number] Date of visit: 03/17/22

## 2022-03-21 ENCOUNTER — Other Ambulatory Visit: Payer: Self-pay

## 2022-03-22 ENCOUNTER — Other Ambulatory Visit: Payer: Self-pay

## 2022-03-22 ENCOUNTER — Encounter: Payer: BC Managed Care – PPO | Admitting: Physical Medicine & Rehabilitation

## 2022-03-24 ENCOUNTER — Encounter: Payer: BC Managed Care – PPO | Admitting: Physical Medicine & Rehabilitation

## 2022-03-25 ENCOUNTER — Telehealth: Payer: Self-pay

## 2022-03-25 NOTE — Telephone Encounter (Signed)
-----  Message from Tamela Gammon, NP sent at 03/24/2022 12:09 PM EST ----- Regarding: Follow up Just received ultrasound report from outside facility for this patient for evaluation of PMB. Ultrasound performed 03/22/22. Slightly thickened endo lining, EMB done here 01/25/22 prior to imaging and was benign. Looks like there may be a polyp. Needs [facility] to confirm. Please call to schedule this or see if patient has plans to follow up elsewhere. I believe she was having them ordered through [place] due to costs. If we get the images (images not available in EPIC/care everywhere) and physician is confident it is a polyp we may be able to bypass [facility] and have polyp removed. That will be MD's discretion.

## 2022-03-25 NOTE — Telephone Encounter (Signed)
Pt notified and voiced understanding. States she will reach out to the location regarding if [facility] would be covered by insurance and will also wait for our response as well. I advised her that I would confirm with you that we just received the report from the [place] from the external location and not the images, is that correct? If that is the case, I advised her that we may end up asking if she could have them send [place] the images as well. And we will have one of the Mds review and provide their recommendations as far as if [facility] is needed and/or moving forward with removal of polyp? Please advise/confirm. Thanks.

## 2022-03-28 ENCOUNTER — Ambulatory Visit: Payer: BC Managed Care – PPO | Admitting: Physical Medicine and Rehabilitation

## 2022-03-28 ENCOUNTER — Ambulatory Visit: Payer: Self-pay

## 2022-03-28 VITALS — BP 150/87 | HR 83

## 2022-03-28 DIAGNOSIS — M5416 Radiculopathy, lumbar region: Secondary | ICD-10-CM | POA: Diagnosis not present

## 2022-03-28 MED ORDER — METHYLPREDNISOLONE ACETATE 80 MG/ML IJ SUSP
80.0000 mg | Freq: Once | INTRAMUSCULAR | Status: AC
  Administered 2022-03-28: 80 mg

## 2022-03-28 NOTE — Procedures (Signed)
Lumbosacral Transforaminal Epidural Steroid Injection - Sub-Pedicular Approach with Fluoroscopic Guidance  Patient: Kelly Santana      Date of Birth: 1960/11/10 MRN: [id number] PCP: Charlott Rakes, MD      Visit Date: 03/28/2022   Universal Protocol:    Date/Time: 03/28/2022  Consent Given By: the patient  Position: PRONE  Additional Comments: Vital signs were monitored before and after the procedure. Patient was prepped and draped in the usual sterile fashion. The correct patient, procedure, and site was verified.   Injection Procedure Details:   Procedure diagnoses: Lumbar radiculopathy [M54.16]    Meds Administered:  Meds ordered this encounter  Medications   methylPREDNISolone acetate (DEPO-MEDROL) injection 80 mg    Laterality: Left  Location/Site: L4  Needle:5.0 in., 22 ga.  Short bevel or Quincke spinal needle  Needle Placement: Transforaminal  Findings:    -Comments: Excellent flow of contrast along the nerve, nerve root and into the epidural space.  Procedure Details: After squaring off the end-plates to get a true AP view, the C-arm was positioned so that an oblique view of the foramen as noted above was visualized. The target area is just inferior to the "nose of the scotty dog" or sub pedicular. The soft tissues overlying this structure were infiltrated with 2-3 ml. of 1% Lidocaine without Epinephrine.  The spinal needle was inserted toward the target using a "trajectory" view along the fluoroscope beam.  Under AP and lateral visualization, the needle was advanced so it did not puncture dura and was located close the 6 O'Clock position of the pedical in AP tracterory. Biplanar projections were used to confirm position. Aspiration was confirmed to be negative for CSF and/or blood. A 1-2 ml. volume of Isovue-250 was injected and flow of contrast was noted at each level. Radiographs were obtained for documentation purposes.   After attaining the desired flow  of contrast documented above, a 0.5 to 1.0 ml test dose of 0.25% Marcaine was injected into each respective transforaminal space.  The patient was observed for 90 seconds post injection.  After no sensory deficits were reported, and normal lower extremity motor function was noted,   the above injectate was administered so that equal amounts of the injectate were placed at each foramen (level) into the transforaminal epidural space.   Additional Comments:  The patient tolerated the procedure well Dressing: 2 x 2 sterile gauze and Band-Aid    Post-procedure details: Patient was observed during the procedure. Post-procedure instructions were reviewed.  Patient left the clinic in stable condition.

## 2022-03-28 NOTE — Patient Instructions (Signed)

## 2022-03-28 NOTE — Progress Notes (Signed)
Kelly Santana - 62 y.o. female MRN [id number]  Date of birth: 20-Feb-1961  Office Visit Note: Visit Date: 03/28/2022 PCP: Charlott Rakes, MD Referred by: Callie Fielding, MD  Subjective: Chief Complaint  Patient presents with   Lower Back - Pain   HPI:  Kelly Santana is a 62 y.o. female who comes in today at the request of Dr. Ileene Rubens for planned Left L4-5 Lumbar Transforaminal epidural steroid injection with fluoroscopic guidance.  The patient has failed conservative care including home exercise, medications, time and activity modification.  This injection will be diagnostic and hopefully therapeutic.  Please see requesting physician notes for further details and justification.   ROS Otherwise per HPI.  Assessment & Plan: Visit Diagnoses:    ICD-10-CM   1. Lumbar radiculopathy  M54.16 XR C-ARM NO REPORT    Epidural Steroid injection    methylPREDNISolone acetate (DEPO-MEDROL) injection 80 mg      Plan: No additional findings.   Meds & Orders:  Meds ordered this encounter  Medications   methylPREDNISolone acetate (DEPO-MEDROL) injection 80 mg    Orders Placed This Encounter  Procedures   XR C-ARM NO REPORT   Epidural Steroid injection    Follow-up: Return for visit to requesting provider as needed.   Procedures: No procedures performed  Lumbosacral Transforaminal Epidural Steroid Injection - Sub-Pedicular Approach with Fluoroscopic Guidance  Patient: Kelly Santana      Date of Birth: Oct 27, 1960 MRN: [id number] PCP: Charlott Rakes, MD      Visit Date: 03/28/2022   Universal Protocol:    Date/Time: 03/28/2022  Consent Given By: the patient  Position: PRONE  Additional Comments: Vital signs were monitored before and after the procedure. Patient was prepped and draped in the usual sterile fashion. The correct patient, procedure, and site was verified.   Injection Procedure Details:   Procedure diagnoses: Lumbar radiculopathy [M54.16]     Meds Administered:  Meds ordered this encounter  Medications   methylPREDNISolone acetate (DEPO-MEDROL) injection 80 mg    Laterality: Left  Location/Site: L4  Needle:5.0 in., 22 ga.  Short bevel or Quincke spinal needle  Needle Placement: Transforaminal  Findings:    -Comments: Excellent flow of contrast along the nerve, nerve root and into the epidural space.  Procedure Details: After squaring off the end-plates to get a true AP view, the C-arm was positioned so that an oblique view of the foramen as noted above was visualized. The target area is just inferior to the "nose of the scotty dog" or sub pedicular. The soft tissues overlying this structure were infiltrated with 2-3 ml. of 1% Lidocaine without Epinephrine.  The spinal needle was inserted toward the target using a "trajectory" view along the fluoroscope beam.  Under AP and lateral visualization, the needle was advanced so it did not puncture dura and was located close the 6 O'Clock position of the pedical in AP tracterory. Biplanar projections were used to confirm position. Aspiration was confirmed to be negative for CSF and/or blood. A 1-2 ml. volume of Isovue-250 was injected and flow of contrast was noted at each level. Radiographs were obtained for documentation purposes.   After attaining the desired flow of contrast documented above, a 0.5 to 1.0 ml test dose of 0.25% Marcaine was injected into each respective transforaminal space.  The patient was observed for 90 seconds post injection.  After no sensory deficits were reported, and normal lower extremity motor function was noted,   the above injectate was administered  so that equal amounts of the injectate were placed at each foramen (level) into the transforaminal epidural space.   Additional Comments:  The patient tolerated the procedure well Dressing: 2 x 2 sterile gauze and Band-Aid    Post-procedure details: Patient was observed during the  procedure. Post-procedure instructions were reviewed.  Patient left the clinic in stable condition.    Clinical History: No specialty comments available.     Objective:  VS:  HT:    WT:   BMI:     BP:136/84  HR:83bpm  TEMP: ( )  RESP:  Physical Exam Vitals and nursing note reviewed.  Constitutional:      General: She is not in acute distress.    Appearance: Normal appearance. She is not ill-appearing.  HENT:     Head: Normocephalic and atraumatic.     Right Ear: External ear normal.     Left Ear: External ear normal.  Eyes:     Extraocular Movements: Extraocular movements intact.  Cardiovascular:     Rate and Rhythm: Normal rate.     Pulses: Normal pulses.  Pulmonary:     Effort: Pulmonary effort is normal. No respiratory distress.  Abdominal:     General: There is no distension.     Palpations: Abdomen is soft.  Musculoskeletal:        General: Tenderness present.     Cervical back: Neck supple.     Right lower leg: No edema.     Left lower leg: No edema.     Comments: Patient has good distal strength with no pain over the greater trochanters.  No clonus or focal weakness.  Skin:    Findings: No erythema, lesion or rash.  Neurological:     General: No focal deficit present.     Mental Status: She is alert and oriented to person, place, and time.     Sensory: No sensory deficit.     Motor: No weakness or abnormal muscle tone.     Coordination: Coordination normal.  Psychiatric:        Mood and Affect: Mood normal.        Behavior: Behavior normal.      Imaging: No results found.

## 2022-03-28 NOTE — Progress Notes (Signed)
Functional Pain Scale - descriptive words and definitions  Unmanageable (7)  Pain interferes with normal ADL's/nothing seems to help/sleep is very difficult/active distractions are very difficult to concentrate on. Severe range order  Average Pain 9   +Driver- no driver, -BT, -Dye Allergies.  Left sided lower back pain that radiates down the left leg to the calf

## 2022-03-28 NOTE — Telephone Encounter (Signed)
Pt notified and voiced understanding. Will reach out to [facility] about having images sent over as well.

## 2022-03-28 NOTE — Telephone Encounter (Signed)
Yes, we can access the written interpretation but we do not have access to the images. They would want to see the images prior to making the decision if [facility] should be performed or if they are confident that it is indeed a polyp and schedule removal without SHGM.

## 2022-03-29 NOTE — Telephone Encounter (Signed)
FYI. Pt LVM in triage line stating that [facility] was going to send images over via "power share" through "[employer]." Pt states [facility] told her that we would know what that meant. Please advise if there is anything I can inquire/assist further.

## 2022-03-30 NOTE — Telephone Encounter (Signed)
I spoke with patient and informed her TW and Dr. Marguerita Merles are unfamiliar with Power Share through [employer].  I did explain that our providers receive very few images and are not familiar with it. I told her I thought the imaging centers used it but our office does not have access.  She said she will call [facility] and see if she can go by on Monday when she is off and pick up the images and bring to our office.

## 2022-03-30 NOTE — Telephone Encounter (Signed)
Patient called to follow up to see if TW has received the images.  She would like to know if she can schedule follow up appointment.

## 2022-04-03 ENCOUNTER — Other Ambulatory Visit: Payer: Self-pay | Admitting: Family Medicine

## 2022-04-03 DIAGNOSIS — M62838 Other muscle spasm: Secondary | ICD-10-CM

## 2022-04-05 ENCOUNTER — Other Ambulatory Visit: Payer: Self-pay

## 2022-04-05 MED ORDER — CYCLOBENZAPRINE HCL 10 MG PO TABS
10.0000 mg | ORAL_TABLET | Freq: Two times a day (BID) | ORAL | 2 refills | Status: AC | PRN
  Filled 2022-04-05: qty 60, 30d supply, fill #0
  Filled 2022-09-12: qty 60, 30d supply, fill #1

## 2022-04-05 NOTE — Telephone Encounter (Signed)
Requested medication (s) are due for refill today: yes  Requested medication (s) are on the active medication list: yes  Last refill:  03/22/21  Future visit scheduled: yes  Notes to clinic:  Unable to refill per protocol, cannot delegate.       Requested Prescriptions  Pending Prescriptions Disp Refills   cyclobenzaprine (FLEXERIL) 10 MG tablet 60 tablet 2    Sig: Take 1 tablet (10 mg total) by mouth 2 (two) times daily as needed for muscle spasms.     Not Delegated - Analgesics:  Muscle Relaxants Failed - 04/03/2022  9:03 PM      Failed - This refill cannot be delegated      Passed - Valid encounter within last 6 months    Recent Outpatient Visits           3 weeks ago Type 2 diabetes mellitus with diabetic polyneuropathy, with long-term current use of insulin ([place])   [place], [place], MD   4 months ago Type 2 diabetes mellitus with diabetic polyneuropathy, with long-term current use of insulin ([place])   [place] Charlott Rakes, MD   4 months ago Encounter for IUD removal   [place], Charlane Ferretti, MD   7 months ago Type 2 diabetes mellitus with diabetic polyneuropathy, with long-term current use of insulin [facility])   [place], [place], MD   9 months ago Hand cramps   [place], MD       Future Appointments             In 1 month Laurance Flatten, Venia Carbon, MD [place]   In 2 months Charlott Rakes, MD [place]

## 2022-04-05 NOTE — Telephone Encounter (Signed)
Routing to Tiffany FYI.   Encounter closed.  

## 2022-04-07 ENCOUNTER — Encounter: Payer: Self-pay | Admitting: Physical Medicine and Rehabilitation

## 2022-04-07 ENCOUNTER — Other Ambulatory Visit: Payer: Self-pay

## 2022-04-18 ENCOUNTER — Other Ambulatory Visit: Payer: Self-pay

## 2022-04-20 ENCOUNTER — Telehealth: Payer: Self-pay | Admitting: *Deleted

## 2022-04-20 NOTE — Telephone Encounter (Signed)
-----   Message from Princess Bruins, MD sent at 04/18/2022  3:16 PM EST ----- Regarding: Schedule Preop with me Pelvic US at [facility] reviewed.  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  Please schedule preop visit for probable HSC/D+C/Myosure.

## 2022-04-20 NOTE — Telephone Encounter (Signed)
Spoke with patient. Advised per Dr. Dellis Filbert.  Pre-op/ Consult scheduled for 05/04/22 at 11am. Patient declined earlier date offered.   Advised patient once visit complete, if she decides to proceed with surgery I will return call to further discuss surgery dates and planning. Patient agreeable.   Routing to Dr. Dellis Filbert for surgery request once visit completed.

## 2022-04-21 NOTE — Telephone Encounter (Signed)
Pls refer to telephone encounter from 04/20/2022. Will close this encounter.

## 2022-04-27 ENCOUNTER — Other Ambulatory Visit: Payer: Self-pay

## 2022-04-27 NOTE — Progress Notes (Signed)
Patient outreached by Angus Seller, PharmD Candidate on 04/27/22 to discuss hypertension.   Patient does not have an automated home blood pressure machine. She knows her BP's been high in the past around 150s/80s.   Medication review was performed. They are taking medications as prescribed: benazepril, Coreg, HCTZ, spirolactone.   The following barriers to adherence were noted:  - They do not have cost concerns.  - They do not have transportation concerns.  - They do not need assistance obtaining refills.  - They do not occasionally forget to take some of their prescribed medications. States that she is very adherent to her medications.  - They do not feel like one/some of their medications make them feel poorly.  - They do not have questions or concerns about their medications.  - They do have follow up scheduled with their primary care provider/cardiologist.   The following interventions were completed:  - Medications were reviewed  - Patient was educated on goal blood pressures and long term health implications of elevated blood pressure. She feels that she's not able to reach a goal of <130/80 even with all her medications, so we talked about some ways outside of her meds that can help lower BP.  - Patient was educated on medications, including indication and administration  - Patient was counseled on lifestyle modifications to improve blood pressure, including diet, exercise, weight loss. She endorses low salt diet. She states that she needs to exercise more and would like to lose some weight. Discussed starting with daily walks on days when she's not as busy with work.   The patient has follow up scheduled: 06/09/2022  PCP: Dr. Margarita Rana   Angus Seller, PharmD Candidate  [employer]   Joseph Art, [place].D. PGY-2 Ambulatory Care Pharmacy Resident

## 2022-05-04 ENCOUNTER — Encounter: Payer: BC Managed Care – PPO | Admitting: Obstetrics & Gynecology

## 2022-05-04 ENCOUNTER — Other Ambulatory Visit: Payer: Self-pay

## 2022-05-05 DIAGNOSIS — N950 Postmenopausal bleeding: Secondary | ICD-10-CM | POA: Diagnosis not present

## 2022-05-05 DIAGNOSIS — I10 Essential (primary) hypertension: Secondary | ICD-10-CM | POA: Diagnosis not present

## 2022-05-05 DIAGNOSIS — Z0000 Encounter for general adult medical examination without abnormal findings: Secondary | ICD-10-CM | POA: Diagnosis not present

## 2022-05-05 DIAGNOSIS — E119 Type 2 diabetes mellitus without complications: Secondary | ICD-10-CM | POA: Diagnosis not present

## 2022-05-05 DIAGNOSIS — E789 Disorder of lipoprotein metabolism, unspecified: Secondary | ICD-10-CM | POA: Diagnosis not present

## 2022-05-06 ENCOUNTER — Other Ambulatory Visit: Payer: Self-pay

## 2022-05-12 ENCOUNTER — Ambulatory Visit (INDEPENDENT_AMBULATORY_CARE_PROVIDER_SITE_OTHER): Payer: BC Managed Care – PPO | Admitting: Orthopedic Surgery

## 2022-05-12 DIAGNOSIS — M5416 Radiculopathy, lumbar region: Secondary | ICD-10-CM | POA: Diagnosis not present

## 2022-05-12 NOTE — Progress Notes (Signed)
Orthopedic Spine Surgery Office Note  Assessment: Patient is a 62 y.o. female with low back pain that radiates into her left posterior thigh. Has L3/4 and L4/5 disc bulges that are larger on the left side. Also, has mild left foraminal stenosis at L4/5. Still has some SI provocative maneuvers that point to SI joint as possible component of her pain    Plan: -Explained that initially conservative treatment is tried as a significant number of patients may experience relief with these treatment modalities. Discussed that the conservative treatments include:  -activity modification  -physical therapy  -over the counter pain medications  -medrol dosepak  -lumbar steroid injections -Patient has tried physical therapy, activity modification, cane use, Tylenol, gabapentin, NSAIDs, L4/5 transforaminal injection -Recommended continued core strengthening, over-the-counter medications, injections as needed -Would need to get her A1c less than 7.5 we ever consider surgery as a treatment option -Patient will call the office if her leg pain returns and I will provide her with another referral to Dr. Ernestina Patches for injection   Patient expressed understanding of the plan and all questions were answered to the patient's satisfaction.   ___________________________________________________________________________  History: Patient is a 62 y.o. female who has been previously seen in the office for symptoms consistent with lumbar radiculopathy.  Patient has had low back pain that radiates into the left posterior thigh.  She received an injection at L4-5 since the last time I saw her.  She had about 50% relief with that injection.  She feels that her leg pain is tolerable now.  She still has low back pain particular on the left side.  She says that using topical IcyHot works well and heat to the area.  The pain is tolerable and she is able to do her daily activities at the current level of pain.  She has no right-sided  symptoms.  Denies paresthesias and numbness.  Previous treatments: physical therapy, activity modification, cane use, Tylenol, gabapentin, NSAIDs, L4/5 transforaminal injection  Physical Exam:  General: no acute distress, appears stated age Neurologic: alert, answering questions appropriately, following commands Respiratory: unlabored breathing on room air, symmetric chest rise Psychiatric: appropriate affect, normal cadence to speech   MSK (spine):  -Strength exam      Left  Right EHL    5/5  5/5 TA    5/5  5/5 GSC    5/5  5/5 Knee extension  5/5  5/5 Hip flexion   5/5  5/5  -Sensory exam    Sensation intact to light touch in L3-S1 nerve distributions of bilateral lower extremities  -Achilles DTR: 1/4 on the left, 1/4 on the right -Patellar tendon DTR: 1/4 on the left, 1/4 on the right  -Straight leg raise: Negative -Contralateral straight leg raise: Negative -Femoral nerve stretch test: Negative bilaterally -Clonus: no beats bilaterally  -Left hip exam: No pain through range of motion, positive Corky Sox, positive SI compression test, negative Stinchfield  Imaging: XR of the lumbar spine from 08/09/2021 and 12/29/2021 was previously independently reviewed and interpreted, showing no fracture or dislocation. No evidence of instability on flexion/extension. No significant degenerative changes.     MRI (on disc) of the lumbar spine from 02/09/2022 was previously independently reviewed and interpreted, showing disc bulges at L3/4 and L4/5 that are slightly larger on the left side. Mild foraminal stenosis at L3/4 and L4/5.    Patient name: Kelly Santana Patient MRN: [id number] Date of visit: 05/12/22

## 2022-05-20 DIAGNOSIS — N840 Polyp of corpus uteri: Secondary | ICD-10-CM | POA: Diagnosis not present

## 2022-05-20 DIAGNOSIS — Z01818 Encounter for other preprocedural examination: Secondary | ICD-10-CM | POA: Diagnosis not present

## 2022-05-20 NOTE — Telephone Encounter (Signed)
Spoke with patient in f/u to surgery.  Patient states referral for surgery was required by [place], [facility]. Patient states she is now scheduled for surgery with the [facility] on 05/31/22. Patient aware to contact office if she needs any additional assistance.   Routing to provider for final review. Patient is agreeable to disposition. Will close encounter.   Cc: Jonelle Sidle, NP

## 2022-05-23 DIAGNOSIS — N840 Polyp of corpus uteri: Secondary | ICD-10-CM | POA: Diagnosis not present

## 2022-05-23 DIAGNOSIS — Z01812 Encounter for preprocedural laboratory examination: Secondary | ICD-10-CM | POA: Diagnosis not present

## 2022-05-23 DIAGNOSIS — N950 Postmenopausal bleeding: Secondary | ICD-10-CM | POA: Diagnosis not present

## 2022-05-23 DIAGNOSIS — Z01818 Encounter for other preprocedural examination: Secondary | ICD-10-CM | POA: Diagnosis not present

## 2022-05-25 ENCOUNTER — Other Ambulatory Visit: Payer: Self-pay | Admitting: Family Medicine

## 2022-05-26 ENCOUNTER — Other Ambulatory Visit: Payer: Self-pay

## 2022-05-26 MED ORDER — MAGNESIUM OXIDE 400 MG PO TABS
400.0000 mg | ORAL_TABLET | Freq: Two times a day (BID) | ORAL | 1 refills | Status: AC
  Filled 2022-05-26 – 2022-05-27 (×2): qty 60, 30d supply, fill #0

## 2022-05-27 ENCOUNTER — Encounter: Payer: Self-pay | Admitting: Physical Medicine and Rehabilitation

## 2022-05-27 ENCOUNTER — Other Ambulatory Visit: Payer: Self-pay

## 2022-05-28 ENCOUNTER — Encounter: Payer: Self-pay | Admitting: Orthopedic Surgery

## 2022-05-28 DIAGNOSIS — M5416 Radiculopathy, lumbar region: Secondary | ICD-10-CM

## 2022-05-30 ENCOUNTER — Other Ambulatory Visit: Payer: Self-pay

## 2022-06-01 ENCOUNTER — Other Ambulatory Visit: Payer: Self-pay

## 2022-06-02 ENCOUNTER — Telehealth: Payer: Self-pay | Admitting: Physical Medicine and Rehabilitation

## 2022-06-02 NOTE — Telephone Encounter (Signed)
Patient waiting on a call to get her cortisone injection. Please call.

## 2022-06-02 NOTE — Telephone Encounter (Signed)
See referral notes.

## 2022-06-03 DIAGNOSIS — N858 Other specified noninflammatory disorders of uterus: Secondary | ICD-10-CM | POA: Diagnosis not present

## 2022-06-03 DIAGNOSIS — N950 Postmenopausal bleeding: Secondary | ICD-10-CM | POA: Diagnosis not present

## 2022-06-03 DIAGNOSIS — D250 Submucous leiomyoma of uterus: Secondary | ICD-10-CM | POA: Diagnosis not present

## 2022-06-03 DIAGNOSIS — N840 Polyp of corpus uteri: Secondary | ICD-10-CM | POA: Diagnosis not present

## 2022-06-06 ENCOUNTER — Other Ambulatory Visit: Payer: Self-pay

## 2022-06-09 ENCOUNTER — Other Ambulatory Visit: Payer: Self-pay

## 2022-06-09 ENCOUNTER — Encounter: Payer: Self-pay | Admitting: Family Medicine

## 2022-06-09 ENCOUNTER — Ambulatory Visit: Payer: BC Managed Care – PPO | Attending: Family Medicine | Admitting: Family Medicine

## 2022-06-09 VITALS — BP 100/67 | HR 86 | Temp 99.1°F | Ht 65.0 in | Wt 184.2 lb

## 2022-06-09 DIAGNOSIS — Z794 Long term (current) use of insulin: Secondary | ICD-10-CM | POA: Diagnosis not present

## 2022-06-09 DIAGNOSIS — E1142 Type 2 diabetes mellitus with diabetic polyneuropathy: Secondary | ICD-10-CM

## 2022-06-09 DIAGNOSIS — E1143 Type 2 diabetes mellitus with diabetic autonomic (poly)neuropathy: Secondary | ICD-10-CM | POA: Diagnosis not present

## 2022-06-09 DIAGNOSIS — E1159 Type 2 diabetes mellitus with other circulatory complications: Secondary | ICD-10-CM | POA: Diagnosis not present

## 2022-06-09 DIAGNOSIS — E1169 Type 2 diabetes mellitus with other specified complication: Secondary | ICD-10-CM

## 2022-06-09 DIAGNOSIS — E876 Hypokalemia: Secondary | ICD-10-CM

## 2022-06-09 DIAGNOSIS — I152 Hypertension secondary to endocrine disorders: Secondary | ICD-10-CM

## 2022-06-09 DIAGNOSIS — K3184 Gastroparesis: Secondary | ICD-10-CM

## 2022-06-09 DIAGNOSIS — E785 Hyperlipidemia, unspecified: Secondary | ICD-10-CM

## 2022-06-09 LAB — POCT GLYCOSYLATED HEMOGLOBIN (HGB A1C): HbA1c, POC (controlled diabetic range): 10.5 % — AB (ref 0.0–7.0)

## 2022-06-09 MED ORDER — METOCLOPRAMIDE HCL 10 MG PO TABS
10.0000 mg | ORAL_TABLET | Freq: Three times a day (TID) | ORAL | 1 refills | Status: DC | PRN
  Filled 2022-06-09: qty 90, 30d supply, fill #0
  Filled 2023-05-02: qty 90, 30d supply, fill #1

## 2022-06-09 MED ORDER — POTASSIUM CHLORIDE CRYS ER 20 MEQ PO TBCR
30.0000 meq | EXTENDED_RELEASE_TABLET | Freq: Every day | ORAL | 1 refills | Status: DC
  Filled 2022-06-09: qty 135, 90d supply, fill #0
  Filled 2022-06-13: qty 45, 30d supply, fill #0
  Filled 2022-07-11: qty 45, 30d supply, fill #1
  Filled 2022-08-17: qty 45, 30d supply, fill #2
  Filled 2022-09-12: qty 45, 30d supply, fill #3
  Filled 2022-10-04 – 2022-10-10 (×2): qty 45, 30d supply, fill #4
  Filled 2022-11-14: qty 45, 30d supply, fill #5

## 2022-06-09 MED ORDER — INSULIN GLARGINE-YFGN 100 UNIT/ML ~~LOC~~ SOPN
PEN_INJECTOR | SUBCUTANEOUS | 6 refills | Status: DC
  Filled 2022-06-09: qty 30, fill #0
  Filled 2022-07-10: qty 30, 23d supply, fill #0
  Filled 2022-08-17: qty 30, 23d supply, fill #1
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
## 2022-06-09 NOTE — Patient Instructions (Signed)

## 2022-06-13 ENCOUNTER — Other Ambulatory Visit (HOSPITAL_COMMUNITY): Payer: Self-pay

## 2022-06-13 ENCOUNTER — Other Ambulatory Visit: Payer: Self-pay

## 2022-07-01 DIAGNOSIS — Z4889 Encounter for other specified surgical aftercare: Secondary | ICD-10-CM | POA: Diagnosis not present

## 2022-07-01 DIAGNOSIS — D250 Submucous leiomyoma of uterus: Secondary | ICD-10-CM | POA: Diagnosis not present

## 2022-07-04 ENCOUNTER — Telehealth: Payer: Self-pay

## 2022-07-04 NOTE — Telephone Encounter (Signed)
Spoke with patient and scheduled injection for 07/11/22. Patient aware driver needed. Patient states she does not have a driver and didn't have one before

## 2022-07-11 ENCOUNTER — Ambulatory Visit (INDEPENDENT_AMBULATORY_CARE_PROVIDER_SITE_OTHER): Payer: BC Managed Care – PPO | Admitting: Physical Medicine and Rehabilitation

## 2022-07-11 ENCOUNTER — Other Ambulatory Visit: Payer: Self-pay

## 2022-07-11 VITALS — BP 137/85 | HR 87

## 2022-07-11 DIAGNOSIS — M5416 Radiculopathy, lumbar region: Secondary | ICD-10-CM

## 2022-07-11 MED ORDER — METHYLPREDNISOLONE ACETATE 80 MG/ML IJ SUSP
80.0000 mg | Freq: Once | INTRAMUSCULAR | Status: AC
  Administered 2022-07-11: 80 mg

## 2022-07-11 NOTE — Patient Instructions (Signed)

## 2022-07-11 NOTE — Progress Notes (Signed)
Functional Pain Scale - descriptive words and definitions  Moderate (4)   Constantly aware of pain, can complete ADLs with modification/sleep marginally affected at times/passive distraction is of no use, but active distraction gives some relief. Moderate range order  Average Pain  varies   +Driver, -BT, -Dye Allergies.  Lower back pain on left side that is radiating into left leg at times

## 2022-07-12 ENCOUNTER — Other Ambulatory Visit: Payer: Self-pay

## 2022-07-13 ENCOUNTER — Other Ambulatory Visit: Payer: Self-pay

## 2022-07-14 ENCOUNTER — Other Ambulatory Visit: Payer: Self-pay

## 2022-07-19 NOTE — Procedures (Signed)
Lumbosacral Transforaminal Epidural Steroid Injection - Sub-Pedicular Approach with Fluoroscopic Guidance  Patient: Kelly Santana      Date of Birth: 1960/08/27 MRN: [id number] PCP: Hoy Register, MD      Visit Date: 07/11/2022   Universal Protocol:    Date/Time: 07/11/2022  Consent Given By: the patient  Position: PRONE  Additional Comments: Vital signs were monitored before and after the procedure. Patient was prepped and draped in the usual sterile fashion. The correct patient, procedure, and site was verified.   Injection Procedure Details:   Procedure diagnoses: Lumbar radiculopathy [M54.16]    Meds Administered:  Meds ordered this encounter  Medications   methylPREDNISolone acetate (DEPO-MEDROL) injection 80 mg    Laterality: Left  Location/Site: L4  Needle:5.0 in., 22 ga.  Short bevel or Quincke spinal needle  Needle Placement: Transforaminal  Findings:    -Comments: Excellent flow of contrast along the nerve, nerve root and into the epidural space.  Procedure Details: After squaring off the end-plates to get a true AP view, the C-arm was positioned so that an oblique view of the foramen as noted above was visualized. The target area is just inferior to the "nose of the scotty dog" or sub pedicular. The soft tissues overlying this structure were infiltrated with 2-3 ml. of 1% Lidocaine without Epinephrine.  The spinal needle was inserted toward the target using a "trajectory" view along the fluoroscope beam.  Under AP and lateral visualization, the needle was advanced so it did not puncture dura and was located close the 6 O'Clock position of the pedical in AP tracterory. Biplanar projections were used to confirm position. Aspiration was confirmed to be negative for CSF and/or blood. A 1-2 ml. volume of Isovue-250 was injected and flow of contrast was noted at each level. Radiographs were obtained for documentation purposes.   After attaining the desired flow  of contrast documented above, a 0.5 to 1.0 ml test dose of 0.25% Marcaine was injected into each respective transforaminal space.  The patient was observed for 90 seconds post injection.  After no sensory deficits were reported, and normal lower extremity motor function was noted,   the above injectate was administered so that equal amounts of the injectate were placed at each foramen (level) into the transforaminal epidural space.   Additional Comments:  No complications occurred Dressing: 2 x 2 sterile gauze and Band-Aid    Post-procedure details: Patient was observed during the procedure. Post-procedure instructions were reviewed.  Patient left the clinic in stable condition.

## 2022-07-19 NOTE — Progress Notes (Signed)
Kelly Santana - 62 y.o. female MRN [id number]  Date of birth: March 07, 1960  Office Visit Note: Visit Date: 07/11/2022 PCP: Hoy Register, MD Referred by: London Sheer, MD  Subjective: Chief Complaint  Patient presents with   Lower Back - Pain   HPI:  Kelly Santana is a 62 y.o. female who comes in today at the request of Dr. Willia Craze for planned Left L4-5 Lumbar Transforaminal epidural steroid injection with fluoroscopic guidance.  The patient has failed conservative care including home exercise, medications, time and activity modification.  This injection will be diagnostic and hopefully therapeutic.  Please see requesting physician notes for further details and justification.   ROS Otherwise per HPI.  Assessment & Plan: Visit Diagnoses:    ICD-10-CM   1. Lumbar radiculopathy  M54.16 XR C-ARM NO REPORT    Epidural Steroid injection    methylPREDNISolone acetate (DEPO-MEDROL) injection 80 mg      Plan: No additional findings.   Meds & Orders:  Meds ordered this encounter  Medications   methylPREDNISolone acetate (DEPO-MEDROL) injection 80 mg    Orders Placed This Encounter  Procedures   XR C-ARM NO REPORT   Epidural Steroid injection    Follow-up: Return for visit to requesting provider as needed.   Procedures: No procedures performed  Lumbosacral Transforaminal Epidural Steroid Injection - Sub-Pedicular Approach with Fluoroscopic Guidance  Patient: Kelly Santana      Date of Birth: [birth year] MRN: [id number] PCP: Hoy Register, MD      Visit Date: 07/11/2022   Universal Protocol:    Date/Time: 07/11/2022  Consent Given By: the patient  Position: PRONE  Additional Comments: Vital signs were monitored before and after the procedure. Patient was prepped and draped in the usual sterile fashion. The correct patient, procedure, and site was verified.   Injection Procedure Details:   Procedure diagnoses: Lumbar radiculopathy [M54.16]     Meds Administered:  Meds ordered this encounter  Medications   methylPREDNISolone acetate (DEPO-MEDROL) injection 80 mg    Laterality: Left  Location/Site: L4  Needle:5.0 in., 22 ga.  Short bevel or Quincke spinal needle  Needle Placement: Transforaminal  Findings:    -Comments: Excellent flow of contrast along the nerve, nerve root and into the epidural space.  Procedure Details: After squaring off the end-plates to get a true AP view, the C-arm was positioned so that an oblique view of the foramen as noted above was visualized. The target area is just inferior to the "nose of the scotty dog" or sub pedicular. The soft tissues overlying this structure were infiltrated with 2-3 ml. of 1% Lidocaine without Epinephrine.  The spinal needle was inserted toward the target using a "trajectory" view along the fluoroscope beam.  Under AP and lateral visualization, the needle was advanced so it did not puncture dura and was located close the 6 O'Clock position of the pedical in AP tracterory. Biplanar projections were used to confirm position. Aspiration was confirmed to be negative for CSF and/or blood. A 1-2 ml. volume of Isovue-250 was injected and flow of contrast was noted at each level. Radiographs were obtained for documentation purposes.   After attaining the desired flow of contrast documented above, a 0.5 to 1.0 ml test dose of 0.25% Marcaine was injected into each respective transforaminal space.  The patient was observed for 90 seconds post injection.  After no sensory deficits were reported, and normal lower extremity motor function was noted,   the above injectate was administered  so that equal amounts of the injectate were placed at each foramen (level) into the transforaminal epidural space.   Additional Comments:  No complications occurred Dressing: 2 x 2 sterile gauze and Band-Aid    Post-procedure details: Patient was observed during the procedure. Post-procedure  instructions were reviewed.  Patient left the clinic in stable condition.    Clinical History: MRI lumbar spine:   TECHNIQUE: Sagittal and axial T1 and T2-weighted sequences were performed. Additional sagittal STIR images were performed.   INDICATION: Low back pain   COMPARISON: None   FINDINGS:  # Lumbar alignment is preserved.  #  Vertebral body heights are well maintained.  #  L2-3 trace type I Modic endplate change.  #  Conus terminates at T12-L1 without evidence of tethering.  #  Nerve roots appear normal.  #  Incidental findings: 2.7 cm left renal cyst.    #  L1-2: Mild degenerative disc disease. There is disc bulge without significant central canal stenosis or neuroforaminal narrowing.  #   #  L2-3: Mild degenerative disc disease. Facet arthropathy and disc bulge causing minimal central canal lateral recess stenosis with moderate bilateral foraminal narrowing.  #   #  L3-4: Mild degenerative disc disease. Facet arthropathy and disc bulge causing mild bilateral foraminal narrowing. No significant central canal stenosis.  #   #  L4-5: Facet arthropathy and disc bulge causing mild bilateral foraminal narrowing. No significant central canal stenosis.  #   #  L5-S1: Transitional anatomy with partial sacralization of L5. Central canal is well-maintained. Neuroforamina are patent.    IMPRESSION:   Multilevel facet arthropathy and disc bulging causing minimal central canal lateral recess stenosis with moderate neuroforaminal narrowing at L2-3 and mild neuroforaminal narrowing L3-4 and L4-5 as above.   Electronically Signed by: Abner Greenspan, MD on 02/11/2022 2:02 PM     Objective:  VS:  HT:    WT:   BMI:     BP:137/85  HR:87bpm  TEMP: ( )  RESP:  Physical Exam Vitals and nursing note reviewed.  Constitutional:      General: She is not in acute distress.    Appearance: Normal appearance. She is not ill-appearing.  HENT:     Head: Normocephalic and atraumatic.      Right Ear: External ear normal.     Left Ear: External ear normal.  Eyes:     Extraocular Movements: Extraocular movements intact.  Cardiovascular:     Rate and Rhythm: Normal rate.     Pulses: Normal pulses.  Pulmonary:     Effort: Pulmonary effort is normal. No respiratory distress.  Abdominal:     General: There is no distension.     Palpations: Abdomen is soft.  Musculoskeletal:        General: Tenderness present.     Cervical back: Neck supple.     Right lower leg: No edema.     Left lower leg: No edema.     Comments: Patient has good distal strength with no pain over the greater trochanters.  No clonus or focal weakness.  Skin:    Findings: No erythema, lesion or rash.  Neurological:     General: No focal deficit present.     Mental Status: She is alert and oriented to person, place, and time.     Sensory: No sensory deficit.     Motor: No weakness or abnormal muscle tone.     Coordination: Coordination normal.  Psychiatric:  Mood and Affect: Mood normal.        Behavior: Behavior normal.      Imaging: No results found.

## 2022-07-26 ENCOUNTER — Other Ambulatory Visit: Payer: Self-pay

## 2022-07-27 ENCOUNTER — Other Ambulatory Visit: Payer: Self-pay

## 2022-07-28 ENCOUNTER — Other Ambulatory Visit: Payer: Self-pay

## 2022-08-17 ENCOUNTER — Other Ambulatory Visit: Payer: Self-pay

## 2022-08-19 ENCOUNTER — Encounter: Payer: Self-pay | Admitting: *Deleted

## 2022-08-19 NOTE — Progress Notes (Signed)
[facility] Quality Team Note  Name: Kelly Santana Date of Birth: [birth year] MRN: [id number] Date: 08/19/2022  [facility] Quality Team has reviewed this patient's chart, please see recommendations below:  [facility] Quality Other; Pt has open gaps for mammogram and A1C (last one out of range).  Would provider be able to address and order at ov on 09/08/2022?

## 2022-09-02 ENCOUNTER — Other Ambulatory Visit: Payer: Self-pay

## 2022-09-08 ENCOUNTER — Encounter: Payer: Self-pay | Admitting: Family Medicine

## 2022-09-08 ENCOUNTER — Ambulatory Visit: Payer: BC Managed Care – PPO | Attending: Family Medicine | Admitting: Family Medicine

## 2022-09-08 ENCOUNTER — Other Ambulatory Visit: Payer: Self-pay

## 2022-09-08 VITALS — BP 117/76 | HR 78 | Temp 98.0°F | Ht 65.0 in | Wt 182.6 lb

## 2022-09-08 DIAGNOSIS — M533 Sacrococcygeal disorders, not elsewhere classified: Secondary | ICD-10-CM

## 2022-09-08 DIAGNOSIS — E1142 Type 2 diabetes mellitus with diabetic polyneuropathy: Secondary | ICD-10-CM

## 2022-09-08 DIAGNOSIS — E1169 Type 2 diabetes mellitus with other specified complication: Secondary | ICD-10-CM | POA: Diagnosis not present

## 2022-09-08 DIAGNOSIS — E785 Hyperlipidemia, unspecified: Secondary | ICD-10-CM

## 2022-09-08 DIAGNOSIS — E1159 Type 2 diabetes mellitus with other circulatory complications: Secondary | ICD-10-CM

## 2022-09-08 DIAGNOSIS — K3184 Gastroparesis: Secondary | ICD-10-CM

## 2022-09-08 DIAGNOSIS — Z794 Long term (current) use of insulin: Secondary | ICD-10-CM | POA: Diagnosis not present

## 2022-09-08 DIAGNOSIS — N950 Postmenopausal bleeding: Secondary | ICD-10-CM

## 2022-09-08 DIAGNOSIS — E1143 Type 2 diabetes mellitus with diabetic autonomic (poly)neuropathy: Secondary | ICD-10-CM

## 2022-09-08 DIAGNOSIS — I152 Hypertension secondary to endocrine disorders: Secondary | ICD-10-CM

## 2022-09-08 LAB — POCT GLYCOSYLATED HEMOGLOBIN (HGB A1C): HbA1c, POC (controlled diabetic range): 9.5 % — AB (ref 0.0–7.0)

## 2022-09-08 LAB — GLUCOSE, POCT (MANUAL RESULT ENTRY): POC Glucose: 225 mg/dl — AB (ref 70–99)

## 2022-09-08 MED ORDER — HYDROCHLOROTHIAZIDE 25 MG PO TABS
25.0000 mg | ORAL_TABLET | Freq: Every day | ORAL | 1 refills | Status: DC
Start: 2022-09-08 — End: 2022-11-01
  Filled 2022-09-08: qty 90, 90d supply, fill #0

## 2022-09-08 MED ORDER — GLIMEPIRIDE 4 MG PO TABS
4.0000 mg | ORAL_TABLET | Freq: Two times a day (BID) | ORAL | 1 refills | Status: DC
Start: 2022-09-08 — End: 2023-01-11
  Filled 2022-09-08 – 2022-10-03 (×2): qty 180, 90d supply, fill #0
  Filled 2023-01-09: qty 180, 90d supply, fill #1
  Filled 2023-01-11: qty 60, 30d supply, fill #1

## 2022-09-08 MED ORDER — INSULIN GLARGINE-YFGN 100 UNIT/ML ~~LOC~~ SOPN
PEN_INJECTOR | SUBCUTANEOUS | 6 refills | Status: DC
Start: 2022-09-08 — End: 2023-01-11
  Filled 2022-09-08: qty 30, 33d supply, fill #0
  Filled 2022-10-13: qty 30, 33d supply, fill #1
  Filled 2022-12-06: qty 30, 33d supply, fill #2
  Filled 2023-01-09: qty 30, 33d supply, fill #3

## 2022-09-08 MED ORDER — ATORVASTATIN CALCIUM 40 MG PO TABS
40.0000 mg | ORAL_TABLET | Freq: Every day | ORAL | 1 refills | Status: DC
Start: 2022-09-08 — End: 2022-11-01
  Filled 2022-09-08: qty 90, 90d supply, fill #0

## 2022-09-08 MED ORDER — BENAZEPRIL HCL 40 MG PO TABS
40.0000 mg | ORAL_TABLET | Freq: Every day | ORAL | 1 refills | Status: DC
Start: 2022-09-08 — End: 2023-05-11
  Filled 2022-09-08 – 2022-12-06 (×2): qty 90, 90d supply, fill #0
  Filled 2023-02-26: qty 90, 90d supply, fill #1

## 2022-09-08 MED ORDER — SPIRONOLACTONE 25 MG PO TABS
25.0000 mg | ORAL_TABLET | Freq: Every day | ORAL | 1 refills | Status: DC
Start: 2022-09-08 — End: 2023-05-17
  Filled 2022-09-08 – 2022-12-06 (×2): qty 90, 90d supply, fill #0
  Filled 2023-02-26: qty 90, 90d supply, fill #1
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
## 2022-09-08 NOTE — Addendum Note (Signed)
Addended by: Ronette Deter on: 09/08/2022 10:00 AM   Modules accepted: Orders

## 2022-09-08 NOTE — Patient Instructions (Signed)
Exercising to Stay Healthy To become healthy and stay healthy, it is recommended that you do moderate-intensity and vigorous-intensity exercise. You can tell that you are exercising at a moderate intensity if your heart starts beating faster and you start breathing faster but can still hold a conversation. You can tell that you are exercising at a vigorous intensity if you are breathing much harder and faster and cannot hold a conversation while exercising. How can exercise benefit me? Exercising regularly is important. It has many health benefits, such as: Improving overall fitness, flexibility, and endurance. Increasing bone density. Helping with weight control. Decreasing body fat. Increasing muscle strength and endurance. Reducing stress and tension, anxiety, depression, or anger. Improving overall health. What guidelines should I follow while exercising? Before you start a new exercise program, talk with your health care provider. Do not exercise so much that you hurt yourself, feel dizzy, or get very short of breath. Wear comfortable clothes and wear shoes with good support. Drink plenty of water while you exercise to prevent dehydration or heat stroke. Work out until your breathing and your heartbeat get faster (moderate intensity). How often should I exercise? Choose an activity that you enjoy, and set realistic goals. Your health care provider can help you make an activity plan that is individually designed and works best for you. Exercise regularly as told by your health care provider. This may include: Doing strength training two times a week, such as: Lifting weights. Using resistance bands. Push-ups. Sit-ups. Yoga. Doing a certain intensity of exercise for a given amount of time. Choose from these options: A total of 150 minutes of moderate-intensity exercise every week. A total of 75 minutes of vigorous-intensity exercise every week. A mix of moderate-intensity and  vigorous-intensity exercise every week. Children, pregnant women, people who have not exercised regularly, people who are overweight, and older adults may need to talk with a health care provider about what activities are safe to perform. If you have a medical condition, be sure to talk with your health care provider before you start a new exercise program. What are some exercise ideas? Moderate-intensity exercise ideas include: Walking 1 mile (1.6 km) in about 15 minutes. Biking. Hiking. Golfing. Dancing. Water aerobics. Vigorous-intensity exercise ideas include: Walking 4.5 miles (7.2 km) or more in about 1 hour. Jogging or running 5 miles (8 km) in about 1 hour. Biking 10 miles (16.1 km) or more in about 1 hour. Lap swimming. Roller-skating or in-line skating. Cross-country skiing. Vigorous competitive sports, such as football, basketball, and soccer. Jumping rope. Aerobic dancing. What are some everyday activities that can help me get exercise? Yard work, such as: Pushing a lawn mower. Raking and bagging leaves. Washing your car. Pushing a stroller. Shoveling snow. Gardening. Washing windows or floors. How can I be more active in my day-to-day activities? Use stairs instead of an elevator. Take a walk during your lunch break. If you drive, park your car farther away from your work or school. If you take public transportation, get off one stop early and walk the rest of the way. Stand up or walk around during all of your indoor phone calls. Get up, stretch, and walk around every 30 minutes throughout the day. Enjoy exercise with a friend. Support to continue exercising will help you keep a regular routine of activity. Where to find more information You can find more information about exercising to stay healthy from: U.S. Department of Health and Human Services: www.hhs.gov Centers for Disease Control and Prevention (  CDC): www.cdc.gov Summary Exercising regularly is  important. It will improve your overall fitness, flexibility, and endurance. Regular exercise will also improve your overall health. It can help you control your weight, reduce stress, and improve your bone density. Do not exercise so much that you hurt yourself, feel dizzy, or get very short of breath. Before you start a new exercise program, talk with your health care provider. This information is not intended to replace advice given to you by your health care provider. Make sure you discuss any questions you have with your health care provider. Document Revised: 06/12/2020 Document Reviewed: 06/12/2020 Elsevier Patient Education  2024 Elsevier Inc.  

## 2022-09-09 LAB — CMP14+EGFR
ALT: 27 IU/L (ref 0–32)
AST: 24 IU/L (ref 0–40)
Albumin: 4.6 g/dL (ref 3.8–4.9)
Alkaline Phosphatase: 96 IU/L (ref 44–121)
BUN/Creatinine Ratio: 17 (ref 12–28)
BUN: 20 mg/dL (ref 8–27)
Bilirubin Total: 0.5 mg/dL (ref 0.0–1.2)
CO2: 25 mmol/L (ref 20–29)
Calcium: 10 mg/dL (ref 8.7–10.3)
Chloride: 96 mmol/L (ref 96–106)
Creatinine, Ser: 1.15 mg/dL — ABNORMAL HIGH (ref 0.57–1.00)
Globulin, Total: 2.4 g/dL (ref 1.5–4.5)
Glucose: 236 mg/dL — ABNORMAL HIGH (ref 70–99)
Potassium: 4.6 mmol/L (ref 3.5–5.2)
Sodium: 138 mmol/L (ref 134–144)
Total Protein: 7 g/dL (ref 6.0–8.5)
eGFR: 55 mL/min/{1.73_m2} — ABNORMAL LOW (ref >59)

## 2022-09-09 LAB — LP+NON-HDL CHOLESTEROL
Cholesterol, Total: 154 mg/dL (ref 100–199)
HDL: 37 mg/dL — ABNORMAL LOW (ref >39)
LDL Chol Calc (NIH): 57 mg/dL (ref 0–99)
Total Non-HDL-Chol (LDL+VLDL): 117 mg/dL (ref 0–129)
Triglycerides: 390 mg/dL — ABNORMAL HIGH (ref 0–149)
VLDL Cholesterol Cal: 60 mg/dL — ABNORMAL HIGH (ref 5–40)

## 2022-09-09 LAB — MICROALBUMIN / CREATININE URINE RATIO
Creatinine, Urine: 125.6 mg/dL
Microalb/Creat Ratio: 12 mg/g creat (ref 0–29)
Microalbumin, Urine: 15.5 ug/mL

## 2022-09-12 ENCOUNTER — Other Ambulatory Visit: Payer: Self-pay

## 2022-09-12 ENCOUNTER — Other Ambulatory Visit: Payer: Self-pay | Admitting: Family Medicine

## 2022-09-12 DIAGNOSIS — E781 Pure hyperglyceridemia: Secondary | ICD-10-CM

## 2022-09-12 MED ORDER — ICOSAPENT ETHYL 1 G PO CAPS
2.0000 g | ORAL_CAPSULE | Freq: Two times a day (BID) | ORAL | 6 refills | Status: AC
  Filled 2022-09-12: qty 120, 30d supply, fill #0

## 2022-09-13 DIAGNOSIS — H2513 Age-related nuclear cataract, bilateral: Secondary | ICD-10-CM | POA: Diagnosis not present

## 2022-09-13 DIAGNOSIS — H25012 Cortical age-related cataract, left eye: Secondary | ICD-10-CM | POA: Diagnosis not present

## 2022-09-13 DIAGNOSIS — E119 Type 2 diabetes mellitus without complications: Secondary | ICD-10-CM | POA: Diagnosis not present

## 2022-09-13 DIAGNOSIS — H524 Presbyopia: Secondary | ICD-10-CM | POA: Diagnosis not present

## 2022-09-13 DIAGNOSIS — N951 Menopausal and female climacteric states: Secondary | ICD-10-CM | POA: Diagnosis not present

## 2022-09-14 ENCOUNTER — Other Ambulatory Visit: Payer: Self-pay

## 2022-09-23 DIAGNOSIS — H524 Presbyopia: Secondary | ICD-10-CM | POA: Diagnosis not present

## 2022-10-03 ENCOUNTER — Other Ambulatory Visit: Payer: Self-pay

## 2022-10-04 ENCOUNTER — Other Ambulatory Visit: Payer: Self-pay

## 2022-10-04 ENCOUNTER — Encounter: Payer: Self-pay | Admitting: Family Medicine

## 2022-10-05 ENCOUNTER — Other Ambulatory Visit: Payer: Self-pay

## 2022-10-10 ENCOUNTER — Other Ambulatory Visit: Payer: Self-pay

## 2022-10-13 ENCOUNTER — Other Ambulatory Visit: Payer: Self-pay

## 2022-11-01 ENCOUNTER — Other Ambulatory Visit: Payer: Self-pay

## 2022-11-01 ENCOUNTER — Other Ambulatory Visit: Payer: Self-pay | Admitting: Family Medicine

## 2022-11-01 DIAGNOSIS — E1159 Type 2 diabetes mellitus with other circulatory complications: Secondary | ICD-10-CM

## 2022-11-01 DIAGNOSIS — E1169 Type 2 diabetes mellitus with other specified complication: Secondary | ICD-10-CM

## 2022-11-01 MED ORDER — ATORVASTATIN CALCIUM 40 MG PO TABS
40.0000 mg | ORAL_TABLET | Freq: Every day | ORAL | 1 refills | Status: DC
  Filled 2022-11-01: qty 90, 90d supply, fill #0
  Filled 2023-01-09: qty 90, 90d supply, fill #1

## 2022-11-01 MED ORDER — HYDROCHLOROTHIAZIDE 25 MG PO TABS
25.0000 mg | ORAL_TABLET | Freq: Every day | ORAL | 1 refills | Status: DC
Start: 2022-11-01 — End: 2023-01-11
  Filled 2022-11-01: qty 90, 90d supply, fill #0
  Filled 2023-01-09: qty 90, 90d supply, fill #1

## 2022-11-01 MED ORDER — ALBUTEROL SULFATE HFA 108 (90 BASE) MCG/ACT IN AERS
2.0000 | INHALATION_SPRAY | RESPIRATORY_TRACT | 1 refills | Status: DC | PRN
  Filled 2022-11-01: qty 8.5, 25d supply, fill #0
  Filled 2023-01-11 – 2023-11-01 (×2): qty 8.5, 25d supply, fill #1

## 2022-11-02 ENCOUNTER — Other Ambulatory Visit: Payer: Self-pay

## 2022-11-03 DIAGNOSIS — N898 Other specified noninflammatory disorders of vagina: Secondary | ICD-10-CM | POA: Diagnosis not present

## 2022-11-03 DIAGNOSIS — Z90710 Acquired absence of both cervix and uterus: Secondary | ICD-10-CM | POA: Diagnosis not present

## 2022-11-03 DIAGNOSIS — Z9889 Other specified postprocedural states: Secondary | ICD-10-CM | POA: Diagnosis not present

## 2022-11-14 ENCOUNTER — Other Ambulatory Visit: Payer: Self-pay

## 2022-11-16 ENCOUNTER — Other Ambulatory Visit: Payer: Self-pay

## 2022-12-06 ENCOUNTER — Other Ambulatory Visit: Payer: Self-pay | Admitting: Family Medicine

## 2022-12-06 ENCOUNTER — Other Ambulatory Visit: Payer: Self-pay

## 2022-12-06 DIAGNOSIS — E876 Hypokalemia: Secondary | ICD-10-CM

## 2022-12-06 MED ORDER — POTASSIUM CHLORIDE CRYS ER 20 MEQ PO TBCR
30.0000 meq | EXTENDED_RELEASE_TABLET | Freq: Every day | ORAL | 0 refills | Status: DC
  Filled 2022-12-06 – 2022-12-08 (×2): qty 135, 90d supply, fill #0

## 2022-12-08 ENCOUNTER — Other Ambulatory Visit: Payer: Self-pay

## 2022-12-09 ENCOUNTER — Other Ambulatory Visit: Payer: Self-pay

## 2022-12-12 ENCOUNTER — Ambulatory Visit: Payer: BC Managed Care – PPO | Attending: Family Medicine

## 2022-12-12 ENCOUNTER — Ambulatory Visit: Payer: BC Managed Care – PPO | Admitting: Family Medicine

## 2022-12-12 DIAGNOSIS — Z23 Encounter for immunization: Secondary | ICD-10-CM

## 2022-12-12 NOTE — Progress Notes (Signed)
Flu vaccine administered per protocols.  Information sheet given. Patient denies and pain or discomfort at injection site. Tolerated injection well no reaction.

## 2023-01-10 ENCOUNTER — Other Ambulatory Visit: Payer: Self-pay

## 2023-01-11 ENCOUNTER — Encounter: Payer: Self-pay | Admitting: Family Medicine

## 2023-01-11 ENCOUNTER — Other Ambulatory Visit: Payer: Self-pay

## 2023-01-11 ENCOUNTER — Ambulatory Visit: Payer: BC Managed Care – PPO | Attending: Family Medicine | Admitting: Family Medicine

## 2023-01-11 VITALS — BP 131/79 | HR 75 | Ht 65.0 in | Wt 186.0 lb

## 2023-01-11 DIAGNOSIS — E1159 Type 2 diabetes mellitus with other circulatory complications: Secondary | ICD-10-CM | POA: Diagnosis not present

## 2023-01-11 DIAGNOSIS — F419 Anxiety disorder, unspecified: Secondary | ICD-10-CM

## 2023-01-11 DIAGNOSIS — Z794 Long term (current) use of insulin: Secondary | ICD-10-CM | POA: Diagnosis not present

## 2023-01-11 DIAGNOSIS — E1169 Type 2 diabetes mellitus with other specified complication: Secondary | ICD-10-CM

## 2023-01-11 DIAGNOSIS — G8929 Other chronic pain: Secondary | ICD-10-CM

## 2023-01-11 DIAGNOSIS — E1142 Type 2 diabetes mellitus with diabetic polyneuropathy: Secondary | ICD-10-CM

## 2023-01-11 DIAGNOSIS — E1143 Type 2 diabetes mellitus with diabetic autonomic (poly)neuropathy: Secondary | ICD-10-CM

## 2023-01-11 DIAGNOSIS — M533 Sacrococcygeal disorders, not elsewhere classified: Secondary | ICD-10-CM

## 2023-01-11 DIAGNOSIS — K3184 Gastroparesis: Secondary | ICD-10-CM

## 2023-01-11 DIAGNOSIS — Z7984 Long term (current) use of oral hypoglycemic drugs: Secondary | ICD-10-CM

## 2023-01-11 DIAGNOSIS — I152 Hypertension secondary to endocrine disorders: Secondary | ICD-10-CM

## 2023-01-11 DIAGNOSIS — R29898 Other symptoms and signs involving the musculoskeletal system: Secondary | ICD-10-CM

## 2023-01-11 DIAGNOSIS — E785 Hyperlipidemia, unspecified: Secondary | ICD-10-CM

## 2023-01-11 DIAGNOSIS — M25511 Pain in right shoulder: Secondary | ICD-10-CM

## 2023-01-11 LAB — POCT GLYCOSYLATED HEMOGLOBIN (HGB A1C): HbA1c, POC (controlled diabetic range): 8.7 % — AB (ref 0.0–7.0)

## 2023-01-11 MED ORDER — GABAPENTIN 300 MG PO CAPS
600.0000 mg | ORAL_CAPSULE | Freq: Two times a day (BID) | ORAL | 1 refills | Status: DC
  Filled 2023-01-11 – 2023-04-09 (×2): qty 360, 90d supply, fill #0
  Filled 2023-09-04: qty 360, 90d supply, fill #1

## 2023-01-11 MED ORDER — HYDROCHLOROTHIAZIDE 25 MG PO TABS
25.0000 mg | ORAL_TABLET | Freq: Every day | ORAL | 1 refills | Status: DC
  Filled 2023-01-11 – 2023-04-12 (×2): qty 90, 90d supply, fill #0
  Filled 2023-07-24: qty 90, 90d supply, fill #1

## 2023-01-11 MED ORDER — ATORVASTATIN CALCIUM 40 MG PO TABS
40.0000 mg | ORAL_TABLET | Freq: Every day | ORAL | 1 refills | Status: DC
  Filled 2023-01-11 – 2023-04-12 (×2): qty 90, 90d supply, fill #0
  Filled 2023-07-24: qty 90, 90d supply, fill #1

## 2023-01-11 MED ORDER — INSULIN GLARGINE-YFGN 100 UNIT/ML ~~LOC~~ SOPN
PEN_INJECTOR | SUBCUTANEOUS | 6 refills | Status: DC
  Filled 2023-01-11 – 2023-02-26 (×2): qty 30, 36d supply, fill #0
  Filled 2023-04-09: qty 30, 36d supply, fill #1
  Filled 2023-05-11 – 2023-05-16 (×3): qty 30, 36d supply, fill #2

## 2023-01-11 MED ORDER — GLIMEPIRIDE 4 MG PO TABS
4.0000 mg | ORAL_TABLET | Freq: Two times a day (BID) | ORAL | 1 refills | Status: DC
  Filled 2023-01-11 – 2023-04-12 (×2): qty 180, 90d supply, fill #0
  Filled 2023-08-08: qty 180, 90d supply, fill #1

## 2023-01-11 MED ORDER — CARVEDILOL 25 MG PO TABS
25.0000 mg | ORAL_TABLET | Freq: Two times a day (BID) | ORAL | 1 refills | Status: DC
  Filled 2023-01-11 – 2023-08-08 (×2): qty 180, 90d supply, fill #0
  Filled 2023-11-01 (×2): qty 180, 90d supply, fill #1

## 2023-01-11 NOTE — Patient Instructions (Signed)
VISIT SUMMARY:  During today's visit, we discussed your ongoing health issues, including diabetes, hypertension, gastritis, sciatica, and new concerns about your elbow and shoulder. We reviewed your current medications and management strategies, and I provided recommendations for each condition.  YOUR PLAN:  -TYPE 2 DIABETES MELLITUS: Type 2 Diabetes Mellitus is a condition where your body does not use insulin properly, leading to high blood sugar levels. Your A1c has improved from 9.5 to 8.7, which is a positive sign. Please continue your current insulin regimen of 63 units in the morning and 20 units in the evening. Make sure to monitor your blood glucose levels consistently, especially during the holiday season. If your blood glucose is high, consider taking additional Novolog as needed.  -HYPERTENSION: Hypertension is high blood pressure, which can lead to serious health problems if not managed properly. Your blood pressure was elevated today, likely because you took your medication late. Please recheck your blood pressure today and try to take your medication at the same time every day.  -GASTROPARESIS: Gastroparesis is a condition that affects the stomach muscles and prevents proper stomach emptying. You reported occasional rumbling and nausea after eating, which you manage with nausea medication. Continue with your current management plan.  -CHRONIC BACK PAIN: Chronic back pain, including sciatica, can cause persistent discomfort and shooting pain down the leg. You mentioned that previous injections provided only temporary relief. I recommend a reevaluation by an orthopedic specialist to explore further management options.  -ELBOW AND SHOULDER PAIN: You reported a popping sensation in your elbow and pain in your shoulder, though there is no swelling or limited range of motion. I recommend an evaluation by an orthopedic specialist to determine the cause and appropriate treatment.  -GENERAL  HEALTH MAINTENANCE: Your flu vaccine is up to date. You are due for a mammogram, but you have declined it due to inconvenience. I encourage you to engage in regular exercise to maintain joint health.  INSTRUCTIONS:  Please recheck your blood pressure today. Schedule an appointment with an orthopedic specialist for your chronic back pain, elbow, and shoulder issues. Continue monitoring your blood glucose levels consistently and consider additional Novolog if needed. Maintain your current management plan for gastroparesis and [facility] as [facility].
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
## 2023-01-12 ENCOUNTER — Other Ambulatory Visit: Payer: Self-pay

## 2023-01-12 ENCOUNTER — Telehealth: Payer: BC Managed Care – PPO | Admitting: Physician Assistant

## 2023-01-12 ENCOUNTER — Ambulatory Visit: Payer: Self-pay

## 2023-01-12 DIAGNOSIS — H1032 Unspecified acute conjunctivitis, left eye: Secondary | ICD-10-CM

## 2023-01-12 MED ORDER — POLYMYXIN B-TRIMETHOPRIM 10000-0.1 UNIT/ML-% OP SOLN
1.0000 [drp] | Freq: Four times a day (QID) | OPHTHALMIC | 0 refills | Status: DC
  Filled 2023-01-12: qty 10, 25d supply, fill #0

## 2023-01-12 NOTE — Progress Notes (Signed)
Virtual Visit Consent   Kelly Santana, you are scheduled for a virtual visit with a [facility] provider today. Just as with appointments in the office, your consent must be obtained to participate. Your consent will be active for this visit and any virtual visit you may have with one of our providers in the next 365 days. If you have a MyChart account, a copy of this consent can be sent to you electronically.  As this is a virtual visit, video technology does not allow for your provider to perform a traditional examination. This may limit your provider's ability to fully assess your condition. If your provider identifies any concerns that need to be evaluated in person or the need to arrange testing (such as labs, EKG, etc.), we will make arrangements to do so. Although advances in technology are sophisticated, we cannot ensure that it will always work on either your end or our end. If the connection with a video visit is poor, the visit may have to be switched to a telephone visit. With either a video or telephone visit, we are not always able to ensure that we have a secure connection.  By engaging in this virtual visit, you consent to the provision of healthcare and authorize for your insurance to be billed (if applicable) for the services provided during this visit. Depending on your insurance coverage, you may receive a charge related to this service.  I need to obtain your verbal consent now. Are you willing to proceed with your visit today? Kelly Santana has provided verbal consent on 01/12/2023 for a virtual visit (video or telephone). Kelly Santana, [place]  Date: 01/12/2023 2:03 PM  Virtual Visit via Video Note   I, Kelly Santana, connected with  Kelly Santana  ([id number], Feb 08, 1961) on 01/12/23 at  2:00 PM EST by a video-enabled telemedicine application and verified that I am speaking with the correct person using two identifiers.  Location: Patient: Virtual Visit  Location Patient: Home Provider: Virtual Visit Location Provider: Home Office   I discussed the limitations of evaluation and management by telemedicine and the availability of in person appointments. The patient expressed understanding and agreed to proceed.    History of Present Illness: Kelly Santana is a 62 y.o. who identifies as a female who was assigned female at birth, and is being seen today for irritation of L eye first noted yesterday on waking. Endorses eye felt irritated and itchy but she just went on about her day. Today on waking noticed symptoms recurring with itching, irritation, drainage and redness. Denies crusting. Denies vision changes. Does not wear contact lenses.  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   Diabetes (HCC) 08/11/2006   Anxiety state 08/11/2006   DEPRESSION 08/11/2006   Essential hypertension 08/11/2006   ALLERGIC RHINITIS 08/11/2006   Asthma 08/11/2006   GERD 08/11/2006   SYMPTOM, DISTURBANCE, SLEEP NOS 08/11/2006    Allergies:  Allergies  Allergen Reactions   Invokamet [Canagliflozin-Metformin Hcl] Other (See Comments)    Caused DKA   Sulfonamide Derivatives Other (See Comments)    Levonne Spiller syndrome   Ibuprofen Swelling   Medications:  Current Outpatient Medications:    trimethoprim-polymyxin b (POLYTRIM) ophthalmic solution, Apply 1-2 drops into affected eye QID x 5 days., Disp: 10 mL, Rfl: 0   albuterol (VENTOLIN HFA) 108 (90 Base) MCG/ACT inhaler, Inhale 2 puffs into the lungs every 4 (four) hours as needed for wheezing or shortness of breath., Disp: 8.5 g, Rfl: 1   atorvastatin (LIPITOR) 40 MG tablet, Take 1  tablet (40 mg total) by mouth once daily., Disp: 90 tablet, Rfl: 1   benazepril (LOTENSIN) 40 MG tablet, Take 1 tablet (40 mg total) by mouth daily., Disp: 90 tablet, Rfl: 1   blood glucose meter kit and supplies KIT, Dispense based on patient and insurance preference. Use up to four times daily as directed. (FOR ICD-9 250.00, 250.01)., Disp: 1 each, Rfl: 0   carvedilol (COREG) 25 MG tablet, Take 1 tablet (25 mg total) by mouth 2 (two) times daily with a meal., Disp: 180 tablet, Rfl: 1   Continuous Blood Gluc Receiver (FREESTYLE LIBRE 2 READER) DEVI, Use as directed to check blood sugar three times daily, Disp: 1 each, Rfl: 0   Continuous Blood Gluc Sensor (FREESTYLE LIBRE 2 SENSOR) MISC, Use as directed to check blood sugar three times daily. Change sensor once every 14 days., Disp: 2 each, Rfl: 3   cyclobenzaprine (FLEXERIL) 10 MG tablet, Take 1 tablet (10 mg total) by mouth 2 (two) times daily as needed for muscle spasms., Disp: 60 tablet, Rfl: 2   diclofenac Sodium (VOLTAREN) 1 % GEL, Apply 4 g topically 4 (four) times daily., Disp: 100 g, Rfl: 1   DULoxetine (CYMBALTA) 30 MG capsule, Take 1 capsule (30 mg total) by mouth daily., Disp: 30 capsule, Rfl: 2   fluticasone (FLONASE) 50 MCG/ACT nasal spray, Place 2 sprays into both nostrils daily., Disp: 16 g, Rfl: 6   gabapentin (NEURONTIN) 300 MG capsule, Take 2 capsules (600 mg total) by mouth 2 (two) times daily., Disp: 360 capsule, Rfl: 1   glimepiride (AMARYL) 4 MG tablet, Take 1 tablet (4 mg total) by mouth 2 (two) times daily., Disp: 180 tablet, Rfl: 1   glucose blood (RELION GLUCOSE TEST STRIPS) test strip, Use as instructed, Disp: 100 each, Rfl: 5   hydrochlorothiazide (HYDRODIURIL) 25 MG tablet, Take 1 tablet (25 mg total) by mouth daily., Disp: 90 tablet, Rfl: 1   icosapent Ethyl (VASCEPA) 1 g capsule, Take 2 capsules (2 g total) by mouth 2 (two) times daily., Disp: 120 capsule, Rfl: 6   insulin glargine-yfgn (SEMGLEE, YFGN,) 100 UNIT/ML  Pen, Inject 63 Units into the skin in the morning AND 20 Units every evening. Increase by 2 units every fourth day until blood sugars are at goal.  Maximum daily dose of 90 units., Disp: 30 mL, Rfl: 6   insulin lispro (HUMALOG KWIKPEN) 100 UNIT/ML KwikPen, Inject 0-12 Units into the skin 3 (three) times daily., Disp: 15 mL, Rfl: 11   Insulin Pen Needle (TRUEPLUS 5-BEVEL PEN NEEDLES) 31G X 5 MM MISC, Use 5 times daily as needed with Insulin Glargine and Humalog., Disp: 100 each, Rfl: 6   Insulin Syringe-Needle U-100 31G X 5/16" 1 ML MISC, 1 each by Does not apply route 4 (four) times daily., Disp: 120 each, Rfl: 12   ipratropium-albuterol (DUONEB) 0.5-2.5 (3) MG/3ML SOLN, TAKE 3 MLS BY NEBULIZATION EVERY 6 (SIX) HOURS AS NEEDED., Disp: 90 mL, Rfl: 3   loratadine (CLARITIN) 10 MG tablet, Take 1 tablet (10 mg total) by mouth daily., Disp: 30 tablet, Rfl: 3  magnesium oxide (MAG-OX) 400 MG tablet, Take 1 tablet (400 mg total) by mouth in the morning and at bedtime., Disp: 60 tablet, Rfl: 1   metoCLOPramide (REGLAN) 10 MG tablet, Take 1 tablet (10 mg total) by mouth every 8 (eight) hours as needed for nausea., Disp: 90 tablet, Rfl: 1   mometasone-formoterol (DULERA) 100-5 MCG/ACT AERO, INHALE 2 PUFFS INTO THE LUNGS 2 (TWO) TIMES DAILY., Disp: 13 g, Rfl: 6   Multiple Vitamin (MULTIVITAMIN WITH MINERALS) TABS tablet, Take 1 tablet by mouth daily., Disp: , Rfl:    omeprazole (PRILOSEC) 20 MG capsule, Take 1 capsule (20 mg total) by mouth daily., Disp: 90 capsule, Rfl: 1   potassium chloride SA (KLOR-CON M) 20 MEQ tablet, TAKE 1.5 TABLETS (30 MEQ TOTAL) BY MOUTH ONCE DAILY., Disp: 135 tablet, Rfl: 0   RELION LANCETS MICRO-THIN 33G MISC, 1 each by Does not apply route at bedtime., Disp: 30 each, Rfl: 5   spironolactone (ALDACTONE) 25 MG tablet, Take 1 tablet (25 mg total) by mouth daily., Disp: 90 tablet, Rfl: 1   Syringe/Needle, Disp, (SYRINGE 3CC/21GX1-1/4") 21G X 1-1/4" 3 ML MISC, Inject 1 application as  directed daily., Disp: 30 each, Rfl: 0  Observations/Objective: Patient is well-developed, well-nourished in no acute distress.  Resting comfortably at home.  Head is normocephalic, atraumatic.  No labored breathing. Speech is clear and coherent with logical content.  Patient is alert and oriented at baseline.  L conjunctival injection noted with drainage. No noted matting of eyelids. No lid swelling or periorbital erythema noted. R eye within normal limits. EOMI. Pupils are equal and round.   Assessment and Plan: 1. Acute bacterial conjunctivitis of left eye - trimethoprim-polymyxin b (POLYTRIM) ophthalmic solution; Apply 1-2 drops into affected eye QID x 5 days.  Dispense: 10 mL; Refill: 0  Supportive measures and OTC medications reviewed. Polytrim per orders. Strict in-person follow-up precautions discussed with patient.   Follow Up Instructions: I discussed the assessment and treatment plan with the patient. The patient was provided an opportunity to ask questions and all were answered. The patient agreed with the plan and demonstrated an understanding of the instructions.  A copy of instructions were sent to the patient via MyChart unless otherwise noted below.   The patient was advised to call back or seek an in-person evaluation if the symptoms worsen or if the condition fails to improve as anticipated.    Kelly Climes, PA-C

## 2023-01-12 NOTE — Telephone Encounter (Signed)
Chief Complaint: Red Eye  Symptoms: Moderate itching, redness, drainage  Frequency: Constant onset Yesterday morning Pertinent Negatives: Patient denies fever, change in vision, eye pain  Disposition: [] ED /[] Urgent Care (no appt availability in office) / [] Appointment(In office/virtual)/ [x]  [facility]/ [] Home Care/ [] Refused Recommended Disposition /[] [facility]/ []  Follow-up with PCP Additional Notes: Patient stated she noticed eye drainage yesterday morning and had an appointment with PCP but did not mention it because she thought it was just allergies or something. This morning when the patient woke up the left eye was red with moderate itching. Patient states she had pink eye and the past and this feels the same. Care advice was given and patient has been  scheduled for a virtual urgent care visit due to no availability in the office this week.  Summary: Pink eye with drainage and itchy advice   Pt is calling to report her left eye is slightly red and itchy with drainage. Pt saw provider yesterday. No available appts. Please advise     Reason for Disposition  [1] Eye with yellow or green discharge, or eyelashes stick together AND [2] NO PCP standing order to call in antibiotic eye drops   (Exception: [place]; continue triage.)  Answer Assessment - Initial Assessment Questions 1. EYE DISCHARGE: "Is the discharge in one or both eyes?" "What color is it?" "How much is there?" "When did the discharge start?"      Left eye, its coming out like tears  2. REDNESS OF SCLERA: "Is the redness in one or both eyes?" "When did the redness start?"      Red on the left eye  3. EYELIDS: "Are the eyelids red or swollen?" If Yes, ask: "How much?"      No  4. VISION: "Is there any difficulty seeing clearly?"      Yes  5. PAIN: "Is there any pain? If Yes, ask: "How bad is it?" (Scale 1-10; or mild, moderate, severe)    - MILD (1-3): doesn't interfere with normal activities     -  MODERATE (4-7): interferes with normal activities or awakens from sleep    - SEVERE (8-10): excruciating pain, unable to do any normal activities       0/10 6. CONTACT LENS: "Do you wear contacts?"     No  7. OTHER SYMPTOMS: "Do you have any other symptoms?" (e.g., fever, runny nose, cough)     Moderate itching, red eye  Protocols used: Eye - Pus or Discharge-A-AH

## 2023-01-12 NOTE — Patient Instructions (Signed)
Kelly Santana, thank you for joining [name], PA-C for today's virtual visit.  While this provider is not your primary care provider (PCP), if your PCP is located in our provider database this encounter information will be shared with them immediately following your visit.   A [name] gives you access to today's visit and all your visits, tests, and labs performed at [facility] " click here if you don't have a [facility] MyChart account or go to [email or web address]  Consent: (Patient) Kelly Santana provided verbal consent for this virtual visit at the beginning of the encounter.  Current Medications:  Current Outpatient Medications:    albuterol (VENTOLIN HFA) 108 (90 Base) MCG/ACT inhaler, Inhale 2 puffs into the lungs every 4 (four) hours as needed for wheezing or shortness of breath., Disp: 8.5 g, Rfl: 1   atorvastatin (LIPITOR) 40 MG tablet, Take 1 tablet (40 mg total) by mouth once daily., Disp: 90 tablet, Rfl: 1   benazepril (LOTENSIN) 40 MG tablet, Take 1 tablet (40 mg total) by mouth daily., Disp: 90 tablet, Rfl: 1   blood glucose meter kit and supplies KIT, Dispense based on patient and insurance preference. Use up to four times daily as directed. (FOR ICD-9 250.00, 250.01)., Disp: 1 each, Rfl: 0   carvedilol (COREG) 25 MG tablet, Take 1 tablet (25 mg total) by mouth 2 (two) times daily with a meal., Disp: 180 tablet, Rfl: 1   Continuous Blood Gluc Receiver (FREESTYLE LIBRE 2 READER) DEVI, Use as directed to check blood sugar three times daily, Disp: 1 each, Rfl: 0   Continuous Blood Gluc Sensor (FREESTYLE LIBRE 2 SENSOR) MISC, Use as directed to check blood sugar three times daily. Change sensor once every 14 days., Disp: 2 each, Rfl: 3   cyclobenzaprine (FLEXERIL) 10 MG tablet, Take 1 tablet (10 mg total) by mouth 2 (two) times daily as needed for muscle spasms., Disp: 60 tablet, Rfl: 2   diclofenac Sodium (VOLTAREN) 1 % GEL,  Apply 4 g topically 4 (four) times daily., Disp: 100 g, Rfl: 1   DULoxetine (CYMBALTA) 30 MG capsule, Take 1 capsule (30 mg total) by mouth daily., Disp: 30 capsule, Rfl: 2   fluticasone (FLONASE) 50 MCG/ACT nasal spray, Place 2 sprays into both nostrils daily., Disp: 16 g, Rfl: 6   gabapentin (NEURONTIN) 300 MG capsule, Take 2 capsules (600 mg total) by mouth 2 (two) times daily., Disp: 360 capsule, Rfl: 1   glimepiride (AMARYL) 4 MG tablet, Take 1 tablet (4 mg total) by mouth 2 (two) times daily., Disp: 180 tablet, Rfl: 1   glucose blood (RELION GLUCOSE TEST STRIPS) test strip, Use as instructed, Disp: 100 each, Rfl: 5   hydrochlorothiazide (HYDRODIURIL) 25 MG tablet, Take 1 tablet (25 mg total) by mouth daily., Disp: 90 tablet, Rfl: 1   icosapent Ethyl (VASCEPA) 1 g capsule, Take 2 capsules (2 g total) by mouth 2 (two) times daily., Disp: 120 capsule, Rfl: 6   insulin glargine-yfgn (SEMGLEE, YFGN,) 100 UNIT/ML Pen, Inject 63 Units into the skin in the morning AND 20 Units every evening. Increase by 2 units every fourth day until blood sugars are at goal.  Maximum daily dose of 90 units., Disp: 30 mL, Rfl: 6   insulin lispro (HUMALOG KWIKPEN) 100 UNIT/ML KwikPen, Inject 0-12 Units into the skin 3 (three) times daily., Disp: 15 mL, Rfl: 11   Insulin Pen Needle (TRUEPLUS 5-BEVEL PEN NEEDLES) 31G X 5 MM MISC, Use 5  times daily as needed with Insulin Glargine and Humalog., Disp: 100 each, Rfl: 6   Insulin Syringe-Needle U-100 31G X 5/16" 1 ML MISC, 1 each by Does not apply route 4 (four) times daily., Disp: 120 each, Rfl: 12   ipratropium-albuterol (DUONEB) 0.5-2.5 (3) MG/3ML SOLN, TAKE 3 MLS BY NEBULIZATION EVERY 6 (SIX) HOURS AS NEEDED., Disp: 90 mL, Rfl: 3   loratadine (CLARITIN) 10 MG tablet, Take 1 tablet (10 mg total) by mouth daily., Disp: 30 tablet, Rfl: 3   magnesium oxide (MAG-OX) 400 MG tablet, Take 1 tablet (400 mg total) by mouth in the morning and at bedtime., Disp: 60 tablet, Rfl: 1    metoCLOPramide (REGLAN) 10 MG tablet, Take 1 tablet (10 mg total) by mouth every 8 (eight) hours as needed for nausea., Disp: 90 tablet, Rfl: 1   mometasone-formoterol (DULERA) 100-5 MCG/ACT AERO, INHALE 2 PUFFS INTO THE LUNGS 2 (TWO) TIMES DAILY., Disp: 13 g, Rfl: 6   Multiple Vitamin (MULTIVITAMIN WITH MINERALS) TABS tablet, Take 1 tablet by mouth daily., Disp: , Rfl:    omeprazole (PRILOSEC) 20 MG capsule, Take 1 capsule (20 mg total) by mouth daily., Disp: 90 capsule, Rfl: 1   potassium chloride SA (KLOR-CON M) 20 MEQ tablet, TAKE 1.5 TABLETS (30 MEQ TOTAL) BY MOUTH ONCE DAILY., Disp: 135 tablet, Rfl: 0   RELION LANCETS MICRO-THIN 33G MISC, 1 each by Does not apply route at bedtime., Disp: 30 each, Rfl: 5   spironolactone (ALDACTONE) 25 MG tablet, Take 1 tablet (25 mg total) by mouth daily., Disp: 90 tablet, Rfl: 1   Syringe/Needle, Disp, (SYRINGE 3CC/21GX1-1/4") 21G X 1-1/4" 3 ML MISC, Inject 1 application as directed daily., Disp: 30 each, Rfl: 0   Medications ordered in this encounter:  No orders of the defined types were placed in this encounter.    *If you need refills on other medications prior to your next appointment, please contact your pharmacy*  Follow-Up: Call back or seek an in-person evaluation if the symptoms worsen or if the condition fails to improve as anticipated.  [facility] [phone]  Other Instructions Bacterial Conjunctivitis, Adult Bacterial conjunctivitis is an infection of your conjunctiva. This is the clear membrane that covers the white part of your eye and the inner part of your eyelid. This infection can make your eye: Red or pink. Itchy or irritated. This condition spreads easily from person to person (is contagious) and from one eye to the other eye. What are the causes? This condition is caused by germs (bacteria). You may get the infection if you come into close contact with: A person who has the infection. Items that have germs on  them (are contaminated), such as face towels, contact lens solution, or eye makeup. What increases the risk? You are more likely to get this condition if: You have contact with people who have the infection. You wear contact lenses. You have a sinus infection. You have had a recent eye injury or surgery. You have a weak body defense system (immune system). You have dry eyes. What are the signs or symptoms?  Thick, yellowish discharge from the eye. Tearing or watery eyes. Itchy eyes. Burning feeling in your eyes. Eye redness. Swollen eyelids. Blurred vision. How is this treated?  Antibiotic eye drops or ointment. Antibiotic medicine taken by mouth. This is used for infections that do not get better with drops or ointment or that last more than 10 days. Cool, wet cloths placed on the eyes. Artificial  tears used 2-6 times a day. Follow these instructions at home: Medicines Take or apply your antibiotic medicine as told by your doctor. Do not stop using it even if you start to feel better. Take or apply over-the-counter and prescription medicines only as told by your doctor. Do not touch your eyelid with the eye-drop bottle or the ointment tube. Managing discomfort Wipe any fluid from your eye with a warm, wet washcloth or a cotton ball. Place a clean, cool, wet cloth on your eye. Do this for 10-20 minutes, 3-4 times a day. General instructions Do not wear contacts until the infection is gone. Wear glasses until your doctor says it is okay to wear contacts again. Do not wear eye makeup until the infection is gone. Throw away old eye makeup. Change or wash your pillowcase every day. Do not share towels or washcloths. Wash your hands often with soap and water for at least 20 seconds and especially before touching your face or eyes. Use paper towels to dry your hands. Do not touch or rub your eyes. Do not drive or use heavy machinery if your vision is blurred. Contact a doctor  if: You have a fever. You do not get better after 10 days. Get help right away if: You have a fever and your symptoms get worse all of a sudden. You have very bad pain when you move your eye. Your face: Hurts. Is red. Is swollen. You have sudden loss of vision. Summary Bacterial conjunctivitis is an infection of your conjunctiva. This infection spreads easily from person to person. Wash your hands often with soap and water for at least 20 seconds and especially before touching your face or eyes. Use paper towels to dry your hands. Take or apply your antibiotic medicine as told by your doctor. Contact a doctor if you have a fever or you do not get better after 10 days. This information is not intended to replace advice given to you by your health care provider. Make sure you discuss any questions you have with your health care provider. Document Revised: 05/27/2020 Document Reviewed: 05/27/2020 Elsevier Patient Education  2024 Elsevier Inc.    If you have been instructed to have an in-person evaluation today at a local Urgent Care facility, please use the link below. It will take you to a list of all of our available [facility], including address, phone number and hours of operation. Please do not delay care.  [facility]  If you or a family member do not have a primary care provider, use the link below to schedule a visit and establish care. When you choose a [facility] primary care physician or advanced practice provider, you gain a long-term partner in health. Find a Primary Care Provider  Learn more about [facility] in-office and virtual care options:  - Get Care Now

## 2023-01-13 DIAGNOSIS — M25511 Pain in right shoulder: Secondary | ICD-10-CM | POA: Diagnosis not present

## 2023-01-13 DIAGNOSIS — I10 Essential (primary) hypertension: Secondary | ICD-10-CM | POA: Diagnosis not present

## 2023-01-13 DIAGNOSIS — E785 Hyperlipidemia, unspecified: Secondary | ICD-10-CM | POA: Diagnosis not present

## 2023-01-13 DIAGNOSIS — E119 Type 2 diabetes mellitus without complications: Secondary | ICD-10-CM | POA: Diagnosis not present

## 2023-01-17 ENCOUNTER — Other Ambulatory Visit: Payer: Self-pay

## 2023-01-18 ENCOUNTER — Other Ambulatory Visit: Payer: Self-pay

## 2023-01-23 ENCOUNTER — Other Ambulatory Visit: Payer: Self-pay

## 2023-01-23 ENCOUNTER — Other Ambulatory Visit: Payer: Self-pay | Admitting: Family Medicine

## 2023-01-23 DIAGNOSIS — E876 Hypokalemia: Secondary | ICD-10-CM

## 2023-01-24 ENCOUNTER — Other Ambulatory Visit: Payer: Self-pay

## 2023-01-24 MED ORDER — POTASSIUM CHLORIDE CRYS ER 20 MEQ PO TBCR
30.0000 meq | EXTENDED_RELEASE_TABLET | Freq: Every day | ORAL | 0 refills | Status: DC
  Filled 2023-01-24 – 2023-02-26 (×2): qty 135, 90d supply, fill #0

## 2023-01-24 NOTE — Telephone Encounter (Signed)
Requested by interface surescripts. Future visit in 2 months  Requested Prescriptions  Pending Prescriptions Disp Refills   potassium chloride SA (KLOR-CON M) 20 MEQ tablet 135 tablet 0    Sig: TAKE 1.5 TABLETS (30 MEQ TOTAL) BY MOUTH ONCE DAILY.     Endocrinology:  Minerals - Potassium Supplementation Failed - 01/23/2023  9:57 AM      Failed - Cr in normal range and within 360 days    Creat  Date Value Ref Range Status  04/26/2016 1.00 0.50 - 1.05 mg/dL Final    Comment:      For patients > or = 61 years of age: The upper reference limit for Creatinine is approximately 13% higher for people identified as African-American.      Creatinine, Ser  Date Value Ref Range Status  09/08/2022 1.15 (H) 0.57 - 1.00 mg/dL Final   Creatinine, Urine  Date Value Ref Range Status  12/23/2015 198 20 - 320 mg/dL Final         Passed - K in normal range and within 360 days    Potassium  Date Value Ref Range Status  09/08/2022 4.6 3.5 - 5.2 mmol/L Final         Passed - Valid encounter within last 12 months    Recent Outpatient Visits           1 week ago Type 2 diabetes mellitus with diabetic polyneuropathy, with long-term current use of insulin (HCC)   [facility] Dept Of [place] Hoy Register, MD   4 months ago Type 2 diabetes mellitus with diabetic polyneuropathy, with long-term current use of insulin (HCC)   [facility] - A Dept Of [place]. [facility] Hoy Register, MD   7 months ago Type 2 diabetes mellitus with diabetic polyneuropathy, with long-term current use of insulin (HCC)   [facility] - A Dept Of [place]. [facility] Hoy Register, MD   10 months ago Type 2 diabetes mellitus with diabetic polyneuropathy, with long-term current use of insulin (HCC)   [facility] - A Dept Of [place]. [facility] Hoy Register, MD   1 year ago  Type 2 diabetes mellitus with diabetic polyneuropathy, with long-term current use of insulin (HCC)   [facility] - A Dept Of [place]. [facility] Hoy Register, MD       Future Appointments             In 2 months Hoy Register, MD [facility] - A Dept Of [place]. [facility]

## 2023-01-25 ENCOUNTER — Other Ambulatory Visit: Payer: Self-pay

## 2023-02-27 ENCOUNTER — Other Ambulatory Visit: Payer: Self-pay

## 2023-03-02 ENCOUNTER — Other Ambulatory Visit: Payer: Self-pay | Admitting: Family Medicine

## 2023-03-02 ENCOUNTER — Other Ambulatory Visit: Payer: Self-pay

## 2023-03-03 ENCOUNTER — Other Ambulatory Visit: Payer: Self-pay

## 2023-03-03 MED ORDER — TRUEPLUS 5-BEVEL PEN NEEDLES 31G X 5 MM MISC
6 refills | Status: DC
  Filled 2023-03-03: qty 100, 20d supply, fill #0
  Filled 2023-05-02: qty 100, 20d supply, fill #1
  Filled 2023-06-21: qty 100, 20d supply, fill #2
  Filled 2023-07-26: qty 100, 20d supply, fill #3
  Filled 2023-09-04: qty 100, 20d supply, fill #4
  Filled 2023-10-16 – 2023-11-01 (×3): qty 100, 20d supply, fill #5
  Filled 2024-01-17: qty 100, 20d supply, fill #6

## 2023-03-08 ENCOUNTER — Other Ambulatory Visit: Payer: Self-pay

## 2023-03-20 ENCOUNTER — Telehealth: Payer: Self-pay

## 2023-03-20 NOTE — Telephone Encounter (Signed)
Patient contacted to schedule appointment for Follow-up A1C,.  Appointment for 05/08/2023.  patient to bring BS monitor and or log to appointment.

## 2023-03-22 ENCOUNTER — Other Ambulatory Visit: Payer: Self-pay

## 2023-04-09 ENCOUNTER — Other Ambulatory Visit: Payer: Self-pay

## 2023-04-10 ENCOUNTER — Other Ambulatory Visit: Payer: Self-pay

## 2023-04-12 ENCOUNTER — Other Ambulatory Visit: Payer: Self-pay | Admitting: Family Medicine

## 2023-04-12 ENCOUNTER — Other Ambulatory Visit: Payer: Self-pay

## 2023-04-12 DIAGNOSIS — E876 Hypokalemia: Secondary | ICD-10-CM

## 2023-04-12 MED ORDER — POTASSIUM CHLORIDE CRYS ER 20 MEQ PO TBCR
30.0000 meq | EXTENDED_RELEASE_TABLET | Freq: Every day | ORAL | 0 refills | Status: DC
  Filled 2023-04-12 – 2023-06-01 (×2): qty 135, 90d supply, fill #0

## 2023-04-12 NOTE — Telephone Encounter (Signed)
Requested Prescriptions  Pending Prescriptions Disp Refills   potassium chloride SA (KLOR-CON M) 20 MEQ tablet 135 tablet 0    Sig: Take 1.5 tablets (30 mEq total) by mouth daily.     Endocrinology:  Minerals - Potassium Supplementation Failed - 04/12/2023 11:56 AM      Failed - Cr in normal range and within 360 days    Creat  Date Value Ref Range Status  04/26/2016 1.00 0.50 - 1.05 mg/dL Final    Comment:      For patients > or = 62 years of age: The upper reference limit for Creatinine is approximately 13% higher for people identified as African-American.      Creatinine, Ser  Date Value Ref Range Status  09/08/2022 1.15 (H) 0.57 - 1.00 mg/dL Final   Creatinine, Urine  Date Value Ref Range Status  12/23/2015 198 20 - 320 mg/dL Final         Passed - K in normal range and within 360 days    Potassium  Date Value Ref Range Status  09/08/2022 4.6 3.5 - 5.2 mmol/L Final         Passed - Valid encounter within last 12 months    Recent Outpatient Visits           3 months ago Type 2 diabetes mellitus with diabetic polyneuropathy, with long-term current use of insulin (HCC)   [place] [facility] Dept Of [place]. [facility] Hoy Register, MD   7 months ago Type 2 diabetes mellitus with diabetic polyneuropathy, with long-term current use of insulin (HCC)   [place] [facility] [place] - [facility] Dept Of [place]. [facility] Hoy Register, MD   10 months ago Type 2 diabetes mellitus with diabetic polyneuropathy, with long-term current use of insulin (HCC)   [place] [facility] - [facility] Dept Of [place]. [facility] Hoy Register, MD   1 year ago Type 2 diabetes mellitus with diabetic polyneuropathy, with long-term current use of insulin (HCC)   [place] [facility] [place] - A Dept Of [place]. [facility] Hoy Register, MD   1 year ago Type 2 diabetes mellitus with diabetic polyneuropathy, with long-term  current use of insulin (HCC)   [place] [facility] [place] - A Dept Of [place]. [facility] Hoy Register, MD       Future Appointments             In 3 weeks Lois Huxley, Cornelius Moras, RPH-CPP  [facility] [place] - A Dept Of [place]. [facility]   In 1 month Hoy Register, MD [facility] [place] - A Dept Of [place]. [facility]

## 2023-04-13 ENCOUNTER — Other Ambulatory Visit: Payer: Self-pay

## 2023-04-13 ENCOUNTER — Ambulatory Visit: Payer: BC Managed Care – PPO | Admitting: Family Medicine

## 2023-05-02 ENCOUNTER — Other Ambulatory Visit: Payer: Self-pay

## 2023-05-02 ENCOUNTER — Telehealth: Payer: Self-pay

## 2023-05-02 NOTE — Telephone Encounter (Signed)
 Copied from CRM [id number]. Topic: Clinical - Medical Advice >> May 02, 2023  1:21 PM Dennison Nancy wrote: Reason for CRM: patient ask if Dr. Alvis Lemmings nurse can call her having issue with her stomach , when eat vomiting or food is not digesting stomach

## 2023-05-02 NOTE — Telephone Encounter (Signed)
 Patient schedule for 05/11/2022

## 2023-05-05 ENCOUNTER — Other Ambulatory Visit: Payer: Self-pay

## 2023-05-08 ENCOUNTER — Ambulatory Visit: Payer: BC Managed Care – PPO | Admitting: Pharmacist

## 2023-05-11 ENCOUNTER — Ambulatory Visit: Admitting: Physician Assistant

## 2023-05-11 ENCOUNTER — Other Ambulatory Visit: Payer: Self-pay

## 2023-05-11 ENCOUNTER — Other Ambulatory Visit: Payer: Self-pay | Admitting: Family Medicine

## 2023-05-11 DIAGNOSIS — E1159 Type 2 diabetes mellitus with other circulatory complications: Secondary | ICD-10-CM

## 2023-05-12 ENCOUNTER — Other Ambulatory Visit: Payer: Self-pay

## 2023-05-12 MED ORDER — BENAZEPRIL HCL 40 MG PO TABS
40.0000 mg | ORAL_TABLET | Freq: Every day | ORAL | 0 refills | Status: DC
Start: 2023-05-12 — End: 2023-08-22
  Filled 2023-05-12 – 2023-05-16 (×2): qty 90, 90d supply, fill #0

## 2023-05-12 NOTE — Telephone Encounter (Signed)
 Requested Prescriptions  Pending Prescriptions Disp Refills   benazepril (LOTENSIN) 40 MG tablet 90 tablet 0    Sig: Take 1 tablet (40 mg total) by mouth daily.     Cardiovascular:  ACE Inhibitors Failed - 05/12/2023 11:46 AM      Failed - Cr in normal range and within 180 days    Creat  Date Value Ref Range Status  04/26/2016 1.00 0.50 - 1.05 mg/dL Final    Comment:      For patients > or = 62 years of age: The upper reference limit for Creatinine is approximately 13% higher for people identified as African-American.      Creatinine, Ser  Date Value Ref Range Status  09/08/2022 1.15 (H) 0.57 - 1.00 mg/dL Final   Creatinine, Urine  Date Value Ref Range Status  12/23/2015 198 20 - 320 mg/dL Final         Failed - K in normal range and within 180 days    Potassium  Date Value Ref Range Status  09/08/2022 4.6 3.5 - 5.2 mmol/L Final         Passed - Patient is not pregnant      Passed - Last BP in normal range    BP Readings from Last 1 Encounters:  01/11/23 131/79         Passed - Valid encounter within last 6 months    Recent Outpatient Visits           4 months ago Type 2 diabetes mellitus with diabetic polyneuropathy, with long-term current use of insulin (HCC)   [place] [facility] Dept Of [place]. [facility] Hoy Register, MD   8 months ago Type 2 diabetes mellitus with diabetic polyneuropathy, with long-term current use of insulin (HCC)   [place] [facility] [place] - A Dept Of [place]. [facility] Hoy Register, MD   11 months ago Type 2 diabetes mellitus with diabetic polyneuropathy, with long-term current use of insulin (HCC)   [place] [facility] [place] - A Dept Of [place]. [facility] Hoy Register, MD   1 year ago Type 2 diabetes mellitus with diabetic polyneuropathy, with long-term current use of insulin (HCC)   [place] [facility] [place] - A Dept Of [place]. [facility]  Hoy Register, MD   1 year ago Type 2 diabetes mellitus with diabetic polyneuropathy, with long-term current use of insulin (HCC)   [place] [facility] [place] - A Dept Of [place]. [facility] Hoy Register, MD       Future Appointments             In 2 weeks Hoy Register, MD [facility] - A Dept Of [place]. [facility]   In 2 weeks Lois Huxley, Cornelius Moras, RPH-CPP [place] [facility] [place] - A Dept Of . [facility]

## 2023-05-16 ENCOUNTER — Telehealth: Payer: Self-pay

## 2023-05-16 ENCOUNTER — Other Ambulatory Visit: Payer: Self-pay

## 2023-05-16 DIAGNOSIS — G4733 Obstructive sleep apnea (adult) (pediatric): Secondary | ICD-10-CM | POA: Diagnosis not present

## 2023-05-16 NOTE — Telephone Encounter (Unsigned)
 Copied from CRM [id number]. Topic: Clinical - Medication Question >> May 16, 2023 11:42 AM Gery Pray wrote: Reason for CRM: Patient would like to have her medications faxed over to [facility]. Patient would also like her last Wellness visit to be faxed over to them. Fax: [phone] Mikeal Hawthorne. Please contact at [phone] for additional information.

## 2023-05-17 ENCOUNTER — Other Ambulatory Visit: Payer: Self-pay

## 2023-05-17 ENCOUNTER — Other Ambulatory Visit: Payer: Self-pay | Admitting: Family Medicine

## 2023-05-17 DIAGNOSIS — E1159 Type 2 diabetes mellitus with other circulatory complications: Secondary | ICD-10-CM

## 2023-05-17 MED ORDER — SPIRONOLACTONE 25 MG PO TABS
25.0000 mg | ORAL_TABLET | Freq: Every day | ORAL | 1 refills | Status: DC
Start: 2023-05-17 — End: 2023-09-05
  Filled 2023-05-17 – 2023-05-29 (×3): qty 90, 90d supply, fill #0
  Filled 2023-08-22: qty 90, 90d supply, fill #1

## 2023-05-17 NOTE — Telephone Encounter (Signed)
 Spoke with patient. patient agreed to sign release of information once form is signed her medications list, last Wellness visit   will  to [facility] at Fax: [phone] Mikeal Hawthorne.  Patient  Will stop by the office to sign today.

## 2023-05-18 DIAGNOSIS — Z48816 Encounter for surgical aftercare following surgery on the genitourinary system: Secondary | ICD-10-CM | POA: Diagnosis not present

## 2023-05-18 NOTE — Telephone Encounter (Signed)
 Noted.

## 2023-05-18 NOTE — Telephone Encounter (Addendum)
 The patient came in on May 17, 2023 and signed the release form. The fax was sent over to [facility] at Fax: [phone] ATTN: Reita May, Along with the list of medications and the last wellness visit as requested.

## 2023-05-29 ENCOUNTER — Other Ambulatory Visit: Payer: Self-pay

## 2023-05-30 ENCOUNTER — Other Ambulatory Visit: Payer: Self-pay

## 2023-05-30 ENCOUNTER — Ambulatory Visit: Attending: Family Medicine | Admitting: Family Medicine

## 2023-05-30 ENCOUNTER — Encounter: Payer: Self-pay | Admitting: Family Medicine

## 2023-05-30 ENCOUNTER — Ambulatory Visit: Payer: BC Managed Care – PPO | Admitting: Family Medicine

## 2023-05-30 VITALS — BP 111/75 | HR 80 | Temp 98.7°F | Ht 65.0 in | Wt 187.0 lb

## 2023-05-30 DIAGNOSIS — K3184 Gastroparesis: Secondary | ICD-10-CM

## 2023-05-30 DIAGNOSIS — G4733 Obstructive sleep apnea (adult) (pediatric): Secondary | ICD-10-CM | POA: Diagnosis not present

## 2023-05-30 DIAGNOSIS — E1142 Type 2 diabetes mellitus with diabetic polyneuropathy: Secondary | ICD-10-CM

## 2023-05-30 DIAGNOSIS — Z794 Long term (current) use of insulin: Secondary | ICD-10-CM

## 2023-05-30 DIAGNOSIS — E1143 Type 2 diabetes mellitus with diabetic autonomic (poly)neuropathy: Secondary | ICD-10-CM

## 2023-05-30 DIAGNOSIS — E1169 Type 2 diabetes mellitus with other specified complication: Secondary | ICD-10-CM | POA: Diagnosis not present

## 2023-05-30 DIAGNOSIS — E785 Hyperlipidemia, unspecified: Secondary | ICD-10-CM

## 2023-05-30 HISTORY — DX: Obstructive sleep apnea (adult) (pediatric): G47.33

## 2023-05-30 LAB — GLUCOSE, POCT (MANUAL RESULT ENTRY): POC Glucose: 227 mg/dL — AB (ref 70–99)

## 2023-05-30 LAB — POCT GLYCOSYLATED HEMOGLOBIN (HGB A1C): HbA1c, POC (controlled diabetic range): 8.6 % — AB (ref 0.0–7.0)

## 2023-05-30 MED ORDER — INSULIN GLARGINE-YFGN 100 UNIT/ML ~~LOC~~ SOPN
PEN_INJECTOR | SUBCUTANEOUS | 6 refills | Status: DC
Start: 2023-05-30 — End: 2023-09-05
  Filled 2023-05-30 – 2023-06-21 (×2): qty 30, 34d supply, fill #0
  Filled 2023-07-26: qty 30, 34d supply, fill #1
  Filled 2023-09-04: qty 30, 34d supply, fill #2

## 2023-05-30 NOTE — Patient Instructions (Signed)
 VISIT SUMMARY:  During today's visit, we discussed your concerns about the cost of insulin, your recent diagnosis of severe sleep apnea, your efforts to quit smoking, and your ongoing issues with gastroparesis. We reviewed your diabetes management plan and made some adjustments to your insulin doses. We also talked about the upcoming delivery of your CPAP machine and your progress in reducing cigarette consumption. Lastly, we addressed your gastroparesis symptoms and medication management.  YOUR PLAN:  -TYPE 2 DIABETES MELLITUS: Type 2 diabetes is a condition where your body does not use insulin properly, leading to high blood sugar levels. Your A1c is slightly improved but still above target. We have increased your morning insulin glargine to 65 units and your evening dose to 25 units. Please remember to use your Novolog pen with meals, especially at work. Continue with your dietary modifications and efforts to quit smoking to help manage your diabetes.  -SEVERE OBSTRUCTIVE SLEEP APNEA: Severe obstructive sleep apnea is a condition where your breathing stops and starts during sleep, leading to poor sleep quality and daytime fatigue. You are awaiting the delivery of your CPAP machine from the [place], which should help improve your sleep and reduce fatigue.  -GASTROPARESIS: Gastroparesis is a condition that affects the stomach muscles and prevents proper stomach emptying, causing nausea and discomfort. Continue using metoclopramide as needed for nausea and monitor your symptoms. We will adjust your treatment if necessary.  -MEDICATION MANAGEMENT: We discussed the issues with your insulin prescription and the temporary out-of-pocket expense. You are considering transferring your prescriptions to the [place] for cost-free medication delivery. The issue with the insulin coupon has been resolved, allowing you to access reduced-cost medication. We will [facility] an [facility] and ensure  your prescriptions are sent to the pharmacy with the updated insulin doses.  -GENERAL HEALTH MAINTENANCE: You are making positive lifestyle changes, including quitting smoking and improving your diet, to enhance your overall health. Continue your efforts to quit smoking and engage in regular physical activity as the weather permits.  INSTRUCTIONS:  We need to perform blood work to assess your cholesterol levels. Please schedule a follow-up appointment in three months.
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
## 2023-05-31 ENCOUNTER — Encounter: Payer: Self-pay | Admitting: Family Medicine

## 2023-05-31 LAB — CMP14+EGFR
ALT: 25 IU/L (ref 0–32)
AST: 27 IU/L (ref 0–40)
Albumin: 4.4 g/dL (ref 3.9–4.9)
Alkaline Phosphatase: 99 IU/L (ref 44–121)
BUN/Creatinine Ratio: 16 (ref 12–28)
BUN: 21 mg/dL (ref 8–27)
Bilirubin Total: 0.4 mg/dL (ref 0.0–1.2)
CO2: 26 mmol/L (ref 20–29)
Calcium: 9.9 mg/dL (ref 8.7–10.3)
Chloride: 98 mmol/L (ref 96–106)
Creatinine, Ser: 1.28 mg/dL — ABNORMAL HIGH (ref 0.57–1.00)
Globulin, Total: 2.5 g/dL (ref 1.5–4.5)
Glucose: 206 mg/dL — ABNORMAL HIGH (ref 70–99)
Potassium: 4.8 mmol/L (ref 3.5–5.2)
Sodium: 139 mmol/L (ref 134–144)
Total Protein: 6.9 g/dL (ref 6.0–8.5)
eGFR: 48 mL/min/{1.73_m2} — ABNORMAL LOW (ref >59)

## 2023-05-31 LAB — LP+NON-HDL CHOLESTEROL
Cholesterol, Total: 152 mg/dL (ref 100–199)
HDL: 35 mg/dL — ABNORMAL LOW (ref >39)
LDL Chol Calc (NIH): 58 mg/dL (ref 0–99)
Total Non-HDL-Chol (LDL+VLDL): 117 mg/dL (ref 0–129)
Triglycerides: 385 mg/dL — ABNORMAL HIGH (ref 0–149)
VLDL Cholesterol Cal: 59 mg/dL — ABNORMAL HIGH (ref 5–40)

## 2023-06-01 ENCOUNTER — Other Ambulatory Visit: Payer: Self-pay

## 2023-06-01 ENCOUNTER — Ambulatory Visit: Admitting: Pharmacist

## 2023-06-02 ENCOUNTER — Other Ambulatory Visit: Payer: Self-pay

## 2023-06-22 ENCOUNTER — Other Ambulatory Visit: Payer: Self-pay

## 2023-06-23 ENCOUNTER — Other Ambulatory Visit: Payer: Self-pay

## 2023-06-26 DIAGNOSIS — G4733 Obstructive sleep apnea (adult) (pediatric): Secondary | ICD-10-CM | POA: Diagnosis not present

## 2023-07-17 DIAGNOSIS — E785 Hyperlipidemia, unspecified: Secondary | ICD-10-CM | POA: Diagnosis not present

## 2023-07-17 DIAGNOSIS — I10 Essential (primary) hypertension: Secondary | ICD-10-CM | POA: Diagnosis not present

## 2023-07-17 DIAGNOSIS — E119 Type 2 diabetes mellitus without complications: Secondary | ICD-10-CM | POA: Diagnosis not present

## 2023-07-17 DIAGNOSIS — M545 Low back pain, unspecified: Secondary | ICD-10-CM | POA: Diagnosis not present

## 2023-07-26 ENCOUNTER — Other Ambulatory Visit: Payer: Self-pay

## 2023-07-27 ENCOUNTER — Other Ambulatory Visit: Payer: Self-pay

## 2023-08-08 ENCOUNTER — Other Ambulatory Visit: Payer: Self-pay

## 2023-08-08 ENCOUNTER — Encounter: Payer: Self-pay | Admitting: Pharmacist

## 2023-08-08 ENCOUNTER — Other Ambulatory Visit (HOSPITAL_BASED_OUTPATIENT_CLINIC_OR_DEPARTMENT_OTHER): Admitting: Pharmacist

## 2023-08-08 DIAGNOSIS — Z7984 Long term (current) use of oral hypoglycemic drugs: Secondary | ICD-10-CM

## 2023-08-08 DIAGNOSIS — E119 Type 2 diabetes mellitus without complications: Secondary | ICD-10-CM

## 2023-08-08 DIAGNOSIS — Z794 Long term (current) use of insulin: Secondary | ICD-10-CM

## 2023-08-08 MED ORDER — INSULIN LISPRO (1 UNIT DIAL) 100 UNIT/ML (KWIKPEN)
0.0000 [IU] | PEN_INJECTOR | Freq: Three times a day (TID) | SUBCUTANEOUS | 11 refills | Status: AC
  Filled 2023-08-08 – 2023-09-07 (×3): qty 15, 42d supply, fill #0

## 2023-08-08 NOTE — Progress Notes (Signed)
 Pharmacy TNM Diabetes Measure Review  S:  Patient was identified in a report as being at risk for failing the True [place] Metric of A1c control (<8%). Last A1c was 8.6% in April of this year. Last PCP visit was 05/30/2023  Call placed to patient to discuss diabetes control and medication management. Patient has not been seen by the clinical pharmacist before. She's in good spirits today. She admits that her blood sugar control has not been ideal d/t medication non-adherence and dietary indiscretion. However, she is amenable to refilling medications and to doing what she can to improve control before she sees Dr. Newlin next month.   Current diabetes medications include: glimepiride  (requesting refills), Semglee , Humalog  (requesting refills)  Patient reports adherence to taking all medications as prescribed.   Insurance coverage: BCBS  Patient denies hypoglycemic events.  O:   Lab Results  Component Value Date   HGBA1C 8.6 (A) 05/30/2023   There were no vitals filed for this visit.  Lipid Panel     Component Value Date/Time   CHOL 152 05/30/2023 1012   TRIG 385 (H) 05/30/2023 1012   HDL 35 (L) 05/30/2023 1012   CHOLHDL 5.3 (H) 09/03/2020 0853   CHOLHDL 4.6 12/23/2015 0904   VLDL 46 (H) 12/23/2015 0904   LDLCALC 58 05/30/2023 1012    Clinical Atherosclerotic Cardiovascular Disease (ASCVD): No  The 10-year ASCVD risk score (Arnett DK, et al., 2019) is: 20.9%   Values used to calculate the score:     Age: 63 years     Sex: Female     Is Non-Hispanic African American: Yes     Diabetic: Yes     Tobacco smoker: Yes     Systolic Blood Pressure: 111 mmHg     Is BP treated: Yes     HDL Cholesterol: 35 mg/dL     Total Cholesterol: 152 mg/dL   Patient is participating in a Managed Medicaid Plan:  No   A/P: Diabetes longstanding currently uncontrolled based on her A1c. Collaborated with her pharmacy and we refill glimepiride , Humalog , and carvedilol  for her today. Patient is not  currently hypoglycemic but is able to verbalize appropriate hypoglycemia management plan. She seems willing to improve her diet and medication adherence. I instructed her to let Dr. Adan Holms know if her A1c is above goal next month that she'd be interested in seeing me for DM management. -Continued current regimen. Refills given.  -Extensively discussed pathophysiology of diabetes, recommended lifestyle interventions, dietary effects on blood sugar control.  -Counseled on s/sx of and management of hypoglycemia.  -Next A1c anticipated 08/2023.   Follow-up:   Pharmacist prn PCP clinic visit: 09/05/2023  Marene Shape, PharmD, BCACP, CPP Clinical Pharmacist [facility] [phone]

## 2023-08-10 ENCOUNTER — Other Ambulatory Visit: Payer: Self-pay

## 2023-08-14 ENCOUNTER — Other Ambulatory Visit: Payer: Self-pay

## 2023-08-22 ENCOUNTER — Other Ambulatory Visit: Payer: Self-pay

## 2023-08-22 ENCOUNTER — Other Ambulatory Visit: Payer: Self-pay | Admitting: Family Medicine

## 2023-08-22 DIAGNOSIS — E876 Hypokalemia: Secondary | ICD-10-CM

## 2023-08-22 DIAGNOSIS — I152 Hypertension secondary to endocrine disorders: Secondary | ICD-10-CM

## 2023-08-22 MED ORDER — POTASSIUM CHLORIDE CRYS ER 20 MEQ PO TBCR
30.0000 meq | EXTENDED_RELEASE_TABLET | Freq: Every day | ORAL | 1 refills | Status: DC
  Filled 2023-08-22: qty 135, 90d supply, fill #0
  Filled 2023-11-13: qty 135, 90d supply, fill #1

## 2023-08-22 MED ORDER — BENAZEPRIL HCL 40 MG PO TABS
40.0000 mg | ORAL_TABLET | Freq: Every day | ORAL | 1 refills | Status: DC
  Filled 2023-08-22: qty 90, 90d supply, fill #0
  Filled 2023-11-13 (×2): qty 90, 90d supply, fill #1

## 2023-08-24 ENCOUNTER — Other Ambulatory Visit: Payer: Self-pay

## 2023-09-04 ENCOUNTER — Other Ambulatory Visit: Payer: Self-pay

## 2023-09-04 ENCOUNTER — Telehealth: Payer: Self-pay | Admitting: Family Medicine

## 2023-09-04 NOTE — Telephone Encounter (Signed)
Contacted pt confirmed appt

## 2023-09-05 ENCOUNTER — Ambulatory Visit: Attending: Family Medicine | Admitting: Family Medicine

## 2023-09-05 ENCOUNTER — Other Ambulatory Visit: Payer: Self-pay

## 2023-09-05 ENCOUNTER — Encounter: Payer: Self-pay | Admitting: Family Medicine

## 2023-09-05 VITALS — BP 118/79 | HR 70 | Wt 187.2 lb

## 2023-09-05 DIAGNOSIS — G4733 Obstructive sleep apnea (adult) (pediatric): Secondary | ICD-10-CM

## 2023-09-05 DIAGNOSIS — E1142 Type 2 diabetes mellitus with diabetic polyneuropathy: Secondary | ICD-10-CM | POA: Diagnosis not present

## 2023-09-05 DIAGNOSIS — E1169 Type 2 diabetes mellitus with other specified complication: Secondary | ICD-10-CM | POA: Diagnosis not present

## 2023-09-05 DIAGNOSIS — Z23 Encounter for immunization: Secondary | ICD-10-CM | POA: Diagnosis not present

## 2023-09-05 DIAGNOSIS — E1159 Type 2 diabetes mellitus with other circulatory complications: Secondary | ICD-10-CM

## 2023-09-05 DIAGNOSIS — Z794 Long term (current) use of insulin: Secondary | ICD-10-CM | POA: Diagnosis not present

## 2023-09-05 DIAGNOSIS — K3184 Gastroparesis: Secondary | ICD-10-CM

## 2023-09-05 DIAGNOSIS — E785 Hyperlipidemia, unspecified: Secondary | ICD-10-CM

## 2023-09-05 DIAGNOSIS — E1143 Type 2 diabetes mellitus with diabetic autonomic (poly)neuropathy: Secondary | ICD-10-CM

## 2023-09-05 DIAGNOSIS — I152 Hypertension secondary to endocrine disorders: Secondary | ICD-10-CM

## 2023-09-05 LAB — POCT GLYCOSYLATED HEMOGLOBIN (HGB A1C): HbA1c, POC (controlled diabetic range): 9.1 % — AB (ref 0.0–7.0)

## 2023-09-05 LAB — GLUCOSE, POCT (MANUAL RESULT ENTRY): POC Glucose: 163 mg/dL — AB (ref 70–99)

## 2023-09-05 MED ORDER — GLIMEPIRIDE 4 MG PO TABS
4.0000 mg | ORAL_TABLET | Freq: Two times a day (BID) | ORAL | 1 refills | Status: AC
  Filled 2023-09-05 – 2023-11-09 (×2): qty 180, 90d supply, fill #0
  Filled 2024-02-14: qty 180, 90d supply, fill #1
  Filled ????-??-??: fill #1

## 2023-09-05 MED ORDER — HYDROCHLOROTHIAZIDE 25 MG PO TABS
25.0000 mg | ORAL_TABLET | Freq: Every day | ORAL | 1 refills | Status: AC
  Filled 2023-09-05 – 2023-10-10 (×2): qty 90, 90d supply, fill #0
  Filled 2024-01-17: qty 90, 90d supply, fill #1

## 2023-09-05 MED ORDER — INSULIN GLARGINE-YFGN 100 UNIT/ML ~~LOC~~ SOPN
PEN_INJECTOR | SUBCUTANEOUS | 6 refills | Status: DC
Start: 2023-09-05 — End: 2023-12-04
  Filled 2023-09-05 (×2): qty 30, 31d supply, fill #0
  Filled 2023-10-10: qty 27, 28d supply, fill #1
  Filled 2023-11-01 (×2): qty 27, 28d supply, fill #2
  Filled 2023-11-30: qty 27, 28d supply, fill #3

## 2023-09-05 MED ORDER — GABAPENTIN 300 MG PO CAPS
600.0000 mg | ORAL_CAPSULE | Freq: Two times a day (BID) | ORAL | 1 refills | Status: AC
  Filled 2024-01-17: qty 360, 90d supply, fill #0

## 2023-09-05 MED ORDER — ATORVASTATIN CALCIUM 40 MG PO TABS
40.0000 mg | ORAL_TABLET | Freq: Every day | ORAL | 1 refills | Status: AC
  Filled 2023-09-05 – 2023-10-10 (×2): qty 90, 90d supply, fill #0
  Filled 2024-01-17: qty 90, 90d supply, fill #1

## 2023-09-05 MED ORDER — SPIRONOLACTONE 25 MG PO TABS
25.0000 mg | ORAL_TABLET | Freq: Every day | ORAL | 1 refills | Status: AC
  Filled 2023-09-05 – 2023-11-13 (×2): qty 90, 90d supply, fill #0
  Filled 2024-02-19: qty 90, 90d supply, fill #1

## 2023-09-05 MED ORDER — METOCLOPRAMIDE HCL 10 MG PO TABS
10.0000 mg | ORAL_TABLET | Freq: Three times a day (TID) | ORAL | 1 refills | Status: AC | PRN
  Filled 2023-09-05: qty 90, 30d supply, fill #0
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
## 2023-09-05 NOTE — Patient Instructions (Addendum)
 VISIT SUMMARY:  Kelly Santana, during your visit, we discussed your elevated blood sugar levels and overall health. Your A1c has increased, and we need to improve your blood sugar control. We also reviewed your hyperlipidemia, hypertension, and other health conditions. You are making progress with smoking cessation and considering new exercise options.  YOUR PLAN:  -TYPE 2 DIABETES MELLITUS: Type 2 Diabetes Mellitus is a condition where your body does not use insulin  properly, leading to high blood sugar levels. We will increase your Semglee  dosage to 70 units in the morning and 28 units in the evening. Please try to check your blood sugar levels during the day on your non-working days. We will explore VA benefits for potential coverage of Humalog  or Novolog . Ensure you communicate with the VA if you adjust your insulin  regimen.  -HYPERLIPIDEMIA: Hyperlipidemia means you have high levels of fats (lipids) in your blood, which can increase your risk of heart disease. Your triglycerides are elevated, likely due to dietary habits. We recommend taking fish oil capsules to help manage your triglyceride levels.  -HYPERTENSION: Hypertension is high blood pressure. Your blood pressure is well-controlled with your current medications, so we will continue with the same regimen.  -GASTROPARESIS: Gastroparesis is a condition that affects the stomach muscles and prevents proper stomach emptying. You are managing this with Reglan  as needed for symptoms.  -OBSTRUCTIVE SLEEP APNEA: Severe Obstructive Sleep Apnea is a condition where your breathing stops and starts during sleep. You are managing this with a CPAP machine from the [facility].  -GENERAL HEALTH MAINTENANCE: You are working on quitting smoking and considering water aerobics for exercise. Keep up the efforts to quit smoking and try to participate in water aerobics for physical activity.  INSTRUCTIONS:  Please follow up in one month to reassess your blood sugar  control. Continue to monitor your blood sugar levels, especially on non-working days. Explore VA benefits for potential coverage of Humalog  or Novolog  and communicate with the VA if you adjust your insulin  regimen. Take fish oil capsules to manage your triglyceride levels. Keep up with your smoking cessation efforts and consider participating in water aerobics for exercise.

## 2023-09-06 ENCOUNTER — Other Ambulatory Visit: Payer: Self-pay

## 2023-09-06 ENCOUNTER — Encounter: Payer: Self-pay | Admitting: Family Medicine

## 2023-09-07 ENCOUNTER — Other Ambulatory Visit: Payer: Self-pay

## 2023-09-08 ENCOUNTER — Other Ambulatory Visit: Payer: Self-pay

## 2023-09-14 ENCOUNTER — Telehealth: Payer: Self-pay | Admitting: Family Medicine

## 2023-09-14 NOTE — Telephone Encounter (Signed)
 Called pt to confirm appt.Pt did not answer and could not LVM.

## 2023-09-26 ENCOUNTER — Other Ambulatory Visit (HOSPITAL_COMMUNITY): Payer: Self-pay

## 2023-09-27 ENCOUNTER — Other Ambulatory Visit: Payer: Self-pay

## 2023-09-27 ENCOUNTER — Encounter: Payer: Self-pay | Admitting: Family Medicine

## 2023-09-27 ENCOUNTER — Other Ambulatory Visit: Payer: Self-pay | Admitting: Family Medicine

## 2023-09-27 MED ORDER — FLUCONAZOLE 150 MG PO TABS
150.0000 mg | ORAL_TABLET | Freq: Once | ORAL | 0 refills | Status: AC
  Filled 2023-09-27: qty 1, 1d supply, fill #0

## 2023-09-28 ENCOUNTER — Other Ambulatory Visit: Payer: Self-pay

## 2023-09-29 ENCOUNTER — Other Ambulatory Visit: Payer: Self-pay

## 2023-10-02 ENCOUNTER — Other Ambulatory Visit: Payer: Self-pay

## 2023-10-10 ENCOUNTER — Other Ambulatory Visit: Payer: Self-pay

## 2023-10-16 ENCOUNTER — Other Ambulatory Visit: Payer: Self-pay

## 2023-10-16 ENCOUNTER — Ambulatory Visit: Admitting: Family Medicine

## 2023-11-01 ENCOUNTER — Other Ambulatory Visit: Payer: Self-pay

## 2023-11-02 ENCOUNTER — Other Ambulatory Visit: Payer: Self-pay

## 2023-11-10 ENCOUNTER — Other Ambulatory Visit: Payer: Self-pay

## 2023-11-13 ENCOUNTER — Other Ambulatory Visit (HOSPITAL_COMMUNITY): Payer: Self-pay

## 2023-11-13 ENCOUNTER — Other Ambulatory Visit: Payer: Self-pay

## 2023-11-27 ENCOUNTER — Encounter: Payer: Self-pay | Admitting: Family Medicine

## 2023-11-27 ENCOUNTER — Ambulatory Visit: Attending: Family Medicine | Admitting: Family Medicine

## 2023-11-27 VITALS — BP 145/84 | HR 74 | Ht 65.0 in | Wt 190.8 lb

## 2023-11-27 DIAGNOSIS — H9312 Tinnitus, left ear: Secondary | ICD-10-CM | POA: Diagnosis not present

## 2023-11-27 DIAGNOSIS — E1142 Type 2 diabetes mellitus with diabetic polyneuropathy: Secondary | ICD-10-CM

## 2023-11-27 DIAGNOSIS — Z794 Long term (current) use of insulin: Secondary | ICD-10-CM

## 2023-11-27 DIAGNOSIS — E1159 Type 2 diabetes mellitus with other circulatory complications: Secondary | ICD-10-CM

## 2023-11-27 DIAGNOSIS — I152 Hypertension secondary to endocrine disorders: Secondary | ICD-10-CM

## 2023-11-27 LAB — GLUCOSE, POCT (MANUAL RESULT ENTRY): POC Glucose: 61 mg/dL — AB (ref 70–99)

## 2023-11-27 NOTE — Patient Instructions (Addendum)
 Tinnitus Tinnitus is when you hear a sound that there's no actual source for. It may sound like ringing in your ears or something else. The sound may be loud, soft, or somewhere in between. It can last for a few seconds or be constant for days. It can come and go. Almost everyone has tinnitus at some point. It's not the same as hearing loss. But you may need to see a health care provider if: It lasts for a long time. It comes back often. You have trouble sleeping and focusing. What are the causes? The cause of tinnitus is often unknown. In some cases, you may get it if: You're around loud noises, such as from machines or music. An object gets stuck in your ear. Earwax builds up in Landscape architect. You drink a lot of alcohol or caffeine. You take certain medicines. You start to lose your hearing. You may also get it from some medical conditions. These may include: Ear or sinus infections. Heart diseases. High blood pressure. Allergies. Mnire's disease. Problems with your thyroid. A tumor. This is a growth of cells that isn't normal. A weak, bulging blood vessel called an aneurysm near your ear. What increases the risk? You may be more likely to get tinnitus if: You're around loud noises a lot. You're older. You drink alcohol. You smoke. What are the signs or symptoms? The main symptom is hearing a sound that there's no source for. It may sound like ringing. It may also sound like: Buzzing. Sizzling. Blowing air. Hissing. Whistling. Other sounds may include: Roaring. Running water. A musical note. Tapping. Humming. You may have symptoms in one ear or both ears. How is this diagnosed? Tinnitus is diagnosed based on your symptoms, your medical history, and an exam. Your provider may do a full hearing test if your tinnitus: Is in just one ear. Makes it hard for you to hear. Lasts 6 months or longer. You may also need to see an expert in hearing disorders called an audiologist.  They may ask you about your symptoms and how tinnitus affects your daily life. You may have other tests done. These may include: A CT scan. An MRI. An angiogram. This shows how blood flows through your blood vessels. How is this treated? Treatment may include: Therapy to help you manage the stress of living with tinnitus. Finding ways to mask or cover the sound of tinnitus. These include: Sound or white noise machines. Devices that fit in your ear and play sounds or music. A loud humidifier. Acoustic neural stimulation. This is when you use headphones to listen to music that has a special signal in it. Over time, this signal may change some of the pathways in your brain. This can make you less sensitive to tinnitus. This treatment is used for very severe cases. Using hearing aids or cochlear implants if your tinnitus is from hearing loss. If your tinnitus is caused by a medical condition, treating the condition may make it go away.  Follow these instructions at home: Managing symptoms     Try to avoid being in loud places or around loud noises. Wear earplugs or headphones when you're around loud noises. Find ways to reduce stress. These may include meditation, yoga, or deep breathing. Sleep with your head slightly raised. General instructions Take over-the-counter and prescription medicines only as told by your provider. Track the things that cause symptoms (triggers). Try to avoid these things. To stop your tinnitus from getting worse: Do not drink alcohol. Do  not have caffeine. Do not use any products that contain nicotine or tobacco. These products include cigarettes, chewing tobacco, and vaping devices, such as e-cigarettes. If you need help quitting, ask your provider. Avoid using too much salt. Get enough sleep each night. Where to find more information American Tinnitus Association: https://www.johnson-hamilton.org/ Contact a health care provider if: Your symptoms last for 3 weeks or longer without  stopping. You have sudden hearing loss. Your symptoms get worse or don't get better with home care. You can't manage the stress of living with tinnitus. Get help right away if: You get tinnitus after a head injury. You have tinnitus and: Dizziness. Nausea and vomiting. Loss of balance. A sudden, severe headache. Changes to your eyesight. Weakness in your face, arms, or legs. These symptoms may be an emergency. Get help right away. Call 911. Do not wait to see if the symptoms will go away. Do not drive yourself to the hospital. This information is not intended to replace advice given to you by your health care provider. Make sure you discuss any questions you have with your health care provider. Document Revised: 05/23/2022 Document Reviewed: 05/23/2022 Elsevier Patient Education  2024 [employer].
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
## 2023-11-28 ENCOUNTER — Ambulatory Visit: Admitting: Pharmacist

## 2023-11-30 ENCOUNTER — Other Ambulatory Visit: Payer: Self-pay

## 2023-11-30 ENCOUNTER — Other Ambulatory Visit (HOSPITAL_BASED_OUTPATIENT_CLINIC_OR_DEPARTMENT_OTHER): Payer: Self-pay

## 2023-12-04 ENCOUNTER — Other Ambulatory Visit: Payer: Self-pay

## 2023-12-04 ENCOUNTER — Other Ambulatory Visit: Payer: Self-pay | Admitting: Pharmacist

## 2023-12-04 ENCOUNTER — Telehealth: Payer: Self-pay

## 2023-12-04 MED ORDER — LANTUS SOLOSTAR 100 UNIT/ML ~~LOC~~ SOPN
PEN_INJECTOR | SUBCUTANEOUS | 3 refills | Status: DC
  Filled 2023-12-04: qty 30, 30d supply, fill #0

## 2023-12-04 NOTE — Telephone Encounter (Signed)
 Patient had refill available at  [facility]

## 2023-12-05 ENCOUNTER — Other Ambulatory Visit: Payer: Self-pay

## 2023-12-27 ENCOUNTER — Ambulatory Visit: Attending: Family Medicine | Admitting: Family Medicine

## 2023-12-27 ENCOUNTER — Encounter: Payer: Self-pay | Admitting: Family Medicine

## 2023-12-27 ENCOUNTER — Other Ambulatory Visit: Payer: Self-pay

## 2023-12-27 VITALS — BP 134/84 | HR 80 | Temp 98.9°F | Ht 65.0 in | Wt 191.8 lb

## 2023-12-27 DIAGNOSIS — E876 Hypokalemia: Secondary | ICD-10-CM

## 2023-12-27 DIAGNOSIS — Z794 Long term (current) use of insulin: Secondary | ICD-10-CM

## 2023-12-27 DIAGNOSIS — E1169 Type 2 diabetes mellitus with other specified complication: Secondary | ICD-10-CM

## 2023-12-27 DIAGNOSIS — E1159 Type 2 diabetes mellitus with other circulatory complications: Secondary | ICD-10-CM | POA: Diagnosis not present

## 2023-12-27 DIAGNOSIS — Z79899 Other long term (current) drug therapy: Secondary | ICD-10-CM

## 2023-12-27 DIAGNOSIS — Z23 Encounter for immunization: Secondary | ICD-10-CM

## 2023-12-27 DIAGNOSIS — E781 Pure hyperglyceridemia: Secondary | ICD-10-CM

## 2023-12-27 DIAGNOSIS — E785 Hyperlipidemia, unspecified: Secondary | ICD-10-CM

## 2023-12-27 DIAGNOSIS — E1142 Type 2 diabetes mellitus with diabetic polyneuropathy: Secondary | ICD-10-CM

## 2023-12-27 DIAGNOSIS — Z7902 Long term (current) use of antithrombotics/antiplatelets: Secondary | ICD-10-CM

## 2023-12-27 DIAGNOSIS — I152 Hypertension secondary to endocrine disorders: Secondary | ICD-10-CM

## 2023-12-27 LAB — POCT GLYCOSYLATED HEMOGLOBIN (HGB A1C): HbA1c, POC (controlled diabetic range): 8.9 % — AB (ref 0.0–7.0)

## 2023-12-27 MED ORDER — CARVEDILOL 25 MG PO TABS
25.0000 mg | ORAL_TABLET | Freq: Two times a day (BID) | ORAL | 1 refills | Status: AC
  Filled 2023-12-27 – 2024-02-04 (×2): qty 180, 90d supply, fill #0

## 2023-12-27 MED ORDER — BENAZEPRIL HCL 40 MG PO TABS
40.0000 mg | ORAL_TABLET | Freq: Every day | ORAL | 1 refills | Status: AC
  Filled 2023-12-27 – 2024-02-19 (×2): qty 90, 90d supply, fill #0

## 2023-12-27 MED ORDER — LANTUS SOLOSTAR 100 UNIT/ML ~~LOC~~ SOPN
PEN_INJECTOR | SUBCUTANEOUS | 3 refills | Status: AC
  Filled 2023-12-27: qty 30, fill #0
  Filled 2023-12-27: qty 30, 30d supply, fill #0
  Filled 2024-02-04: qty 30, 30d supply, fill #1
  Filled 2024-03-07: qty 30, 30d supply, fill #2
  Filled 2024-04-05: qty 30, 30d supply, fill #3

## 2023-12-27 MED ORDER — POTASSIUM CHLORIDE CRYS ER 20 MEQ PO TBCR
30.0000 meq | EXTENDED_RELEASE_TABLET | Freq: Every day | ORAL | 1 refills | Status: AC
  Filled 2023-12-27 – 2024-02-04 (×2): qty 135, 90d supply, fill #0

## 2023-12-27 MED ORDER — ALBUTEROL SULFATE HFA 108 (90 BASE) MCG/ACT IN AERS
2.0000 | INHALATION_SPRAY | RESPIRATORY_TRACT | 1 refills | Status: AC | PRN
  Filled 2023-12-27: qty 8.5, 17d supply, fill #0
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
## 2023-12-27 NOTE — Patient Instructions (Signed)
 VISIT SUMMARY:  Today, we discussed the management of your blood sugar levels, which have been fluctuating, especially with nocturnal hypoglycemic episodes and elevated morning levels. We also reviewed your cholesterol levels and general health maintenance needs.  YOUR PLAN:  -TYPE 2 DIABETES MELLITUS WITH DIABETIC POLYNEUROPATHY, HYPERGLYCEMIA, AND HYPOGLYCEMIA: This condition means that your body has difficulty managing blood sugar levels, leading to nerve damage, high blood sugar, and low blood sugar episodes. We will increase your evening insulin  glargine to 30 units and continue your current dose of glimepiride . Please monitor your blood glucose levels, especially in the morning and at night, and avoid late-night eating to help manage your levels.  -HYPERTRIGLYCERIDEMIA: This condition means you have elevated levels of triglycerides, a type of fat in your blood. We recommend resuming fish oil at 1 gram twice daily and considering odorless options. Continue taking atorvastatin  as prescribed.  -GENERAL HEALTH MAINTENANCE: You are due for routine labs to check your kidney function, liver function, and complete blood count. You also need a flu shot, which we will administer today. Your eye care will continue to be managed through the [facility].  INSTRUCTIONS:  Please follow up with the recommended blood tests and ensure you get your flu shot today. Continue monitoring your blood sugar levels closely and make the suggested dietary changes. If you have any concerns or experience any issues, please contact our office.

## 2023-12-29 ENCOUNTER — Ambulatory Visit: Payer: Self-pay | Admitting: Family Medicine

## 2023-12-29 LAB — CBC WITH DIFFERENTIAL/PLATELET
Basophils Absolute: 0 x10E3/uL (ref 0.0–0.2)
Basos: 0 %
EOS (ABSOLUTE): 0.2 x10E3/uL (ref 0.0–0.4)
Eos: 3 %
Hematocrit: 40.3 % (ref 34.0–46.6)
Hemoglobin: 13.3 g/dL (ref 11.1–15.9)
Immature Grans (Abs): 0 x10E3/uL (ref 0.0–0.1)
Immature Granulocytes: 0 %
Lymphocytes Absolute: 3.3 x10E3/uL — ABNORMAL HIGH (ref 0.7–3.1)
Lymphs: 49 %
MCH: 30.7 pg (ref 26.6–33.0)
MCHC: 33 g/dL (ref 31.5–35.7)
MCV: 93 fL (ref 79–97)
Monocytes Absolute: 0.5 x10E3/uL (ref 0.1–0.9)
Monocytes: 8 %
Neutrophils Absolute: 2.7 x10E3/uL (ref 1.4–7.0)
Neutrophils: 40 %
Platelets: 216 x10E3/uL (ref 150–450)
RBC: 4.33 x10E6/uL (ref 3.77–5.28)
RDW: 12.3 % (ref 11.7–15.4)
WBC: 6.7 x10E3/uL (ref 3.4–10.8)

## 2023-12-29 LAB — CMP14+EGFR
ALT: 21 IU/L (ref 0–32)
AST: 25 IU/L (ref 0–40)
Albumin: 4.3 g/dL (ref 3.9–4.9)
Alkaline Phosphatase: 89 IU/L (ref 49–135)
BUN/Creatinine Ratio: 14 (ref 12–28)
BUN: 18 mg/dL (ref 8–27)
Bilirubin Total: 0.4 mg/dL (ref 0.0–1.2)
CO2: 29 mmol/L (ref 20–29)
Calcium: 9.1 mg/dL (ref 8.7–10.3)
Chloride: 100 mmol/L (ref 96–106)
Creatinine, Ser: 1.26 mg/dL — ABNORMAL HIGH (ref 0.57–1.00)
Globulin, Total: 2.6 g/dL (ref 1.5–4.5)
Glucose: 148 mg/dL — ABNORMAL HIGH (ref 70–99)
Potassium: 4.2 mmol/L (ref 3.5–5.2)
Sodium: 141 mmol/L (ref 134–144)
Total Protein: 6.9 g/dL (ref 6.0–8.5)
eGFR: 49 mL/min/1.73 — ABNORMAL LOW (ref >59)

## 2023-12-29 LAB — MICROALBUMIN / CREATININE URINE RATIO
Creatinine, Urine: 157 mg/dL
Microalb/Creat Ratio: 20 mg/g{creat} (ref 0–29)
Microalbumin, Urine: 31.8 ug/mL

## 2024-01-17 ENCOUNTER — Other Ambulatory Visit: Payer: Self-pay

## 2024-01-18 ENCOUNTER — Other Ambulatory Visit: Payer: Self-pay

## 2024-02-05 ENCOUNTER — Ambulatory Visit: Admitting: Family Medicine

## 2024-02-05 ENCOUNTER — Other Ambulatory Visit: Payer: Self-pay

## 2024-02-07 ENCOUNTER — Other Ambulatory Visit: Payer: Self-pay

## 2024-02-14 ENCOUNTER — Other Ambulatory Visit: Payer: Self-pay

## 2024-02-19 ENCOUNTER — Other Ambulatory Visit: Payer: Self-pay

## 2024-03-01 ENCOUNTER — Emergency Department (HOSPITAL_BASED_OUTPATIENT_CLINIC_OR_DEPARTMENT_OTHER)
Admission: EM | Admit: 2024-03-01 | Discharge: 2024-03-01 | Disposition: A | Discharge status: Home / Self Care | Source: Home / Self Care | Attending: Emergency Medicine | Admitting: Emergency Medicine

## 2024-03-01 ENCOUNTER — Encounter (HOSPITAL_BASED_OUTPATIENT_CLINIC_OR_DEPARTMENT_OTHER): Payer: Self-pay

## 2024-03-01 ENCOUNTER — Emergency Department (HOSPITAL_BASED_OUTPATIENT_CLINIC_OR_DEPARTMENT_OTHER)

## 2024-03-01 ENCOUNTER — Other Ambulatory Visit: Payer: Self-pay

## 2024-03-01 DIAGNOSIS — R197 Diarrhea, unspecified: Secondary | ICD-10-CM | POA: Insufficient documentation

## 2024-03-01 DIAGNOSIS — R739 Hyperglycemia, unspecified: Secondary | ICD-10-CM

## 2024-03-01 DIAGNOSIS — I10 Essential (primary) hypertension: Secondary | ICD-10-CM | POA: Insufficient documentation

## 2024-03-01 DIAGNOSIS — Z79899 Other long term (current) drug therapy: Secondary | ICD-10-CM | POA: Insufficient documentation

## 2024-03-01 DIAGNOSIS — R112 Nausea with vomiting, unspecified: Secondary | ICD-10-CM | POA: Diagnosis present

## 2024-03-01 DIAGNOSIS — R509 Fever, unspecified: Secondary | ICD-10-CM | POA: Insufficient documentation

## 2024-03-01 DIAGNOSIS — R519 Headache, unspecified: Secondary | ICD-10-CM | POA: Insufficient documentation

## 2024-03-01 DIAGNOSIS — E1143 Type 2 diabetes mellitus with diabetic autonomic (poly)neuropathy: Secondary | ICD-10-CM | POA: Insufficient documentation

## 2024-03-01 DIAGNOSIS — E1165 Type 2 diabetes mellitus with hyperglycemia: Secondary | ICD-10-CM | POA: Diagnosis not present

## 2024-03-01 DIAGNOSIS — Z794 Long term (current) use of insulin: Secondary | ICD-10-CM | POA: Insufficient documentation

## 2024-03-01 DIAGNOSIS — Z7984 Long term (current) use of oral hypoglycemic drugs: Secondary | ICD-10-CM | POA: Insufficient documentation

## 2024-03-01 LAB — CBC WITH DIFFERENTIAL/PLATELET
Abs Immature Granulocytes: 0.01 K/uL (ref 0.00–0.07)
Basophils Absolute: 0 K/uL (ref 0.0–0.1)
Basophils Relative: 0 %
Eosinophils Absolute: 0.2 K/uL (ref 0.0–0.5)
Eosinophils Relative: 3 %
HCT: 38.2 % (ref 36.0–46.0)
Hemoglobin: 12.8 g/dL (ref 12.0–15.0)
Immature Granulocytes: 0 %
Lymphocytes Relative: 54 %
Lymphs Abs: 3.1 K/uL (ref 0.7–4.0)
MCH: 31.4 pg (ref 26.0–34.0)
MCHC: 33.5 g/dL (ref 30.0–36.0)
MCV: 93.9 fL (ref 80.0–100.0)
Monocytes Absolute: 0.5 K/uL (ref 0.1–1.0)
Monocytes Relative: 8 %
Neutro Abs: 2 K/uL (ref 1.7–7.7)
Neutrophils Relative %: 35 %
Platelets: 207 K/uL (ref 150–400)
RBC: 4.07 MIL/uL (ref 3.87–5.11)
RDW: 12.4 % (ref 11.5–15.5)
WBC: 5.8 K/uL (ref 4.0–10.5)
nRBC: 0 % (ref 0.0–0.2)

## 2024-03-01 LAB — COMPREHENSIVE METABOLIC PANEL WITH GFR
ALT: 25 U/L (ref 0–44)
AST: 24 U/L (ref 15–41)
Albumin: 4.2 g/dL (ref 3.5–5.0)
Alkaline Phosphatase: 96 U/L (ref 38–126)
Anion gap: 8 (ref 5–15)
BUN: 14 mg/dL (ref 8–23)
CO2: 30 mmol/L (ref 22–32)
Calcium: 9.3 mg/dL (ref 8.9–10.3)
Chloride: 101 mmol/L (ref 98–111)
Creatinine, Ser: 1.24 mg/dL — ABNORMAL HIGH (ref 0.44–1.00)
GFR, Estimated: 49 mL/min — ABNORMAL LOW
Glucose, Bld: 250 mg/dL — ABNORMAL HIGH (ref 70–99)
Potassium: 4.4 mmol/L (ref 3.5–5.1)
Sodium: 139 mmol/L (ref 135–145)
Total Bilirubin: 0.4 mg/dL (ref 0.0–1.2)
Total Protein: 7.1 g/dL (ref 6.5–8.1)

## 2024-03-01 LAB — URINALYSIS, MICROSCOPIC (REFLEX): Bacteria, UA: NONE SEEN

## 2024-03-01 LAB — RESP PANEL BY RT-PCR (RSV, FLU A&B, COVID)  RVPGX2
Influenza A by PCR: NEGATIVE
Influenza B by PCR: NEGATIVE
Resp Syncytial Virus by PCR: NEGATIVE
SARS Coronavirus 2 by RT PCR: NEGATIVE

## 2024-03-01 LAB — URINALYSIS, ROUTINE W REFLEX MICROSCOPIC
Bilirubin Urine: NEGATIVE
Glucose, UA: 250 mg/dL — AB
Ketones, ur: NEGATIVE mg/dL
Leukocytes,Ua: NEGATIVE
Nitrite: NEGATIVE
Protein, ur: NEGATIVE mg/dL
Specific Gravity, Urine: 1.025 (ref 1.005–1.030)
pH: 6 (ref 5.0–8.0)

## 2024-03-01 MED ORDER — ONDANSETRON HCL 4 MG/2ML IJ SOLN
4.0000 mg | Freq: Once | INTRAMUSCULAR | Status: AC
  Administered 2024-03-01: 4 mg via INTRAVENOUS
  Filled 2024-03-01: qty 2

## 2024-03-01 MED ORDER — ONDANSETRON 8 MG PO TBDP: 8.0000 mg | ORAL_TABLET | Freq: Three times a day (TID) | ORAL | 0 refills | Status: AC | PRN

## 2024-03-01 MED ORDER — LACTATED RINGERS IV BOLUS
500.0000 mL | Freq: Once | INTRAVENOUS | Status: AC
  Administered 2024-03-01: 500 mL via INTRAVENOUS

## 2024-03-01 NOTE — ED Notes (Signed)
 Discharge instructions, follow up care, and prescriptions reviewed and explained, pt verbalized understanding.

## 2024-03-01 NOTE — Discharge Instructions (Signed)
 Please drink oral rehydration fluids such as Gatorade mixed half-and-half with water and water to ensure that you are urinating at least every couple of hours.  Return to the emergency department if you are worse at any time especially unable to tolerate liquids, or having severe abdominal pain or high fevers. Please recheck with your doctor on Monday if you are not well.

## 2024-03-01 NOTE — ED Triage Notes (Signed)
 Complaining of body aches, feeling generally bad that started last Tuesday. She has had vomiting since Wednesday and yesterday started with diarrhea.

## 2024-03-01 NOTE — ED Provider Notes (Signed)
 " [facility] Provider Note   CSN: [id number] Arrival date & time: 03/01/24  9248     Patient presents with: Abdominal Pain   Kelly Santana is a 64 y.o. female.   HPI 64 year old female history of insulin -dependent diabetes, hypertension, gastroparesis, hyperlipidemia, on subjective fever, chills, pain, nausea, vomiting, diarrhea, headache, for 2 days.  States she had some beginning of symptoms Tuesday night and felt hot and cold all night.  Wednesday at work she was nauseated and vomited several times.  On Thursday she had a headache and had diarrhea.  Decreased p.o. intake yesterday.  She has not had anything to eat or drink today.  She denies any UTI symptoms such as frequency or pain with urination.  She does endorse some flu contacts in family and at work.    Prior to Admission medications  Medication Sig Start Date End Date Taking? Authorizing Provider  ondansetron  (ZOFRAN -ODT) 8 MG disintegrating tablet Take 1 tablet (8 mg total) by mouth every 8 (eight) hours as needed for nausea or vomiting. 03/01/24  Yes Levander Houston, MD  albuterol  (VENTOLIN  HFA) 108 [phone] Base) MCG/ACT inhaler Inhale 2 puffs into the lungs every 4 (four) hours as needed for wheezing or shortness of breath. 12/27/23   Newlin, Enobong, MD  atorvastatin  (LIPITOR) 40 MG tablet Take 1 tablet (40 mg total) by mouth once daily. 09/05/23   Newlin, Enobong, MD  benazepril  (LOTENSIN ) 40 MG tablet Take 1 tablet (40 mg total) by mouth daily. 12/27/23   Newlin, Enobong, MD  blood glucose meter kit and supplies KIT Dispense based on patient and insurance preference. Use up to four times daily as directed. (FOR ICD-9 250.00, 250.01). 04/18/15   Regalado, Belkys A, MD  carvedilol  (COREG ) 25 MG tablet Take 1 tablet (25 mg total) by mouth 2 (two) times daily with a meal. 12/27/23   Delbert Clam, MD  Continuous Blood Gluc Receiver (FREESTYLE LIBRE 2 READER) DEVI Use as directed to check blood  sugar three times daily 08/09/21   Newlin, Enobong, MD  Continuous Blood Gluc Sensor (FREESTYLE LIBRE 2 SENSOR) MISC Use as directed to check blood sugar three times daily. Change sensor once every 14 days. 08/09/21   Newlin, Enobong, MD  cyclobenzaprine  (FLEXERIL ) 10 MG tablet Take 1 tablet (10 mg total) by mouth 2 (two) times daily as needed for muscle spasms. 04/05/22   Newlin, Enobong, MD  diclofenac  Sodium (VOLTAREN ) 1 % GEL Apply 4 g topically 4 (four) times daily. 02/03/21   Newlin, Enobong, MD  DULoxetine  (CYMBALTA ) 30 MG capsule Take 1 capsule (30 mg total) by mouth daily. 02/08/22 12/27/23  Urbano Albright, MD  fluticasone  (FLONASE ) 50 MCG/ACT nasal spray Place 2 sprays into both nostrils daily. 08/21/18   Newlin, Enobong, MD  gabapentin  (NEURONTIN ) 300 MG capsule Take 2 capsules (600 mg total) by mouth 2 (two) times daily. 09/05/23   Newlin, Enobong, MD  glimepiride  (AMARYL ) 4 MG tablet Take 1 tablet (4 mg total) by mouth 2 (two) times daily. 09/05/23   Newlin, Enobong, MD  glucose blood (RELION GLUCOSE TEST STRIPS) test strip Use as instructed 05/24/16   Newlin, Enobong, MD  hydrochlorothiazide  (HYDRODIURIL ) 25 MG tablet Take 1 tablet (25 mg total) by mouth daily. 09/05/23   Newlin, Enobong, MD  icosapent  Ethyl (VASCEPA ) 1 g capsule Take 2 capsules (2 g total) by mouth 2 (two) times daily. Patient not taking: Reported on 12/27/2023 09/12/22   Newlin, Enobong, MD  insulin  glargine (LANTUS   SOLOSTAR) 100 UNIT/ML Solostar Pen Inject 70 Units into the skin in the morning AND 30 Units every evening. Increase by 2 units every fourth day until blood sugars are at goal. 12/27/23   Newlin, Enobong, MD  insulin  lispro (HUMALOG  KWIKPEN) 100 UNIT/ML KwikPen Inject 0-12 Units into the skin 3 (three) times daily. 08/08/23   Newlin, Enobong, MD  Insulin  Pen Needle (TRUEPLUS 5-BEVEL PEN NEEDLES) 31G X 5 MM MISC Use 5 times daily as needed with Insulin  Glargine and Humalog . 03/03/23   Newlin, Enobong, MD  Insulin   Syringe-Needle U-100 31G X 5/16 1 ML MISC 1 each by Does not apply route 4 (four) times daily. 10/04/16   Newlin, Enobong, MD  ipratropium-albuterol  (DUONEB) 0.5-2.5 (3) MG/3ML SOLN TAKE 3 MLS BY NEBULIZATION EVERY 6 (SIX) HOURS AS NEEDED. 03/26/20 12/27/23  Newlin, Enobong, MD  loratadine  (CLARITIN ) 10 MG tablet Take 1 tablet (10 mg total) by mouth daily. 08/21/18   Newlin, Enobong, MD  magnesium  oxide (MAG-OX) 400 MG tablet Take 1 tablet (400 mg total) by mouth in the morning and at bedtime. 05/26/22   Newlin, Enobong, MD  metoCLOPramide  (REGLAN ) 10 MG tablet Take 1 tablet (10 mg total) by mouth every 8 (eight) hours as needed for nausea. 09/05/23   Newlin, Enobong, MD  mometasone -formoterol  (DULERA ) 100-5 MCG/ACT AERO INHALE 2 PUFFS INTO THE LUNGS 2 (TWO) TIMES DAILY. 06/02/20 12/27/23  Newlin, Enobong, MD  Multiple Vitamin (MULTIVITAMIN WITH MINERALS) TABS tablet Take 1 tablet by mouth daily.    [provider]  omeprazole  (PRILOSEC) 20 MG capsule Take 1 capsule (20 mg total) by mouth daily. 02/03/21   Newlin, Enobong, MD  potassium chloride  SA (KLOR-CON  M) 20 MEQ tablet Take 1.5 tablets (30 mEq total) by mouth daily. 12/27/23   Newlin, Enobong, MD  RELION LANCETS MICRO-THIN 33G MISC 1 each by Does not apply route at bedtime. 07/24/15   Newlin, Enobong, MD  spironolactone  (ALDACTONE ) 25 MG tablet Take 1 tablet (25 mg total) by mouth daily. 09/05/23   Newlin, Enobong, MD  Syringe/Needle, Disp, (SYRINGE 3CC/21GX1-1/4) 21G X 1-1/4 3 ML MISC Inject 1 application as directed daily. 04/18/15   Regalado, Owen LABOR, MD    Allergies: Invokamet [canagliflozin-metformin hcl], Sulfonamide derivatives, and Ibuprofen    Review of Systems  Updated Vital Signs BP (!) 173/86 (BP Location: Right Arm)   Pulse 79   Temp 97.8 F (36.6 C) (Oral)   Resp 18   Ht 1.651 m (5' 5)   Wt 85.7 kg   SpO2 99%   BMI 31.45 kg/m   Physical Exam Vitals reviewed.  Constitutional:      General: She is not in acute  distress.    Appearance: She is well-developed. She is not ill-appearing.  HENT:     Head: Normocephalic.     Mouth/Throat:     Mouth: Mucous membranes are moist.  Eyes:     Extraocular Movements: Extraocular movements intact.  Cardiovascular:     Rate and Rhythm: Normal rate and regular rhythm.  Pulmonary:     Effort: Pulmonary effort is normal.     Breath sounds: Normal breath sounds.  Abdominal:     General: Abdomen is flat. Bowel sounds are normal. There is no distension.     Palpations: Abdomen is soft.     Tenderness: There is no abdominal tenderness.  Skin:    General: Skin is warm and dry.  Neurological:     Mental Status: She is alert.     (all  labs ordered are listed, but only abnormal results are displayed) Labs Reviewed  COMPREHENSIVE METABOLIC PANEL WITH GFR - Abnormal; Notable for the following components:      Result Value   Glucose, Bld 250 (*)    Creatinine, Ser 1.24 (*)    GFR, Estimated 49 (*)    All other components within normal limits  URINALYSIS, ROUTINE W REFLEX MICROSCOPIC - Abnormal; Notable for the following components:   Color, Urine STRAW (*)    Glucose, UA 250 (*)    Hgb urine dipstick TRACE (*)    All other components within normal limits  RESP PANEL BY RT-PCR (RSV, FLU A&B, COVID)  [id number]  CBC WITH DIFFERENTIAL/PLATELET  URINALYSIS, MICROSCOPIC (REFLEX)  CBG MONITORING, ED    EKG: None  Radiology: DG Chest Port 1 View Result Date: 03/01/2024 CLINICAL DATA:  Fever.  Body ache.  Malaise.  Vomiting.  Diarrhea. EXAM: PORTABLE CHEST - 1 VIEW COMPARISON:  06/29/2021 FINDINGS: Cardiomediastinal silhouette and pulmonary vasculature are within normal limits. Lungs are clear. IMPRESSION: No acute cardiopulmonary process. Electronically Signed   By: Aliene Lloyd M.D.   On: 03/01/2024 09:17     Procedures   Medications Ordered in the ED  lactated ringers bolus 500 mL (0 mLs Intravenous Stopped 03/01/24 0902)  ondansetron  (ZOFRAN ) injection 4 mg  (4 mg Intravenous Given 03/01/24 0907)    Clinical Course as of 03/01/24 0921  Fri Mar 01, 2024  0847 CBC is reviewed and interpreted and within normal limits [DR]  0847 Urinalysis is reviewed interpreted no evidence of acute infection noted [DR]  0848 Chest x-Cauy Melody reviewed interpreted and some haziness noted of the left base otherwise appears to be within normal limits and awaiting radiologist interpretation [DR]    Clinical Course User Index [DR] Levander Houston, MD                                 Medical Decision Making Amount and/or Complexity of Data Reviewed Labs: ordered. Radiology: ordered.  Risk Prescription drug management.   Labs reviewed and significant only for slightly elevated blood sugar at 250 and known diabetic.  She has received IV fluids and antiemetics.  She now remembers that she did drink coffee this morning and kept that down.  She has had no vomiting here.  She feels improved and states that she feels that she is at the tail end of this illness.  She does state that she needs a note for work. Differential diagnosis includes but is not limited to viral syndrome, acute intra-abdominal etiology, gastritis, DKA.  Patient was evaluated here with labs and physical exam.  No evidence of flu, normal white blood cell count, normal electrolytes, Physical exam reveals a soft abdomen and I have a low concern for acute intra-abdominal etiology such as Coley cystitis or appendicitis.  Doubt UTI I am based on symptoms and urinalysis. Patient appears clinically well.  She is advised of return precautions and need for follow-up and voices understanding     Final diagnoses:  Nausea vomiting and diarrhea  Hyperglycemia    ED Discharge Orders          Ordered    ondansetron  (ZOFRAN -ODT) 8 MG disintegrating tablet  Every 8 hours PRN        03/01/24 0921               Levander Houston, MD 03/01/24 [phone]  "

## 2024-03-07 ENCOUNTER — Other Ambulatory Visit: Payer: Self-pay | Admitting: Family Medicine

## 2024-03-08 ENCOUNTER — Other Ambulatory Visit: Payer: Self-pay

## 2024-03-08 MED ORDER — TECHLITE PEN NEEDLES 31G X 5 MM MISC
6 refills | Status: AC
  Filled 2024-03-08: qty 100, 20d supply, fill #0
  Filled 2024-03-28: qty 100, 20d supply, fill #1

## 2024-03-11 ENCOUNTER — Other Ambulatory Visit: Payer: Self-pay

## 2024-03-27 ENCOUNTER — Ambulatory Visit: Admitting: Family Medicine

## 2024-03-29 ENCOUNTER — Other Ambulatory Visit: Payer: Self-pay

## 2024-04-03 ENCOUNTER — Other Ambulatory Visit: Payer: Self-pay

## 2024-04-17 ENCOUNTER — Ambulatory Visit: Admitting: Family Medicine
